# Patient Record
Sex: Male | Born: 1935 | Race: Black or African American | Hispanic: No | Marital: Married | Smoking: Current every day smoker
Health system: Southern US, Community
[De-identification: ages and names within clinical notes are randomized; demographics above are authoritative.]

## PROBLEM LIST (undated history)

## (undated) DIAGNOSIS — R590 Localized enlarged lymph nodes: Secondary | ICD-10-CM

## (undated) DIAGNOSIS — N189 Chronic kidney disease, unspecified: Secondary | ICD-10-CM

## (undated) DIAGNOSIS — N1330 Unspecified hydronephrosis: Principal | ICD-10-CM

## (undated) DIAGNOSIS — C61 Malignant neoplasm of prostate: Secondary | ICD-10-CM

## (undated) DIAGNOSIS — I422 Other hypertrophic cardiomyopathy: Secondary | ICD-10-CM

## (undated) DIAGNOSIS — E785 Hyperlipidemia, unspecified: Secondary | ICD-10-CM

## (undated) DIAGNOSIS — R911 Solitary pulmonary nodule: Secondary | ICD-10-CM

## (undated) DIAGNOSIS — Z7901 Long term (current) use of anticoagulants: Secondary | ICD-10-CM

## (undated) DIAGNOSIS — I10 Essential (primary) hypertension: Secondary | ICD-10-CM

## (undated) DIAGNOSIS — I872 Venous insufficiency (chronic) (peripheral): Secondary | ICD-10-CM

## (undated) HISTORY — DX: Hyperlipidemia, unspecified: E78.5

## (undated) HISTORY — DX: Malignant neoplasm of prostate: C61

## (undated) HISTORY — DX: Localized enlarged lymph nodes: R59.0

## (undated) HISTORY — DX: Unspecified hydronephrosis: N13.30

## (undated) HISTORY — DX: Solitary pulmonary nodule: R91.1

## (undated) HISTORY — DX: Long term (current) use of anticoagulants: Z79.01

## (undated) HISTORY — DX: Venous insufficiency (chronic) (peripheral): I87.2

## (undated) HISTORY — DX: Other hypertrophic cardiomyopathy: I42.2

## (undated) HISTORY — DX: Essential (primary) hypertension: I10

## (undated) HISTORY — DX: Chronic kidney disease, unspecified: N18.9

---

## 1998-01-22 ENCOUNTER — Encounter (HOSPITAL_COMMUNITY): Admission: RE | Admit: 1998-01-22 | Discharge: 1998-04-22 | Payer: Self-pay | Admitting: Cardiology

## 1998-04-14 ENCOUNTER — Emergency Department (HOSPITAL_COMMUNITY): Admission: EM | Admit: 1998-04-14 | Discharge: 1998-04-14 | Payer: Self-pay | Admitting: Emergency Medicine

## 1999-04-29 ENCOUNTER — Emergency Department (HOSPITAL_COMMUNITY): Admission: EM | Admit: 1999-04-29 | Discharge: 1999-04-29 | Payer: Self-pay | Admitting: Emergency Medicine

## 2000-02-27 ENCOUNTER — Encounter (INDEPENDENT_AMBULATORY_CARE_PROVIDER_SITE_OTHER): Payer: Self-pay

## 2000-02-27 ENCOUNTER — Other Ambulatory Visit: Admission: RE | Admit: 2000-02-27 | Discharge: 2000-02-27 | Payer: Self-pay | Admitting: Urology

## 2004-03-18 ENCOUNTER — Ambulatory Visit: Payer: Self-pay

## 2004-04-15 ENCOUNTER — Ambulatory Visit: Payer: Self-pay | Admitting: Cardiology

## 2004-05-13 ENCOUNTER — Ambulatory Visit: Payer: Self-pay | Admitting: Cardiovascular Disease

## 2004-06-10 ENCOUNTER — Ambulatory Visit: Payer: Self-pay | Admitting: Cardiology

## 2004-07-01 ENCOUNTER — Ambulatory Visit: Payer: Self-pay | Admitting: Cardiology

## 2004-07-29 ENCOUNTER — Ambulatory Visit: Payer: Self-pay | Admitting: Cardiology

## 2004-08-26 ENCOUNTER — Ambulatory Visit: Payer: Self-pay | Admitting: Internal Medicine

## 2004-09-23 ENCOUNTER — Ambulatory Visit: Payer: Self-pay | Admitting: Cardiology

## 2004-10-21 ENCOUNTER — Ambulatory Visit: Payer: Self-pay | Admitting: Cardiology

## 2004-10-28 ENCOUNTER — Ambulatory Visit: Payer: Self-pay | Admitting: Cardiology

## 2004-11-16 ENCOUNTER — Ambulatory Visit (HOSPITAL_COMMUNITY): Admission: RE | Admit: 2004-11-16 | Discharge: 2004-11-16 | Payer: Self-pay | Admitting: Urology

## 2004-11-18 ENCOUNTER — Ambulatory Visit: Payer: Self-pay | Admitting: Cardiovascular Disease

## 2004-12-16 ENCOUNTER — Ambulatory Visit: Payer: Self-pay | Admitting: Internal Medicine

## 2005-01-13 ENCOUNTER — Ambulatory Visit: Payer: Self-pay | Admitting: Internal Medicine

## 2005-02-10 ENCOUNTER — Ambulatory Visit: Payer: Self-pay | Admitting: Internal Medicine

## 2005-03-10 ENCOUNTER — Ambulatory Visit: Payer: Self-pay | Admitting: Cardiology

## 2005-04-07 ENCOUNTER — Ambulatory Visit: Payer: Self-pay | Admitting: Cardiology

## 2005-05-05 ENCOUNTER — Ambulatory Visit: Payer: Self-pay | Admitting: Cardiology

## 2005-06-02 ENCOUNTER — Ambulatory Visit: Payer: Self-pay | Admitting: Cardiology

## 2005-06-30 ENCOUNTER — Ambulatory Visit: Payer: Self-pay | Admitting: Cardiology

## 2005-07-28 ENCOUNTER — Ambulatory Visit: Payer: Self-pay | Admitting: Cardiology

## 2005-08-25 ENCOUNTER — Ambulatory Visit: Payer: Self-pay | Admitting: Cardiology

## 2005-09-22 ENCOUNTER — Ambulatory Visit: Payer: Self-pay | Admitting: Cardiology

## 2005-10-20 ENCOUNTER — Ambulatory Visit: Payer: Self-pay | Admitting: Cardiovascular Disease

## 2005-11-17 ENCOUNTER — Ambulatory Visit: Payer: Self-pay | Admitting: Cardiology

## 2005-12-15 ENCOUNTER — Ambulatory Visit: Payer: Self-pay | Admitting: Cardiology

## 2006-01-12 ENCOUNTER — Ambulatory Visit: Payer: Self-pay | Admitting: Internal Medicine

## 2006-02-16 ENCOUNTER — Ambulatory Visit: Payer: Self-pay | Admitting: Cardiology

## 2006-03-09 ENCOUNTER — Ambulatory Visit: Payer: Self-pay | Admitting: Cardiology

## 2006-04-06 ENCOUNTER — Ambulatory Visit: Payer: Self-pay | Admitting: Cardiology

## 2006-05-04 ENCOUNTER — Ambulatory Visit: Payer: Self-pay | Admitting: Cardiology

## 2006-06-01 ENCOUNTER — Ambulatory Visit: Payer: Self-pay | Admitting: Internal Medicine

## 2006-06-28 ENCOUNTER — Ambulatory Visit: Payer: Self-pay | Admitting: *Deleted

## 2006-07-26 ENCOUNTER — Ambulatory Visit: Payer: Self-pay | Admitting: Cardiology

## 2006-08-23 ENCOUNTER — Ambulatory Visit: Payer: Self-pay | Admitting: Cardiology

## 2006-09-16 ENCOUNTER — Ambulatory Visit: Payer: Self-pay | Admitting: Cardiology

## 2006-09-16 ENCOUNTER — Inpatient Hospital Stay (HOSPITAL_COMMUNITY): Admission: EM | Admit: 2006-09-16 | Discharge: 2006-09-18 | Payer: Self-pay | Admitting: Emergency Medicine

## 2006-09-17 ENCOUNTER — Encounter: Payer: Self-pay | Admitting: Cardiology

## 2006-09-20 ENCOUNTER — Ambulatory Visit: Payer: Self-pay | Admitting: Cardiology

## 2006-09-28 ENCOUNTER — Ambulatory Visit: Payer: Self-pay

## 2006-09-28 ENCOUNTER — Ambulatory Visit: Payer: Self-pay | Admitting: Cardiology

## 2006-10-04 ENCOUNTER — Ambulatory Visit: Payer: Self-pay | Admitting: Cardiovascular Disease

## 2006-10-17 ENCOUNTER — Ambulatory Visit: Payer: Self-pay | Admitting: Cardiology

## 2006-10-31 ENCOUNTER — Ambulatory Visit: Payer: Self-pay | Admitting: Cardiology

## 2006-11-21 ENCOUNTER — Ambulatory Visit: Payer: Self-pay | Admitting: Cardiology

## 2006-12-19 ENCOUNTER — Ambulatory Visit: Payer: Self-pay | Admitting: Cardiology

## 2007-01-16 ENCOUNTER — Ambulatory Visit: Payer: Self-pay | Admitting: Internal Medicine

## 2007-02-13 ENCOUNTER — Ambulatory Visit: Payer: Self-pay | Admitting: Cardiovascular Disease

## 2007-03-13 ENCOUNTER — Ambulatory Visit: Payer: Self-pay | Admitting: Cardiology

## 2007-04-10 ENCOUNTER — Ambulatory Visit: Payer: Self-pay | Admitting: Cardiology

## 2007-04-17 ENCOUNTER — Ambulatory Visit: Payer: Self-pay | Admitting: Cardiovascular Disease

## 2007-04-30 ENCOUNTER — Ambulatory Visit: Payer: Self-pay | Admitting: Internal Medicine

## 2007-05-14 ENCOUNTER — Ambulatory Visit: Payer: Self-pay | Admitting: Cardiovascular Disease

## 2007-06-04 ENCOUNTER — Ambulatory Visit: Payer: Self-pay | Admitting: Cardiovascular Disease

## 2007-06-25 ENCOUNTER — Ambulatory Visit: Payer: Self-pay | Admitting: Cardiology

## 2007-07-09 ENCOUNTER — Ambulatory Visit: Payer: Self-pay | Admitting: Cardiology

## 2007-07-12 ENCOUNTER — Ambulatory Visit: Payer: Self-pay | Admitting: Cardiology

## 2007-07-12 ENCOUNTER — Ambulatory Visit: Payer: Self-pay

## 2007-07-30 ENCOUNTER — Ambulatory Visit: Payer: Self-pay | Admitting: Internal Medicine

## 2007-08-08 ENCOUNTER — Ambulatory Visit: Payer: Self-pay | Admitting: Internal Medicine

## 2007-08-22 ENCOUNTER — Ambulatory Visit: Payer: Self-pay | Admitting: Internal Medicine

## 2007-09-09 ENCOUNTER — Ambulatory Visit: Payer: Self-pay | Admitting: Cardiology

## 2007-10-04 ENCOUNTER — Ambulatory Visit: Payer: Self-pay | Admitting: Cardiovascular Disease

## 2007-10-30 ENCOUNTER — Ambulatory Visit: Payer: Self-pay | Admitting: Internal Medicine

## 2007-11-06 ENCOUNTER — Ambulatory Visit: Payer: Self-pay | Admitting: Internal Medicine

## 2007-11-13 ENCOUNTER — Ambulatory Visit: Payer: Self-pay | Admitting: Cardiology

## 2007-11-22 ENCOUNTER — Ambulatory Visit: Payer: Self-pay | Admitting: Cardiology

## 2007-12-06 ENCOUNTER — Ambulatory Visit: Payer: Self-pay | Admitting: Cardiology

## 2007-12-06 LAB — CONVERTED CEMR LAB: INR: 5.5 (ref 0.8–1.0)

## 2007-12-11 ENCOUNTER — Ambulatory Visit: Payer: Self-pay | Admitting: Cardiology

## 2007-12-18 ENCOUNTER — Ambulatory Visit: Payer: Self-pay | Admitting: Cardiology

## 2007-12-30 ENCOUNTER — Ambulatory Visit: Payer: Self-pay | Admitting: Cardiology

## 2008-01-07 ENCOUNTER — Ambulatory Visit: Payer: Self-pay | Admitting: Cardiology

## 2008-01-07 ENCOUNTER — Encounter: Admission: RE | Admit: 2008-01-07 | Discharge: 2008-01-07 | Payer: Self-pay | Admitting: General Surgery

## 2008-01-14 ENCOUNTER — Ambulatory Visit: Payer: Self-pay | Admitting: Cardiology

## 2008-01-21 ENCOUNTER — Ambulatory Visit: Payer: Self-pay | Admitting: Cardiology

## 2008-01-28 ENCOUNTER — Ambulatory Visit: Payer: Self-pay | Admitting: Cardiology

## 2008-02-07 ENCOUNTER — Ambulatory Visit: Payer: Self-pay | Admitting: Cardiology

## 2008-02-14 ENCOUNTER — Ambulatory Visit: Payer: Self-pay | Admitting: Cardiology

## 2008-02-24 ENCOUNTER — Ambulatory Visit: Payer: Self-pay | Admitting: Cardiology

## 2008-03-02 ENCOUNTER — Ambulatory Visit: Payer: Self-pay | Admitting: Cardiovascular Disease

## 2008-03-09 ENCOUNTER — Ambulatory Visit: Payer: Self-pay | Admitting: Cardiology

## 2008-03-18 ENCOUNTER — Ambulatory Visit: Payer: Self-pay | Admitting: Cardiology

## 2008-03-27 ENCOUNTER — Ambulatory Visit: Payer: Self-pay | Admitting: Internal Medicine

## 2008-04-03 ENCOUNTER — Ambulatory Visit: Payer: Self-pay | Admitting: Internal Medicine

## 2008-04-14 ENCOUNTER — Ambulatory Visit: Payer: Self-pay | Admitting: Internal Medicine

## 2008-04-28 ENCOUNTER — Ambulatory Visit: Payer: Self-pay | Admitting: Cardiovascular Disease

## 2008-05-08 ENCOUNTER — Ambulatory Visit: Payer: Self-pay | Admitting: Cardiology

## 2008-05-22 ENCOUNTER — Ambulatory Visit: Payer: Self-pay | Admitting: Internal Medicine

## 2008-06-01 ENCOUNTER — Ambulatory Visit: Payer: Self-pay | Admitting: Cardiology

## 2008-06-15 ENCOUNTER — Ambulatory Visit: Payer: Self-pay | Admitting: Internal Medicine

## 2008-06-29 ENCOUNTER — Ambulatory Visit: Payer: Self-pay | Admitting: Cardiology

## 2008-07-07 DIAGNOSIS — I421 Obstructive hypertrophic cardiomyopathy: Secondary | ICD-10-CM | POA: Insufficient documentation

## 2008-07-08 ENCOUNTER — Encounter: Payer: Self-pay | Admitting: Cardiology

## 2008-07-08 ENCOUNTER — Ambulatory Visit: Payer: Self-pay | Admitting: Cardiology

## 2008-07-08 LAB — CONVERTED CEMR LAB
BUN: 17 mg/dL (ref 6–23)
Basophils Relative: 0.5 % (ref 0.0–3.0)
CO2: 29 meq/L (ref 19–32)
Calcium: 9.5 mg/dL (ref 8.4–10.5)
Chloride: 105 meq/L (ref 96–112)
Creatinine, Ser: 1.1 mg/dL (ref 0.4–1.5)
Eosinophils Absolute: 0.7 10*3/uL (ref 0.0–0.7)
GFR calc Af Amer: 83 mL/min
GFR calc non Af Amer: 69 mL/min
HCT: 35.2 % — ABNORMAL LOW (ref 39.0–52.0)
Hemoglobin: 11.8 g/dL — ABNORMAL LOW (ref 13.0–17.0)
Lymphocytes Relative: 19.7 % (ref 12.0–46.0)
MCHC: 33.4 g/dL (ref 30.0–36.0)
MCV: 98.6 fL (ref 78.0–100.0)
Sodium: 141 meq/L (ref 135–145)
TSH: 1.78 microintl units/mL (ref 0.35–5.50)
WBC: 10.3 10*3/uL (ref 4.5–10.5)

## 2008-07-13 ENCOUNTER — Ambulatory Visit: Payer: Self-pay | Admitting: Cardiology

## 2008-08-03 ENCOUNTER — Ambulatory Visit: Payer: Self-pay | Admitting: Cardiology

## 2008-08-12 ENCOUNTER — Ambulatory Visit: Payer: Self-pay | Admitting: Internal Medicine

## 2008-08-24 ENCOUNTER — Ambulatory Visit: Payer: Self-pay | Admitting: Cardiology

## 2008-09-07 ENCOUNTER — Ambulatory Visit: Payer: Self-pay | Admitting: Cardiovascular Disease

## 2008-09-21 ENCOUNTER — Ambulatory Visit: Payer: Self-pay | Admitting: Cardiovascular Disease

## 2008-09-29 ENCOUNTER — Encounter: Payer: Self-pay | Admitting: *Deleted

## 2008-09-30 ENCOUNTER — Ambulatory Visit: Payer: Self-pay | Admitting: Cardiology

## 2008-09-30 LAB — CONVERTED CEMR LAB: Protime: 17.6

## 2008-10-14 ENCOUNTER — Ambulatory Visit: Payer: Self-pay | Admitting: Cardiology

## 2008-10-26 ENCOUNTER — Ambulatory Visit: Payer: Self-pay | Admitting: Cardiology

## 2008-11-04 ENCOUNTER — Encounter: Payer: Self-pay | Admitting: *Deleted

## 2008-11-04 ENCOUNTER — Ambulatory Visit: Payer: Self-pay | Admitting: Internal Medicine

## 2008-11-04 LAB — CONVERTED CEMR LAB: Prothrombin Time: 16.5 s

## 2008-11-18 ENCOUNTER — Ambulatory Visit: Payer: Self-pay | Admitting: Cardiology

## 2008-11-18 LAB — CONVERTED CEMR LAB
POC INR: 2.1
Prothrombin Time: 17.8 s

## 2008-12-03 ENCOUNTER — Ambulatory Visit: Payer: Self-pay | Admitting: Cardiology

## 2008-12-03 LAB — CONVERTED CEMR LAB: POC INR: 2

## 2008-12-14 ENCOUNTER — Encounter: Payer: Self-pay | Admitting: Cardiovascular Disease

## 2008-12-18 ENCOUNTER — Ambulatory Visit: Payer: Self-pay | Admitting: Cardiology

## 2008-12-29 ENCOUNTER — Ambulatory Visit: Payer: Self-pay | Admitting: Cardiology

## 2008-12-29 LAB — CONVERTED CEMR LAB: POC INR: 2.3

## 2009-01-12 ENCOUNTER — Ambulatory Visit: Payer: Self-pay | Admitting: Cardiology

## 2009-02-02 ENCOUNTER — Ambulatory Visit: Payer: Self-pay | Admitting: Cardiology

## 2009-03-02 ENCOUNTER — Ambulatory Visit: Payer: Self-pay | Admitting: Internal Medicine

## 2009-03-02 LAB — CONVERTED CEMR LAB: POC INR: 1.5

## 2009-03-19 ENCOUNTER — Ambulatory Visit: Payer: Self-pay | Admitting: Cardiology

## 2009-03-19 LAB — CONVERTED CEMR LAB: POC INR: 2.1

## 2009-04-14 ENCOUNTER — Ambulatory Visit: Payer: Self-pay | Admitting: Cardiology

## 2009-05-05 ENCOUNTER — Encounter (INDEPENDENT_AMBULATORY_CARE_PROVIDER_SITE_OTHER): Payer: Self-pay | Admitting: Cardiology

## 2009-05-05 ENCOUNTER — Ambulatory Visit: Payer: Self-pay | Admitting: Cardiovascular Disease

## 2009-05-26 ENCOUNTER — Ambulatory Visit: Payer: Self-pay | Admitting: Cardiology

## 2009-06-09 ENCOUNTER — Ambulatory Visit: Payer: Self-pay | Admitting: Cardiology

## 2009-06-23 ENCOUNTER — Ambulatory Visit: Payer: Self-pay | Admitting: Internal Medicine

## 2009-06-23 LAB — CONVERTED CEMR LAB: POC INR: 2.8

## 2009-07-13 ENCOUNTER — Ambulatory Visit: Payer: Self-pay | Admitting: Cardiology

## 2009-07-13 DIAGNOSIS — L259 Unspecified contact dermatitis, unspecified cause: Secondary | ICD-10-CM

## 2009-07-13 LAB — CONVERTED CEMR LAB: POC INR: 3.1

## 2009-07-15 LAB — CONVERTED CEMR LAB
AST: 19 units/L (ref 0–37)
BUN: 15 mg/dL (ref 6–23)
Calcium: 9.4 mg/dL (ref 8.4–10.5)
Creatinine, Ser: 1.7 mg/dL — ABNORMAL HIGH (ref 0.4–1.5)
Eosinophils Relative: 4.5 % (ref 0.0–5.0)
HCT: 34.7 % — ABNORMAL LOW (ref 39.0–52.0)
Hemoglobin: 11.4 g/dL — ABNORMAL LOW (ref 13.0–17.0)
Lymphocytes Relative: 15.5 % (ref 12.0–46.0)
Lymphs Abs: 1.5 10*3/uL (ref 0.7–4.0)
MCHC: 32.9 g/dL (ref 30.0–36.0)
Neutro Abs: 6.4 10*3/uL (ref 1.4–7.7)
RBC: 3.48 M/uL — ABNORMAL LOW (ref 4.22–5.81)
RDW: 13 % (ref 11.5–14.6)
Sodium: 137 meq/L (ref 135–145)
Total Bilirubin: 0.4 mg/dL (ref 0.3–1.2)
WBC: 9.6 10*3/uL (ref 4.5–10.5)

## 2009-08-03 ENCOUNTER — Ambulatory Visit: Payer: Self-pay | Admitting: Internal Medicine

## 2009-08-03 LAB — CONVERTED CEMR LAB: POC INR: 1.7

## 2009-08-17 ENCOUNTER — Ambulatory Visit: Payer: Self-pay | Admitting: Cardiovascular Disease

## 2009-09-14 ENCOUNTER — Ambulatory Visit: Payer: Self-pay | Admitting: Internal Medicine

## 2009-09-14 LAB — CONVERTED CEMR LAB: POC INR: 2.9

## 2009-10-12 ENCOUNTER — Ambulatory Visit: Payer: Self-pay | Admitting: Cardiovascular Disease

## 2009-11-03 ENCOUNTER — Ambulatory Visit: Payer: Self-pay | Admitting: Internal Medicine

## 2009-11-03 LAB — CONVERTED CEMR LAB: POC INR: 3.7

## 2009-11-17 ENCOUNTER — Ambulatory Visit: Payer: Self-pay | Admitting: Cardiology

## 2009-12-08 ENCOUNTER — Ambulatory Visit: Payer: Self-pay | Admitting: Cardiology

## 2009-12-31 ENCOUNTER — Ambulatory Visit: Payer: Self-pay | Admitting: Cardiology

## 2010-01-28 ENCOUNTER — Ambulatory Visit: Payer: Self-pay | Admitting: Cardiology

## 2010-01-28 LAB — CONVERTED CEMR LAB: POC INR: 3.2

## 2010-02-16 ENCOUNTER — Ambulatory Visit: Payer: Self-pay | Admitting: Internal Medicine

## 2010-02-16 LAB — CONVERTED CEMR LAB
INR: 3.5
POC INR: 3.5

## 2010-03-09 ENCOUNTER — Ambulatory Visit: Payer: Self-pay | Admitting: Cardiovascular Disease

## 2010-03-30 ENCOUNTER — Ambulatory Visit: Payer: Self-pay | Admitting: Internal Medicine

## 2010-03-30 LAB — CONVERTED CEMR LAB: POC INR: 3.1

## 2010-04-27 ENCOUNTER — Ambulatory Visit: Payer: Self-pay | Admitting: Cardiology

## 2010-04-27 LAB — CONVERTED CEMR LAB: POC INR: 2.8

## 2010-05-25 ENCOUNTER — Ambulatory Visit: Admission: RE | Admit: 2010-05-25 | Discharge: 2010-05-25 | Payer: Self-pay | Source: Home / Self Care

## 2010-05-31 NOTE — Assessment & Plan Note (Signed)
Summary: 1 yr    Primary Provider:  Dr. Filbert Schilder  CC:  check up.  History of Present Illness: Roy Blackwell is 77 years old and return for management of hypertrophic cardiomyopathy and venous insufficiency. He has a mild hypertrophic heart myopathy by echocardiogram with an abnormal ECG. He has not had any symptoms related to this and specifically has had no chest pain shortness of breath or palpitations.  His major problem has been severe venous insufficiency of the lower extremities. This is managed by Dr. Zachery Dakins with diuretics and Ace wraps. He is doing reasonably well with this although he does have small ulcers.  His other major problem is a skin eruption. This has gotten much worse recently. I believe this has been diagnosed as a form of eczema. He seen Dr. Mayford Knife in the past for this.  Current Medications (verified): 1)  Coumadin 5 Mg Tabs (Warfarin Sodium) .... Take As Directed By Coumadin Clinic. 2)  Sulfamethoxazole-Tmp Ds 800-160 Mg Tabs (Sulfamethoxazole-Trimethoprim) .Marland Kitchen.. 1 By Mouth Bid 3)  Furosemide 20 Mg Tabs (Furosemide) .... Take 1 Tablet By Mouth Daily.  Allergies: No Known Drug Allergies  Past History:  Past Medical History: Reviewed history from 07/07/2008 and no changes required. Chronic venous insufficiency and stasis ulcers of both lower     extremities.  Mild hypertrophic cardiomyopathy.   Review of Systems       ROS is negative except as outlined in HPI.   Vital Signs:  Patient profile:   77 year old male Height:      68 inches Weight:      154.75 pounds BMI:     23.61 Pulse rate:   64 / minute Resp:     14 per minute BP sitting:   137 / 62  (left arm)  Vitals Entered By: Kem Parkinson (July 13, 2009 11:17 AM)  Physical Exam  Additional Exam:  Gen. Well-nourished, in no distress   Neck: No JVD, thyroid not enlarged, no carotid bruits Lungs: No tachypnea, clear without rales, rhonchi or wheezes Cardiovascular: Rhythm regular, PMI not  displaced,  heart sounds  normal, no murmurs or gallops, there are wraps on both lower extremities with some oozing through the wraps of ulcers but only trace edema, pulses normal in all 4 extremities. Abdomen: BS normal, abdomen soft and non-tender without masses or organomegaly, no hepatosplenomegaly. MS: No deformities, no cyanosis or clubbing   Neuro:  No focal sns   Skin:  generalized maculopapular rash over her extremities and trunk with  changes in pigmentation   Impression & Recommendations:  Problem # 1:  HOCM / IHSS (ICD-425.1) He has a mild hypertrophic cardiomyopathy. He has had no symptoms from this. He does have an abnormal ECG with anterolateral T wave changes but this hasn't changed. We will continue current management. His updated medication list for this problem includes:    Coumadin 5 Mg Tabs (Warfarin sodium) .Marland Kitchen... Take as directed by coumadin clinic.    Furosemide 20 Mg Tabs (Furosemide) .Marland Kitchen... Take 1 tablet by mouth daily.  Orders: EKG w/ Interpretation (93000) TLB-BMP (Basic Metabolic Panel-BMET) (80048-METABOL) TLB-CBC Platelet - w/Differential (85025-CBCD) TLB-Hepatic/Liver Function Pnl (80076-HEPATIC) TLB-TSH (Thyroid Stimulating Hormone) (84443-TSH)  Problem # 2:  VENOUS INSUFFICIENCY, LEGS (ICD-459.81) This is his major problem. Dr. Zachery Dakins is doing an excellent job of managing this. We did not make any changes. We will get laboratory work today since he is on a diuretic. Orders: TLB-BMP (Basic Metabolic Panel-BMET) (80048-METABOL) TLB-CBC Platelet - w/Differential (85025-CBCD) TLB-Hepatic/Liver Function  Pnl (80076-HEPATIC) TLB-TSH (Thyroid Stimulating Hormone) (84443-TSH)  Problem # 3:  CONTACT DERMATITIS&OTHER ECZEMA DUE UNSPEC CAUSE (ICD-692.9) His skin rash is much worse. We will arrange a follow up appointment with Dr. Mayford Knife. Orders: TLB-BMP (Basic Metabolic Panel-BMET) (80048-METABOL) TLB-CBC Platelet - w/Differential  (85025-CBCD) TLB-Hepatic/Liver Function Pnl (80076-HEPATIC) TLB-TSH (Thyroid Stimulating Hormone) (16109-UEA)  Patient Instructions: 1)  Your physician recommends that you have lab work today: cbc/bmet/lft's/tsh (425.1) 2)  Your physician wants you to follow-up in: 1 year with Dr. [name].  You will receive a reminder letter in the mail two months in advance. If you don't receive a letter, please call our office to schedule the follow-up appointment. 3)  You have an appointment scheduled for Tues. 07/27/09 at 12:20pm with Dr. Dorinda Hill. His office number is [phone].

## 2010-05-31 NOTE — Medication Information (Signed)
Summary: rov/sp   Anticoagulant Therapy  Managed by: Weston Brass, PharmD Referring MD: Charlies Constable MD PCP: Dr. Carola Frost MD: Graciela Husbands MD, Viviann Spare Indication 1: Deep Vein Thrombosis - Leg (ICD-451.1) Lab Used: [facility] Site: [facility] INR POC 3.5 INR RANGE 2 - 3  Dietary changes: no    Health status changes: no    Bleeding/hemorrhagic complications: no    Recent/future hospitalizations: no    Any changes in medication regimen? no    Recent/future dental: no  Any missed doses?: no       Is patient compliant with meds? yes       Allergies: No Known Drug Allergies  Anticoagulation Management History:      The patient is taking warfarin and comes in today for a routine follow up visit.  Positive risk factors for bleeding include an age of 75 years or older and presence of serious comorbidities.  The bleeding index is 'intermediate risk'.  Positive CHADS2 values include Age > 57 years old.  The start date was 08/31/1997.  His last INR was 5.5 RATIO and today's INR is 3.5.  Anticoagulation responsible provider: Graciela Husbands MD, Viviann Spare.  INR POC: 3.5.  Cuvette Lot#: [id number].  Exp: 03/2011.    Anticoagulation Management Assessment/Plan:      The patient's current anticoagulation dose is Coumadin 5 mg tabs: Take as directed by coumadin clinic..  The target INR is 2.0-3.0.  The next INR is due 03/09/2010.  Anticoagulation instructions were given to patient.  Results were reviewed/authorized by Weston Brass, PharmD.  He was notified by Haynes Hoehn, PharmD Candidate.         Prior Anticoagulation Instructions: INR 3.2  Skip today's dose of Coumadin then resume same dose of 1 tablet every day except 1 1/2 tablets on Tuesday and Saturday.  Recheck INR in 3 weeks.   Current Anticoagulation Instructions: INR 3.5  Skip today's dose. Then take Coumadin 1 tablet every day of the week, except 1 and 1/2 tablets on Saturday.  Return to clinic in 3 weeks.

## 2010-05-31 NOTE — Medication Information (Signed)
Summary: ROV/SP  Anticoagulant Therapy  Managed by: Weston Brass, PharmD Referring MD: Charlies Constable MD PCP: Dr. Carola Frost MD: Johney Frame MD, Fayrene Fearing Indication 1: Deep Vein Thrombosis - Leg (ICD-451.1) Lab Used: [facility] Site: [facility] INR POC 2.6 INR RANGE 2 - 3  Dietary changes: no    Health status changes: no    Bleeding/hemorrhagic complications: no    Recent/future hospitalizations: no    Any changes in medication regimen? no    Recent/future dental: no  Any missed doses?: no       Is patient compliant with meds? yes       Allergies: No Known Drug Allergies  Anticoagulation Management History:      Positive risk factors for bleeding include an age of 75 years or older and presence of serious comorbidities.  The bleeding index is 'intermediate risk'.  Positive CHADS2 values include Age > 37 years old.  The start date was 08/31/1997.  His last INR was 5.5 RATIO.  Anticoagulation responsible provider: Allred MD, Fayrene Fearing.  INR POC: 2.6.  Exp: 12/2010.    Anticoagulation Management Assessment/Plan:      The patient's current anticoagulation dose is Coumadin 5 mg tabs: Take as directed by coumadin clinic..  The target INR is 2.0-3.0.  The next INR is due 12/08/2009.  Anticoagulation instructions were given to patient.  Results were reviewed/authorized by Weston Brass, PharmD.  He was notified by Weston Brass PharmD.         Prior Anticoagulation Instructions: INR 3.7  Hold Coumadin dose today.  Change to 1 tab on Thursday.  On Tues and Sat take 1.5 tabs.  Take 1 tab all other days.      Current Anticoagulation Instructions: INR 2.6  Continue same dose of 1 tablet every day except 1 1/2 tablets on Tuesday and Saturday.

## 2010-05-31 NOTE — Medication Information (Signed)
Summary: Roy Blackwell  Anticoagulant Therapy  Managed by: Bethena Midget, RN, BSN Referring MD: Charlies Constable MD PCP: Dr. Carola Frost MD: Graciela Husbands MD, Viviann Spare Indication 1: Deep Vein Thrombosis - Leg (ICD-451.1) Lab Used: [facility]  Site: [facility] INR POC 1.7 INR RANGE 2 - 3  Dietary changes: no    Health status changes: no    Bleeding/hemorrhagic complications: no    Recent/future hospitalizations: no    Any changes in medication regimen? yes       Details: see med  list  Recent/future dental: no  Any missed doses?: no       Is patient compliant with meds? yes       Current Medications (verified): 1)  Coumadin 5 Mg Tabs (Warfarin Sodium) .... Take As Directed By Coumadin Clinic. 2)  Sulfamethoxazole-Tmp Ds 800-160 Mg Tabs (Sulfamethoxazole-Trimethoprim) .Marland Kitchen.. 1 By Mouth Bid 3)  Furosemide 20 Mg Tabs (Furosemide) .... Take 1 Tablet By Mouth Daily. 4)  Clobetasol Propionate 0.05 % Oint (Clobetasol Propionate) .... Apply Emollient Prn 5)  Triamcinolone Acetonide 0.1 % Crea (Triamcinolone Acetonide) .... Apply Daily On Chest & Back  Allergies (verified): No Known Drug Allergies  Anticoagulation Management History:      The patient is taking warfarin and comes in today for a routine follow up visit.  Positive risk factors for bleeding include an age of 30 years or older and presence of serious comorbidities.  The bleeding index is 'intermediate risk'.  Positive CHADS2 values include Age > 55 years old.  The start date was 08/31/1997.  His last INR was 5.5 RATIO.  Anticoagulation responsible provider: Graciela Husbands MD, Viviann Spare.  INR POC: 1.7.  Cuvette Lot#: [id number].  Exp: 08/2009.    Anticoagulation Management Assessment/Plan:      The patient's current anticoagulation dose is Coumadin 5 mg tabs: Take as directed by coumadin clinic..  The target INR is 2.0-3.0.  The next INR is due 08/17/2009.  Anticoagulation instructions were given to patient.  Results were reviewed/authorized by  Bethena Midget, RN, BSN.  He was notified by Bethena Midget, RN, BSN.         Prior Anticoagulation Instructions: INR 3.1  Start taking 1 tablet daily except 1.5 tablets on Mondays and Fridays.  Recheck in 3 weeks.    Current Anticoagulation Instructions: INR 1.7 Today take 7.5mg s then change dose to 5mg s daily except 7.5mg s on Tuesdays, Thursdays and Saturdays. Recheck in 2 weeks.

## 2010-05-31 NOTE — Medication Information (Signed)
Summary: rov/ez  Anticoagulant Therapy  Managed by: Eda Keys, PharmD Referring MD: Charlies Constable MD Supervising MD: Jens Som MD, Arlys [name] Indication 1: Deep Vein Thrombosis - Leg (ICD-451.1) Lab Used: [facility] Site: [facility] INR POC 1.8 INR RANGE 2 - 3  Dietary changes: yes       Details: back to normal amt of greens  Health status changes: no    Bleeding/hemorrhagic complications: no    Recent/future hospitalizations: no    Any changes in medication regimen? no    Recent/future dental: no  Any missed doses?: no       Is patient compliant with meds? yes       Allergies: No Known Drug Allergies  Anticoagulation Management History:      The patient is taking warfarin and comes in today for a routine follow up visit.  Positive risk factors for bleeding include an age of 6 years or older.  The bleeding index is 'intermediate risk'.  Positive CHADS2 values include Age > 31 years old.  The start date was 08/31/1997.  His last INR was 5.5 RATIO.  Anticoagulation responsible provider: Jens Som MD, Arlys [name].  INR POC: 1.8.  Cuvette Lot#: [id number].  Exp: 07/2010.    Anticoagulation Management Assessment/Plan:      The patient's current anticoagulation dose is Coumadin 5 mg tabs: Take as directed by coumadin clinic..  The target INR is 2.0-3.0.  The next INR is due 06/23/2009.  Anticoagulation instructions were given to patient.  Results were reviewed/authorized by Eda Keys, PharmD.  He was notified by Eda Keys.         Prior Anticoagulation Instructions: INR: 1.5 Take 1 and 1/2 tablets by mouth today and tomorrow then change dose to 1 tablet daily except 1 and 1/2 tablets on Wednesdays and Saturdays Recheck in 2 weeks  Current Anticoagulation Instructions: INR 1.8  Take 2 tablets today.  Then start NEW dosing schedule of 1.5 tablets on Monday, Wednesday, and Friday, and take 1 tablet all other days.  Return to clinic in 2 weeks.

## 2010-05-31 NOTE — Medication Information (Signed)
Summary: rov/jaj   Anticoagulant Therapy  Managed by: Weston Brass, PharmD Referring MD: Charlies Constable MD PCP: Dr. Carola Frost MD: Juanda Chance MD, Bruce Indication 1: Deep Vein Thrombosis - Leg (ICD-451.1) Lab Used: [facility] Site: [facility] INR POC 3.2 INR RANGE 2 - 3  Dietary changes: no    Health status changes: no    Bleeding/hemorrhagic complications: no    Recent/future hospitalizations: no    Any changes in medication regimen? no    Recent/future dental: no  Any missed doses?: no       Is patient compliant with meds? yes       Allergies: No Known Drug Allergies  Anticoagulation Management History:      The patient is taking warfarin and comes in today for a routine follow up visit.  Positive risk factors for bleeding include an age of 75 years or older and presence of serious comorbidities.  The bleeding index is 'intermediate risk'.  Positive CHADS2 values include Age > 75 years old.  The start date was 08/31/1997.  His last INR was 5.5 RATIO.  Anticoagulation responsible provider: Juanda Chance MD, Smitty Cords.  INR POC: 3.2.  Cuvette Lot#: [id number].  Exp: 03/2011.    Anticoagulation Management Assessment/Plan:      The patient's current anticoagulation dose is Coumadin 5 mg tabs: Take as directed by coumadin clinic..  The target INR is 2.0-3.0.  The next INR is due 02/16/2010.  Anticoagulation instructions were given to patient.  Results were reviewed/authorized by Weston Brass, PharmD.  He was notified by Weston Brass PharmD.         Prior Anticoagulation Instructions: INR 2.6  Continue taking regular schedule of 1 tablet every day except take 1 1/2 tablets on Tuesdays and Saturdays. Re-check INR in 4 weeks.   Current Anticoagulation Instructions: INR 3.2  Skip today's dose of Coumadin then resume same dose of 1 tablet every day except 1 1/2 tablets on Tuesday and Saturday.  Recheck INR in 3 weeks.

## 2010-05-31 NOTE — Medication Information (Signed)
Summary: rov/eac  Anticoagulant Therapy  Managed by: Cloyde Reams, RN, BSN Referring MD: Charlies Constable MD Supervising MD: Myrtis Ser MD, Tinnie Gens Indication 1: Deep Vein Thrombosis - Leg (ICD-451.1) Lab Used: [facility] Site: [facility] INR POC 3.1 INR RANGE 2 - 3  Dietary changes: no    Health status changes: no    Bleeding/hemorrhagic complications: no    Recent/future hospitalizations: no    Any changes in medication regimen? no    Recent/future dental: no  Any missed doses?: no       Is patient compliant with meds? yes       Allergies (verified): No Known Drug Allergies  Anticoagulation Management History:      The patient is taking warfarin and comes in today for a routine follow up visit.  Positive risk factors for bleeding include an age of 75 years or older.  The bleeding index is 'intermediate risk'.  Positive CHADS2 values include Age > 54 years old.  The start date was 08/31/1997.  His last INR was 5.5 RATIO.  Anticoagulation responsible provider: Myrtis Ser MD, Tinnie Gens.  INR POC: 3.1.  Cuvette Lot#: [id number].  Exp: 08/2009.    Anticoagulation Management Assessment/Plan:      The patient's current anticoagulation dose is Coumadin 5 mg tabs: Take as directed by coumadin clinic..  The target INR is 2.0-3.0.  The next INR is due 08/03/2009.  Anticoagulation instructions were given to patient.  Results were reviewed/authorized by Cloyde Reams, RN, BSN.  He was notified by Cloyde Reams RN.         Prior Anticoagulation Instructions: INR 2.8  Continue current dosing schedule.  Take 1.5 tablets on Monday, Wednesday, and Friday, and take 1 tablet all other days.  Return to clinic in 3 weeks.    Current Anticoagulation Instructions: INR 3.1  Start taking 1 tablet daily except 1.5 tablets on Mondays and Fridays.  Recheck in 3 weeks.

## 2010-05-31 NOTE — Medication Information (Signed)
Summary: rov/tm  Anticoagulant Therapy  Managed by: Weston Brass, PharmD Referring MD: Charlies Constable MD PCP: Dr. Carola Frost MD: Johney Frame MD, Fayrene Fearing Indication 1: Deep Vein Thrombosis - Leg (ICD-451.1) Lab Used: [facility] Site: [facility] INR POC 3.7 INR RANGE 2 - 3  Dietary changes: yes       Details: has ate more spinach in past few days  Health status changes: no    Bleeding/hemorrhagic complications: no    Recent/future hospitalizations: no    Any changes in medication regimen? no    Recent/future dental: no  Any missed doses?: no       Is patient compliant with meds? yes       Allergies: No Known Drug Allergies  Anticoagulation Management History:      Positive risk factors for bleeding include an age of 75 years or older and presence of serious comorbidities.  The bleeding index is 'intermediate risk'.  Positive CHADS2 values include Age > 75 years old.  The start date was 08/31/1997.  His last INR was 5.5 RATIO.  Anticoagulation responsible provider: Allred MD, Fayrene Fearing.  INR POC: 3.7.  Cuvette Lot#: [id number].  Exp: 12/2010.    Anticoagulation Management Assessment/Plan:      The patient's current anticoagulation dose is Coumadin 5 mg tabs: Take as directed by coumadin clinic..  The target INR is 2.0-3.0.  The next INR is due 11/17/2009.  Anticoagulation instructions were given to patient.  Results were reviewed/authorized by Weston Brass, PharmD.  He was notified by Dillard Cannon.         Prior Anticoagulation Instructions: INR 3.2 Skip today's dose then resume 5mg s everyday except 7.5mg s on Tuesdays, Thursdays and Saturdays. Recheck in 3 weeks.   Current Anticoagulation Instructions: INR 3.7  Hold Coumadin dose today.  Change to 1 tab on Thursday.  On Tues and Sat take 1.5 tabs.  Take 1 tab all other days.

## 2010-05-31 NOTE — Medication Information (Signed)
Summary: rov/eac  Anticoagulant Therapy  Managed by: Eda Keys, PharmD Referring MD: Charlies Constable MD Supervising MD: Ladona Ridgel MD, Sharlot Gowda Indication 1: Deep Vein Thrombosis - Leg (ICD-451.1) Lab Used: [facility] Site: [facility] INR POC 2.8 INR RANGE 2 - 3  Dietary changes: no    Health status changes: no    Bleeding/hemorrhagic complications: no    Recent/future hospitalizations: no    Any changes in medication regimen? no    Recent/future dental: no  Any missed doses?: no       Is patient compliant with meds? yes       Allergies: No Known Drug Allergies  Anticoagulation Management History:      The patient is taking warfarin and comes in today for a routine follow up visit.  Positive risk factors for bleeding include an age of 75 years or older.  The bleeding index is 'intermediate risk'.  Positive CHADS2 values include Age > 83 years old.  The start date was 08/31/1997.  His last INR was 5.5 RATIO.  Anticoagulation responsible provider: Ladona Ridgel MD, Sharlot Gowda.  INR POC: 2.8.  Cuvette Lot#: [id number].  Exp: 07/2010.    Anticoagulation Management Assessment/Plan:      The patient's current anticoagulation dose is Coumadin 5 mg tabs: Take as directed by coumadin clinic..  The target INR is 2.0-3.0.  The next INR is due 07/14/2009.  Anticoagulation instructions were given to patient.  Results were reviewed/authorized by Eda Keys, PharmD.  He was notified by Eda Keys.         Prior Anticoagulation Instructions: INR 1.8  Take 2 tablets today.  Then start NEW dosing schedule of 1.5 tablets on Monday, Wednesday, and Friday, and take 1 tablet all other days.  Return to clinic in 2 weeks.  Current Anticoagulation Instructions: INR 2.8  Continue current dosing schedule.  Take 1.5 tablets on Monday, Wednesday, and Friday, and take 1 tablet all other days.  Return to clinic in 3 weeks.

## 2010-05-31 NOTE — Medication Information (Signed)
Summary: rov.mp  Anticoagulant Therapy  Managed by: Shelby Dubin, PharmD, BCPS, CPP Referring MD: Charlies Constable MD Supervising MD: Juanda Chance MD, Graysen Depaula Indication 1: Deep Vein Thrombosis - Leg (ICD-451.1) Lab Used: [facility] Site: [facility] INR POC 1.5 INR RANGE 2 - 3  Dietary changes: yes       Details: more vitamin K intake  Health status changes: no    Bleeding/hemorrhagic complications: no    Recent/future hospitalizations: no    Any changes in medication regimen? no    Recent/future dental: no  Any missed doses?: no       Is patient compliant with meds? yes       Allergies (verified): No Known Drug Allergies  Anticoagulation Management History:      The patient is taking warfarin and comes in today for a routine follow up visit.  Positive risk factors for bleeding include an age of 75 years or older.  The bleeding index is 'intermediate risk'.  Positive CHADS2 values include Age > 41 years old.  The start date was 08/31/1997.  His last INR was 5.5 RATIO.  Anticoagulation responsible provider: Juanda Chance MD, Smitty Cords.  INR POC: 1.5.  Cuvette Lot#: [id number].  Exp: 07/2010.    Anticoagulation Management Assessment/Plan:      The patient's current anticoagulation dose is Coumadin 5 mg tabs: Take as directed by coumadin clinic..  The target INR is 2.0-3.0.  The next INR is due 06/09/2009.  Anticoagulation instructions were given to patient.  Results were reviewed/authorized by Shelby Dubin, PharmD, BCPS, CPP.  He was notified by Ysidro Evert, Pharm D Candidate.         Prior Anticoagulation Instructions: INR 1.8  Take 1.5 tabs each Wednesday and 1 tab on all other days.  Recheck in 2 - 3 weeks.    Current Anticoagulation Instructions: INR: 1.5 Take 1 and 1/2 tablets by mouth today and tomorrow then change dose to 1 tablet daily except 1 and 1/2 tablets on Wednesdays and Saturdays Recheck in 2 weeks

## 2010-05-31 NOTE — Medication Information (Signed)
Summary: rov/eac  Anticoagulant Therapy  Managed by: Bethena Midget, RN, BSN Referring MD: Charlies Constable MD PCP: Dr. Carola Frost MD: Excell Seltzer MD, Casimiro Needle Indication 1: Deep Vein Thrombosis - Leg (ICD-451.1) Lab Used: [facility]  Site: [facility] INR POC 3.2 INR RANGE 2 - 3  Dietary changes: no    Health status changes: no    Bleeding/hemorrhagic complications: no    Recent/future hospitalizations: no    Any changes in medication regimen? no    Recent/future dental: no  Any missed doses?: no       Is patient compliant with meds? yes       Allergies: No Known Drug Allergies  Anticoagulation Management History:      The patient is taking warfarin and comes in today for a routine follow up visit.  Positive risk factors for bleeding include an age of 75 years or older and presence of serious comorbidities.  The bleeding index is 'intermediate risk'.  Positive CHADS2 values include Age > 82 years old.  The start date was 08/31/1997.  His last INR was 5.5 RATIO.  Anticoagulation responsible provider: Excell Seltzer MD, Casimiro Needle.  INR POC: 3.2.  Cuvette Lot#: [id number].  Exp: 11/2009.    Anticoagulation Management Assessment/Plan:      The patient's current anticoagulation dose is Coumadin 5 mg tabs: Take as directed by coumadin clinic..  The target INR is 2.0-3.0.  The next INR is due 11/03/2009.  Anticoagulation instructions were given to patient.  Results were reviewed/authorized by Bethena Midget, RN, BSN.  He was notified by Bethena Midget, RN, BSN.         Prior Anticoagulation Instructions: INR 2.9  Continue taking 1.5 tablets on Tuesday, Thursday, and Saturday and 1 tablet all other days.  Return to clinic in 4 weeks.    Current Anticoagulation Instructions: INR 3.2 Skip today's dose then resume 5mg s everyday except 7.5mg s on Tuesdays, Thursdays and Saturdays. Recheck in 3 weeks.

## 2010-05-31 NOTE — Medication Information (Signed)
Summary: rov/cb  Anticoagulant Therapy  Managed by: Eda Keys, PharmD Referring MD: Charlies Constable MD PCP: Dr. Carola Frost MD: Graciela Husbands MD, Viviann Spare Indication 1: Deep Vein Thrombosis - Leg (ICD-451.1) Lab Used: [facility] Site: [facility] INR POC 2.9 INR RANGE 2 - 3  Dietary changes: no    Health status changes: no    Bleeding/hemorrhagic complications: no    Recent/future hospitalizations: no    Any changes in medication regimen? no    Recent/future dental: no  Any missed doses?: no       Is patient compliant with meds? yes       Allergies: No Known Drug Allergies  Anticoagulation Management History:      The patient is taking warfarin and comes in today for a routine follow up visit.  Positive risk factors for bleeding include an age of 75 years or older and presence of serious comorbidities.  The bleeding index is 'intermediate risk'.  Positive CHADS2 values include Age > 75 years old.  The start date was 08/31/1997.  His last INR was 5.5 RATIO.  Anticoagulation responsible provider: Graciela Husbands MD, Viviann Spare.  INR POC: 2.9.  Cuvette Lot#: [id number].  Exp: 11/2009.    Anticoagulation Management Assessment/Plan:      The patient's current anticoagulation dose is Coumadin 5 mg tabs: Take as directed by coumadin clinic..  The target INR is 2.0-3.0.  The next INR is due 10/12/2009.  Anticoagulation instructions were given to patient.  Results were reviewed/authorized by Eda Keys, PharmD.  He was notified by Eda Keys.         Prior Anticoagulation Instructions: INR 2.2. Take 1 tablet daily except 1.5 tablets on Tues, Thurs, Sat.  Current Anticoagulation Instructions: INR 2.9  Continue taking 1.5 tablets on Tuesday, Thursday, and Saturday and 1 tablet all other days.  Return to clinic in 4 weeks.

## 2010-05-31 NOTE — Medication Information (Signed)
Summary: rov/ewj  Anticoagulant Therapy  Managed by: Shelby Dubin, PharmD, BCPS, CPP Referring MD: Charlies Constable MD Supervising MD: Clifton James MD, Cristal Deer Indication 1: Deep Vein Thrombosis - Leg (ICD-451.1) Lab Used: [facility] Site: [facility] INR POC 1.8 INR RANGE 2 - 3  Dietary changes: no    Health status changes: no    Bleeding/hemorrhagic complications: no    Recent/future hospitalizations: no    Any changes in medication regimen? no    Recent/future dental: no  Any missed doses?: no       Is patient compliant with meds? yes       Current Medications (verified): 1)  Coumadin 5 Mg Tabs (Warfarin Sodium) .... Take As Directed By Coumadin Clinic. 2)  Sulfamethoxazole-Tmp Ds 800-160 Mg Tabs (Sulfamethoxazole-Trimethoprim) .Marland Kitchen.. 1 By Mouth Bid 3)  Furosemide 20 Mg Tabs (Furosemide) .... Take 1 Tablet By Mouth Daily.  Allergies (verified): No Known Drug Allergies  Anticoagulation Management History:      The patient is taking warfarin and comes in today for a routine follow up visit.  Positive risk factors for bleeding include an age of 75 years or older.  The bleeding index is 'intermediate risk'.  Positive CHADS2 values include Age > 71 years old.  The start date was 08/31/1997.  His last INR was 5.5 RATIO.  Anticoagulation responsible provider: Clifton James MD, Cristal Deer.  INR POC: 1.8.  Cuvette Lot#: [id number].  Exp: 06/2010.    Anticoagulation Management Assessment/Plan:      The patient's current anticoagulation dose is Coumadin 5 mg tabs: Take as directed by coumadin clinic..  The target INR is 2.0-3.0.  The next INR is due 05/26/2009.  Anticoagulation instructions were given to patient.  Results were reviewed/authorized by Shelby Dubin, PharmD, BCPS, CPP.  He was notified by Shelby Dubin PharmD, BCPS, CPP.         Prior Anticoagulation Instructions: INR 1.7  Start taking 1 tablet (5mg ) daily.  Recheck in 3 weeks.    Current Anticoagulation Instructions: INR  1.8  Take 1.5 tabs each Wednesday and 1 tab on all other days.  Recheck in 2 - 3 weeks.

## 2010-05-31 NOTE — Medication Information (Signed)
Summary: rov/sp      Allergies Added: NKDA Anticoagulant Therapy  Managed by: Reina Fuse, PharmD Referring MD: Charlies Constable MD PCP: Dr. Carola Frost MD: Jens Som MD, Arlys [name] Indication 1: Deep Vein Thrombosis - Leg (ICD-451.1) Lab Used: [facility] Site: [facility] INR POC 3.1 INR RANGE 2 - 3  Dietary changes: no    Health status changes: no    Bleeding/hemorrhagic complications: no    Recent/future hospitalizations: no    Any changes in medication regimen? no    Recent/future dental: no  Any missed doses?: no       Is patient compliant with meds? yes       Current Medications (verified): 1)  Coumadin 5 Mg Tabs (Warfarin Sodium) .... Take As Directed By Coumadin Clinic. 2)  Sulfamethoxazole-Tmp Ds 800-160 Mg Tabs (Sulfamethoxazole-Trimethoprim) .Marland Kitchen.. 1 By Mouth Bid 3)  Furosemide 20 Mg Tabs (Furosemide) .... Take 1 Tablet By Mouth Daily. 4)  Clobetasol Propionate 0.05 % Oint (Clobetasol Propionate) .... Apply Emollient Prn 5)  Triamcinolone Acetonide 0.1 % Crea (Triamcinolone Acetonide) .... Apply Daily On Chest & Back  Allergies (verified): No Known Drug Allergies  Anticoagulation Management History:      The patient is taking warfarin and comes in today for a routine follow up visit.  Positive risk factors for bleeding include an age of 2 years or older and presence of serious comorbidities.  The bleeding index is 'intermediate risk'.  Positive CHADS2 values include Age > 21 years old.  The start date was 08/31/1997.  His last INR was 5.5 RATIO.  Anticoagulation responsible provider: Jens Som MD, Arlys [name].  INR POC: 3.1.  Cuvette Lot#: [id number].  Exp: 12/2010.    Anticoagulation Management Assessment/Plan:      The patient's current anticoagulation dose is Coumadin 5 mg tabs: Take as directed by coumadin clinic..  The target INR is 2.0-3.0.  The next INR is due 12/31/2009.  Anticoagulation instructions were given to patient.  Results were reviewed/authorized by Reina Fuse, PharmD.  He was notified by Reina Fuse PharmD.         Prior Anticoagulation Instructions: INR 2.6  Continue same dose of 1 tablet every day except 1 1/2 tablets on Tuesday and Saturday.   Current Anticoagulation Instructions: INR 3.1  Take Coumadin 0.5 tab (2.5 mg) today, Wednesday, August 10th.  Then, continue taking Coumadin 1 tab (5 mg) on Sun, Mon, Wed, Thur, Fri and Coumadin 1.5 tabs (7.5 mg) On Tues and Sat.  Return to clinic in 3 weeks.

## 2010-05-31 NOTE — Medication Information (Signed)
Summary: rov/cs   Anticoagulant Therapy  Managed by: Reina Fuse, PharmD Referring MD: Charlies Constable MD PCP: Dr. Carola Frost MD: Eden Emms MD, Theron Arista Indication 1: Deep Vein Thrombosis - Leg (ICD-451.1) Lab Used: [facility] Site: [facility] INR POC 2.5 INR RANGE 2 - 3  Dietary changes: no    Health status changes: no    Bleeding/hemorrhagic complications: no    Recent/future hospitalizations: no    Any changes in medication regimen? yes       Details: Starting Cipro today x 14.   Recent/future dental: no  Any missed doses?: no       Is patient compliant with meds? yes       Allergies: No Known Drug Allergies  Anticoagulation Management History:      The patient is taking warfarin and comes in today for a routine follow up visit.  Positive risk factors for bleeding include an age of 75 years or older and presence of serious comorbidities.  The bleeding index is 'intermediate risk'.  Positive CHADS2 values include Age > 104 years old.  The start date was 08/31/1997.  His last INR was 3.5.  Anticoagulation responsible provider: Eden Emms MD, Theron Arista.  INR POC: 2.5.  Cuvette Lot#: [id number].  Exp: 03/2011.    Anticoagulation Management Assessment/Plan:      The patient's current anticoagulation dose is Coumadin 5 mg tabs: Take as directed by coumadin clinic..  The target INR is 2.0-3.0.  The next INR is due 03/30/2010.  Anticoagulation instructions were given to patient.  Results were reviewed/authorized by Reina Fuse, PharmD.  He was notified by Reina Fuse PharmD.         Prior Anticoagulation Instructions: INR 3.5  Skip today's dose. Then take Coumadin 1 tablet every day of the week, except 1 and 1/2 tablets on Saturday.  Return to clinic in 3 weeks.    Current Anticoagulation Instructions: INR 2.5  This week take Coumadin 1 tab (5 mg) every day due to starting Cipro. Then continue same schedule of Coumadin 1 tab (5 mg) every day except for Coumadin 1.5 tabs (7.5 mg) on  Saturdays. Return to clinic in 3 weeks.  Prescriptions: COUMADIN 5 MG TABS (WARFARIN SODIUM) Take as directed by coumadin clinic.  #40 x 3   Entered by:   Reina Fuse PharmD   Authorized by:   Colon Branch, MD, [facility]   Signed by:   Reina Fuse PharmD on 03/09/2010   Method used:   Faxed to ...       [facility] (retail)       [address] [place]       [address]       [place], [place]  [address]       Ph: [phone]       Fax: [phone]   RxID:   [id number]

## 2010-05-31 NOTE — Medication Information (Signed)
Summary: rov/sl   Anticoagulant Therapy  Managed by: Reina Fuse, PharmD Referring MD: Charlies Constable MD PCP: Dr. Carola Frost MD: Gala Romney MD, Reuel Boom Indication 1: Deep Vein Thrombosis - Leg (ICD-451.1) Lab Used: [facility] Site: [facility] INR POC 3.1 INR RANGE 2 - 3  Dietary changes: no    Health status changes: no    Bleeding/hemorrhagic complications: no    Recent/future hospitalizations: no    Any changes in medication regimen? no    Recent/future dental: no  Any missed doses?: no       Is patient compliant with meds? yes       Allergies: No Known Drug Allergies  Anticoagulation Management History:      The patient is taking warfarin and comes in today for a routine follow up visit.  Positive risk factors for bleeding include an age of 75 years or older and presence of serious comorbidities.  The bleeding index is 'intermediate risk'.  Positive CHADS2 values include Age > 2 years old.  The start date was 08/31/1997.  His last INR was 3.5.  Anticoagulation responsible provider: Bensimhon MD, Reuel Boom.  INR POC: 3.1.  Cuvette Lot#: [id number].  Exp: 03/2011.    Anticoagulation Management Assessment/Plan:      The patient's current anticoagulation dose is Coumadin 5 mg tabs: Take as directed by coumadin clinic..  The target INR is 2.0-3.0.  The next INR is due 04/27/2010.  Anticoagulation instructions were given to patient.  Results were reviewed/authorized by Reina Fuse, PharmD.  He was notified by Reina Fuse PharmD.         Prior Anticoagulation Instructions: INR 2.5  This week take Coumadin 1 tab (5 mg) every day due to starting Cipro. Then continue same schedule of Coumadin 1 tab (5 mg) every day except for Coumadin 1.5 tabs (7.5 mg) on Saturdays. Return to clinic in 3 weeks.   Current Anticoagulation Instructions: INR 3.1  Today, Wednesday, November 29th, take Coumadin 0.5 tab (2.5 mg) Then, resume taking Coumadin 1 tab (5 mg) on all days except for  Coumadin 1.5 tabs (7.5 mg) on Saturdays. Return to clinic in 4 weeks.

## 2010-05-31 NOTE — Letter (Signed)
Summary: Handout Printed  Printed Handout:  - Coumadin Instructions-w/out Meds 

## 2010-05-31 NOTE — Medication Information (Signed)
Summary: rov/sl  Anticoagulant Therapy  Managed by: Weston Brass, PharmD Referring MD: Charlies Constable MD PCP: Dr. Carola Frost MD: Tenny Craw MD, Gunnar Fusi Indication 1: Deep Vein Thrombosis - Leg (ICD-451.1) Lab Used: [facility] Site: [facility] INR POC 2.6 INR RANGE 2 - 3  Dietary changes: no    Health status changes: no    Bleeding/hemorrhagic complications: no    Recent/future hospitalizations: no    Any changes in medication regimen? no    Recent/future dental: no  Any missed doses?: no       Is patient compliant with meds? yes       Allergies: No Known Drug Allergies  Anticoagulation Management History:      The patient is taking warfarin and comes in today for a routine follow up visit.  Positive risk factors for bleeding include an age of 22 years or older and presence of serious comorbidities.  The bleeding index is 'intermediate risk'.  Positive CHADS2 values include Age > 19 years old.  The start date was 08/31/1997.  His last INR was 5.5 RATIO.  Anticoagulation responsible provider: Tenny Craw MD, Gunnar Fusi.  INR POC: 2.6.  Cuvette Lot#: [id number].  Exp: 01/2011.    Anticoagulation Management Assessment/Plan:      The patient's current anticoagulation dose is Coumadin 5 mg tabs: Take as directed by coumadin clinic..  The target INR is 2.0-3.0.  The next INR is due 01/28/2010.  Anticoagulation instructions were given to patient.  Results were reviewed/authorized by Weston Brass, PharmD.  He was notified by Harrel Carina, PharmD candidate.         Prior Anticoagulation Instructions: INR 3.1  Take Coumadin 0.5 tab (2.5 mg) today, Wednesday, August 10th.  Then, continue taking Coumadin 1 tab (5 mg) on Sun, Mon, Wed, Thur, Fri and Coumadin 1.5 tabs (7.5 mg) On Tues and Sat.  Return to clinic in 3 weeks.   Current Anticoagulation Instructions: INR 2.6  Continue taking regular schedule of 1 tablet every day except take 1 1/2 tablets on Tuesdays and Saturdays. Re-check INR in 4  weeks.

## 2010-05-31 NOTE — Medication Information (Signed)
Summary: rov/tm  Anticoagulant Therapy  Managed by: Elaina Pattee, PharmD Referring MD: Charlies Constable MD PCP: Dr. Carola Frost MD: Excell Seltzer MD, Casimiro Needle Indication 1: Deep Vein Thrombosis - Leg (ICD-451.1) Lab Used: [facility] Site: [facility] INR POC 2.2 INR RANGE 2 - 3  Dietary changes: no    Health status changes: no    Bleeding/hemorrhagic complications: no    Recent/future hospitalizations: no    Any changes in medication regimen? no    Recent/future dental: no  Any missed doses?: no       Is patient compliant with meds? yes       Allergies: No Known Drug Allergies  Anticoagulation Management History:      The patient is taking warfarin and comes in today for a routine follow up visit.  Positive risk factors for bleeding include an age of 75 years or older and presence of serious comorbidities.  The bleeding index is 'intermediate risk'.  Positive CHADS2 values include Age > 74 years old.  The start date was 08/31/1997.  His last INR was 5.5 RATIO.  Anticoagulation responsible provider: Excell Seltzer MD, Casimiro Needle.  INR POC: 2.2.  Cuvette Lot#: [id number].  Exp: 08/2009.    Anticoagulation Management Assessment/Plan:      The patient's current anticoagulation dose is Coumadin 5 mg tabs: Take as directed by coumadin clinic..  The target INR is 2.0-3.0.  The next INR is due 09/14/2009.  Anticoagulation instructions were given to patient.  Results were reviewed/authorized by Elaina Pattee, PharmD.  He was notified by Elaina Pattee, PharmD.         Prior Anticoagulation Instructions: INR 1.7 Today take 7.5mg s then change dose to 5mg s daily except 7.5mg s on Tuesdays, Thursdays and Saturdays. Recheck in 2 weeks.   Current Anticoagulation Instructions: INR 2.2. Take 1 tablet daily except 1.5 tablets on Tues, Thurs, Sat.

## 2010-06-02 NOTE — Medication Information (Signed)
Summary: rov/sl   Anticoagulant Therapy  Managed by: Weston Brass, PharmD Referring MD: Charlies Constable MD PCP: Dr. Carola Frost MD: [name] MD, Maisie Fus Indication 1: Deep Vein Thrombosis - Leg (ICD-451.1) Lab Used: [facility] Site: [facility] INR POC 2.8 INR RANGE 2 - 3  Dietary changes: no    Health status changes: no    Bleeding/hemorrhagic complications: no    Recent/future hospitalizations: no    Any changes in medication regimen? yes       Details: off Cipro  Recent/future dental: no  Any missed doses?: no       Is patient compliant with meds? yes       Allergies: No Known Drug Allergies  Anticoagulation Management History:      The patient is taking warfarin and comes in today for a routine follow up visit.  Positive risk factors for bleeding include an age of 75 years or older and presence of serious comorbidities.  The bleeding index is 'intermediate risk'.  Positive CHADS2 values include Age > 23 years old.  The start date was 08/31/1997.  His last INR was 3.5.  Anticoagulation responsible provider: [name] MD, Maisie Fus.  INR POC: 2.8.  Cuvette Lot#: [id number].  Exp: 05/2011.    Anticoagulation Management Assessment/Plan:      The patient's current anticoagulation dose is Coumadin 5 mg tabs: Take as directed by coumadin clinic..  The target INR is 2.0-3.0.  The next INR is due 05/25/2010.  Anticoagulation instructions were given to patient.  Results were reviewed/authorized by Weston Brass, PharmD.  He was notified by Weston Brass PharmD.         Prior Anticoagulation Instructions: INR 3.1  Today, Wednesday, November 29th, take Coumadin 0.5 tab (2.5 mg) Then, resume taking Coumadin 1 tab (5 mg) on all days except for Coumadin 1.5 tabs (7.5 mg) on Saturdays. Return to clinic in 4 weeks.   Current Anticoagulation Instructions: INR 2.8  Continue same dose of 1 tablet everyday except 1 1/2 tablets on Saturday.  Recheck INR in 4 weeks.

## 2010-06-02 NOTE — Medication Information (Signed)
Summary: rov/sp   Anticoagulant Therapy  Managed by: Louann Sjogren, PharmD Referring MD: Charlies Constable MD PCP: Dr. Carola Frost MD: Eden Emms MD,Thi Klich Indication 1: Deep Vein Thrombosis - Leg (ICD-451.1) Lab Used: [facility] Site: [facility] INR POC 2.6 INR RANGE 2 - 3  Dietary changes: no    Health status changes: no    Bleeding/hemorrhagic complications: no    Recent/future hospitalizations: no    Any changes in medication regimen? no    Recent/future dental: no  Any missed doses?: no       Is patient compliant with meds? yes       Allergies: No Known Drug Allergies  Anticoagulation Management History:      The patient is taking warfarin and comes in today for a routine follow up visit.  Positive risk factors for bleeding include an age of 75 years or older and presence of serious comorbidities.  The bleeding index is 'intermediate risk'.  Positive CHADS2 values include Age > 75 years old.  The start date was 08/31/1997.  His last INR was 3.5 and today's INR is 2.6.  Anticoagulation responsible provider: Eden Emms MD,Plummer Matich.  INR POC: 2.6.  Cuvette Lot#: [id number].  Exp: 05/2011.    Anticoagulation Management Assessment/Plan:      The patient's current anticoagulation dose is Coumadin 5 mg tabs: Take as directed by coumadin clinic..  The target INR is 2.0-3.0.  The next INR is due 06/22/2010.  Anticoagulation instructions were given to patient.  Results were reviewed/authorized by Louann Sjogren, PharmD.         Prior Anticoagulation Instructions: INR 2.8  Continue same dose of 1 tablet everyday except 1 1/2 tablets on Saturday.  Recheck INR in 4 weeks.   Current Anticoagulation Instructions: INR 2.6 (goal 2-3)  Continue taking 1 tablet everyday except take 1 1/2 tablets on Saturdays. Recheck INR in 4 weeks.

## 2010-06-14 DIAGNOSIS — I82409 Acute embolism and thrombosis of unspecified deep veins of unspecified lower extremity: Secondary | ICD-10-CM

## 2010-06-22 ENCOUNTER — Encounter: Payer: Self-pay | Admitting: Cardiology

## 2010-06-22 ENCOUNTER — Encounter (INDEPENDENT_AMBULATORY_CARE_PROVIDER_SITE_OTHER): Payer: Medicare Other

## 2010-06-22 DIAGNOSIS — Z7901 Long term (current) use of anticoagulants: Secondary | ICD-10-CM

## 2010-06-22 DIAGNOSIS — I80299 Phlebitis and thrombophlebitis of other deep vessels of unspecified lower extremity: Secondary | ICD-10-CM

## 2010-06-28 NOTE — Medication Information (Signed)
Summary: rov/sp   Anticoagulant Therapy  Managed by: Weston Brass, PharmD Referring MD: Charlies Constable MD PCP: Dr. Carola Frost MD: Shirlee Latch MD, Freida Busman Indication 1: Deep Vein Thrombosis - Leg (ICD-451.1) Lab Used: [facility] Site: [facility] INR POC 2.8 INR RANGE 2 - 3  Dietary changes: no    Health status changes: no    Bleeding/hemorrhagic complications: no    Recent/future hospitalizations: no    Any changes in medication regimen? no    Recent/future dental: no  Any missed doses?: no       Is patient compliant with meds? yes       Allergies: No Known Drug Allergies  Anticoagulation Management History:      The patient is taking warfarin and comes in today for a routine follow up visit.  Positive risk factors for bleeding include an age of 75 years or older and presence of serious comorbidities.  The bleeding index is 'intermediate risk'.  Positive CHADS2 values include Age > 34 years old.  The start date was 08/31/1997.  His last INR was 2.6.  Anticoagulation responsible provider: Shirlee Latch MD, Ozzie Knobel.  INR POC: 2.8.  Cuvette Lot#: [id number].  Exp: 05/2011.    Anticoagulation Management Assessment/Plan:      The patient's current anticoagulation dose is Coumadin 5 mg tabs: Take as directed by coumadin clinic..  The target INR is 2.0-3.0.  The next INR is due 07/20/2010.  Anticoagulation instructions were given to patient.  Results were reviewed/authorized by Weston Brass, PharmD.  He was notified by Weston Brass PharmD.         Prior Anticoagulation Instructions: INR 2.6 (goal 2-3)  Continue taking 1 tablet everyday except take 1 1/2 tablets on Saturdays. Recheck INR in 4 weeks.  Current Anticoagulation Instructions: INR 2.8  Continue same dose of 1 tablet every day except 1 1/2 tablets on Saturday.  Recheck INR in 4 weeks.

## 2010-07-20 ENCOUNTER — Ambulatory Visit (INDEPENDENT_AMBULATORY_CARE_PROVIDER_SITE_OTHER): Payer: Medicare Other | Admitting: *Deleted

## 2010-07-20 ENCOUNTER — Other Ambulatory Visit: Payer: Self-pay

## 2010-07-20 DIAGNOSIS — I82409 Acute embolism and thrombosis of unspecified deep veins of unspecified lower extremity: Secondary | ICD-10-CM

## 2010-07-20 MED ORDER — WARFARIN SODIUM 5 MG PO TABS: ORAL_TABLET | ORAL | Status: DC

## 2010-07-20 NOTE — Patient Instructions (Signed)
INR 2.3 Continue taking 1 tablet (5mg ) daily, except take 1 1/2 tablets (7.5mg ). Recheck in 4 weeks.

## 2010-07-20 NOTE — Telephone Encounter (Signed)
Called pharmacy ([facility]) and gave a verbal refill.

## 2010-07-26 ENCOUNTER — Encounter: Payer: Self-pay | Admitting: Cardiology

## 2010-07-28 ENCOUNTER — Ambulatory Visit: Payer: Self-pay | Admitting: Cardiology

## 2010-08-09 ENCOUNTER — Encounter: Payer: Self-pay | Admitting: Cardiology

## 2010-08-09 ENCOUNTER — Ambulatory Visit (INDEPENDENT_AMBULATORY_CARE_PROVIDER_SITE_OTHER): Payer: Medicare Other | Admitting: Cardiology

## 2010-08-09 ENCOUNTER — Other Ambulatory Visit: Payer: Self-pay | Admitting: *Deleted

## 2010-08-09 DIAGNOSIS — I82409 Acute embolism and thrombosis of unspecified deep veins of unspecified lower extremity: Secondary | ICD-10-CM

## 2010-08-09 DIAGNOSIS — I421 Obstructive hypertrophic cardiomyopathy: Secondary | ICD-10-CM

## 2010-08-09 DIAGNOSIS — I422 Other hypertrophic cardiomyopathy: Secondary | ICD-10-CM

## 2010-08-09 DIAGNOSIS — I872 Venous insufficiency (chronic) (peripheral): Secondary | ICD-10-CM

## 2010-08-09 DIAGNOSIS — I428 Other cardiomyopathies: Secondary | ICD-10-CM

## 2010-08-09 MED ORDER — WARFARIN SODIUM 5 MG PO TABS: ORAL_TABLET | ORAL | Status: DC

## 2010-08-09 NOTE — Patient Instructions (Addendum)
Your physician recommends that you continue on your current medications as directed. Please refer to the Current Medication list given to you today.  Your physician wants you to follow-up in: 1 YEAR.  You will receive a reminder letter in the mail two months in advance. If you don't receive a letter, please call our office to schedule the follow-up appointment.  Your physician recommends that you return for lab work at your next coumadin visit: CBC, BMP, LIVER, TSH (425.4)

## 2010-08-09 NOTE — Progress Notes (Signed)
HPI:  Roy Blackwell comes in for follow up.  He has been followed by [facility] for some time, mainly for warfarin anticoagulation for history of DVT.  He still smokes, and remains on long term warfarin.  He has no new symptoms.  He does not have a primary.  He sees [facility] about once per year.  Of note, he also has HOCM which has been followed with an abnormal ECG over many years with anterolateral T wave inversion.  Roy Blackwell will be 82 in the summer.  He denies most symptoms.  He still sees Dr. Zachery Dakins for his feet.    Current Outpatient Prescriptions  Medication Sig Dispense Refill  . furosemide (LASIX) 20 MG tablet Take 20 mg by mouth daily.        Marland Kitchen sulfamethoxazole-trimethoprim (BACTRIM DS,SEPTRA DS) 800-160 MG per tablet Take 1 tablet by mouth 2 (two) times daily.        Marland Kitchen DISCONTD: warfarin (COUMADIN) 5 MG tablet Take as directed by the anticoagulation clinic.  40 tablet  3  . clobetasol (TEMOVATE) 0.05 % cream as needed.        . triamcinolone (KENALOG) 0.1 % cream as directed.        . warfarin (COUMADIN) 5 MG tablet Take as directed by the anticoagulation clinic.  60 tablet  3    No Known Allergies  Past Medical History  Diagnosis Date  . Venous insufficiency   . Hypertrophic cardiomyopathy     No past surgical history on file.  No family history on file.  History   Social History  . Marital Status: Married    Spouse Name: N/A    Number of Children: N/A  . Years of Education: N/A   Occupational History  . retired from [employer]    Social History Main Topics  . Smoking status: Current Everyday Smoker  . Smokeless tobacco: Not on file  . Alcohol Use: No  . Drug Use: No  . Sexually Active: Not on file   Other Topics Concern  . Not on file   Social History Narrative  . No narrative on file    ROS: Please see the HPI.  All other systems reviewed and negative.  PHYSICAL EXAM:  BP 146/64  Pulse 56  Ht 5\' 8"  (1.727 m)  Wt 162 lb (73.483 kg)  BMI 24.63 kg/m2  General:  Well developed, well nourished, in no acute distress. Head:  Normocephalic and atraumatic. Neck: no JVD Lungs: Clear to auscultation and percussion. Heart: Normal S1 and S2.  Prominent S4 gallop. 1-2/6  SEM without diastolic murmur. Abdomen:  Normal bowel sounds; soft; non tender; no organomegaly Pulses: Pulses normal upper.  Not testee below. Extremities: Wrapped with evidence of venous insufficiency. Neurologic: Alert and oriented x 3.  EKG:  SB.  LVH with marked anterolateral T inversion.  Compared to prior ECG, no change.    ASSESSMENT AND PLAN:

## 2010-08-15 DIAGNOSIS — I422 Other hypertrophic cardiomyopathy: Secondary | ICD-10-CM | POA: Insufficient documentation

## 2010-08-15 NOTE — Assessment & Plan Note (Signed)
Remains on life long anticoagulation and followed in the coumadin clinic.  Continue as at present.

## 2010-08-15 NOTE — Assessment & Plan Note (Signed)
See note regarding HOCM.

## 2010-08-15 NOTE — Assessment & Plan Note (Signed)
Last echo 2008 shows LVH but no outflow obstruction.  ECG is unchanged.  He remains with symptoms.  Of note, BP is reasonably controlled.

## 2010-08-15 NOTE — Assessment & Plan Note (Signed)
Followed by Dr. Zachery Dakins and wrapped.   He will continue to follow.

## 2010-08-17 ENCOUNTER — Ambulatory Visit (INDEPENDENT_AMBULATORY_CARE_PROVIDER_SITE_OTHER): Payer: Medicare Other | Admitting: *Deleted

## 2010-08-17 ENCOUNTER — Other Ambulatory Visit (INDEPENDENT_AMBULATORY_CARE_PROVIDER_SITE_OTHER): Payer: Medicare Other | Admitting: *Deleted

## 2010-08-17 DIAGNOSIS — Z79899 Other long term (current) drug therapy: Secondary | ICD-10-CM

## 2010-08-17 DIAGNOSIS — I82409 Acute embolism and thrombosis of unspecified deep veins of unspecified lower extremity: Secondary | ICD-10-CM

## 2010-08-17 DIAGNOSIS — I428 Other cardiomyopathies: Secondary | ICD-10-CM

## 2010-08-17 LAB — CBC WITH DIFFERENTIAL/PLATELET
Basophils Relative: 0.4 % (ref 0.0–3.0)
Eosinophils Relative: 2.3 % (ref 0.0–5.0)
HCT: 33.7 % — ABNORMAL LOW (ref 39.0–52.0)
Lymphs Abs: 1.8 10*3/uL (ref 0.7–4.0)
MCV: 98.1 fl (ref 78.0–100.0)
Monocytes Absolute: 1.1 10*3/uL — ABNORMAL HIGH (ref 0.1–1.0)
Platelets: 428 10*3/uL — ABNORMAL HIGH (ref 150.0–400.0)
WBC: 8.6 10*3/uL (ref 4.5–10.5)

## 2010-08-17 LAB — BASIC METABOLIC PANEL
BUN: 17 mg/dL (ref 6–23)
Chloride: 105 mEq/L (ref 96–112)
Glucose, Bld: 80 mg/dL (ref 70–99)
Potassium: 4.8 mEq/L (ref 3.5–5.1)

## 2010-08-17 LAB — HEPATIC FUNCTION PANEL
Bilirubin, Direct: 0 mg/dL (ref 0.0–0.3)
Total Bilirubin: 0.2 mg/dL — ABNORMAL LOW (ref 0.3–1.2)
Total Protein: 7.3 g/dL (ref 6.0–8.3)

## 2010-08-17 LAB — POCT INR: INR: 3.9

## 2010-08-17 LAB — TSH: TSH: 2.8 u[IU]/mL (ref 0.35–5.50)

## 2010-08-30 ENCOUNTER — Ambulatory Visit (INDEPENDENT_AMBULATORY_CARE_PROVIDER_SITE_OTHER): Payer: Medicare Other | Admitting: *Deleted

## 2010-08-30 DIAGNOSIS — I82409 Acute embolism and thrombosis of unspecified deep veins of unspecified lower extremity: Secondary | ICD-10-CM

## 2010-09-13 NOTE — Assessment & Plan Note (Signed)
[facility]                            CARDIOLOGY OFFICE NOTE   NAME:Roy Blackwell, Roy Blackwell                       MRN:          [id number]  DATE:07/12/2007                            DOB:          [birth year]    PRIMARY CARE PHYSICIAN:  Dr. Illene Regulus   SURGEON:  Dr. Thornton Dales   DERMATOLOGIST:  Dr. Dorinda Hill   CLINICAL HISTORY:  Roy Blackwell came in for an add-on today because of  increased swelling in his right lower extremity.  He says this has  happened over the past 3 days.  He has chronic venous insufficiency in  the lower extremities which is being managed by Dr. Zachery Dakins with Ace  wraps every morning.  He has had previous ulcers which have healed  fairly well.  He says over the last few days when he gets up the morning  the right leg has had some edema and has not come down as well as the  left leg and has been more swollen than the left leg.  He has had no  warmth or erythema that he can detect and he has had no fever.  He has  also had no major pain in the leg.   His past medical history is significant for a mild hypertrophic  cardiomyopathy by 2-D echocardiogram.  He had a history of vasovagal  syncope.  He also has a previous history of prostate cancer.  He had a  pulmonary nodule by x-ray but his last CT in May 2008 did not show any  nodule.   His current medications include only Coumadin.   EXAMINATION TODAY:  The blood pressure was 124/86 and the pulse 77 and  regular.  There was no venous distension.  The carotid pulses were full  without bruits.  CHEST:  Clear.  CARDIAC:  Rhythm was regular.  Short systolic murmur left sternal edge.  ABDOMEN:  Soft with normal bowel sounds.  There is no  hepatosplenomegaly.  There was 2+ edema of the right lower extremity.  Left lower extremity  was wrapped but appeared to have minimal edema.  I had difficulty  feeling pulse in the lower extremity.  There were healed ulcerations in  lower extremities.  There were marked stasis changes but there not did  appear to be any erythema or warmth.   IMPRESSION:  1. Increasing edema in the right lower extremity related to venous      insufficiency, rule out deep-vein thrombophlebitis.  2. Chronic venous insufficiency and stasis ulcers of both lower      extremities.  3. Mild hypertrophic cardiomyopathy.   RECOMMENDATIONS:  I do not detect any evidence of cellulitis in the leg.  I am not certain why the edema has increased.  I will plan to get venous  Dopplers to rule out deep vein thrombophlebitis although I think this is  unlikely.  Will give him a short course of diuretics and use  hydrochlorothiazide.  This potentially could aggravate his chronic  eczema which he is being seen for by Dr. Mayford Knife, but hopefully this will  be  just a short-term treatment.  I told him to keep his leg up as much  as he can today and beginning tomorrow to continue his wraps and  dressings like he normally does.  I asked him to call and make an  appointment to see Dr. Zachery Dakins back next week.  He was previously  scheduled to see him near the end of the month.  I will see him back as  needed.     Bruce Elvera Lennox Juanda Chance, MD, [facility]  Electronically Signed    BRB/MedQ  DD: 07/12/2007  DT: 07/13/2007  Job #: [id number]

## 2010-09-13 NOTE — Discharge Summary (Signed)
NAME:  Roy Blackwell, Roy Blackwell                ACCOUNT NO.:  [id number]   MEDICAL RECORD NO.:  [id number]          PATIENT TYPE:  INP   LOCATION:  3743                         FACILITY:  [facility]   PHYSICIAN:  Bruce R. Juanda Chance, MD, FACCDATE OF BIRTH:  1928/05/10   DATE OF ADMISSION:  09/16/2006  DATE OF DISCHARGE:  09/18/2006                               DISCHARGE SUMMARY   PRIMARY CARDIOLOGIST:  Dr. Juanda Chance.   PRIMARY CARE PHYSICIAN:  Dr. Illene Regulus.   PRINCIPAL DIAGNOSIS:  Presyncope.   SECONDARY DIAGNOSIS:  1. Abnormal ECG.  2. History of thrombophlebitis and deep venous thrombosis on chronic      Coumadin therapy.  3. History of hypertension.  4. History of prostate cancer.  5. History of pulmonary nodule by chest x-ray.   ALLERGIES:  NO KNOWN DRUG ALLERGIES.   PROCEDURES:  1. A 2D echocardiogram.  2. CT of the chest.   HISTORY OF PRESENT ILLNESS:  [age]-year-old male with the above  problem list who was in his usual state of health until Sep 16, 2006  when while at church he was standing, reading a prayer, he had sudden  onset of lightheadedness, causing him to lean to the right without loss  of consciousness.  His wife help ease him into the pew and he was  subsequently placed into a wheelchair and brought to the [facility] ED.  In the ED he was in sinus rhythm with diffuse T wave inversions and deep  T wave inversion in the anterolateral leads.  Cardiac markers were  negative and his INR was subtherapeutic.  He was admitted for further  evaluation.   HOSPITAL COURSE:  A CT of the head was performed and this showed no  acute intracranial findings.  CT of the chest was also performed, which  showed no evidence of pulmonary embolism or other active disease.  A 2D  echocardiogram was performed on May 19 and this revealed normal LV  function with an EF of 55-60% without regional wall motion  abnormalities.  There was trivial aortic insufficiency.  As his cardia  markers  remained negative and his EF was normal, a decision was made to  discharge the patient today with followup outpatient Myoview on May 30  at 7:15 a.m.   DISCHARGE LABS:  Hemoglobin 12.8, hematocrit 37.4, WBC 9.6, platelets  335.  PT 17.3, INR 1.4.  Sodium 131, potassium 4.1, chloride 100, CO2  25, BUN 12, creatinine 1.07, glucose 95.  CK 76, MB 2.8, troponin-I  0.03.   DISPOSITION:  Patient is being discharged home today in good condition.   FOLLOWUP PLANS AND APPOINTMENTS:  He will have a followup outpatient  adenosine Myoview at [facility] on May 30 at 7:15 a.m.  He will  then follow up with Dr. Juanda Chance on June 18 at 12 p.m.  Prior to all of  this he has Coumadin Clinic followup on May 22 at 9:45 a.m.   DISCHARGE MEDICATIONS:  Coumadin as previously prescribed.   OUTSTANDING LAB STUDIES:  None.   DURATION OF DISCHARGE ENCOUNTER:  Forty minutes, including physician  time.      Nicolasa Ducking, ANP      Bruce R. Juanda Chance, MD, [facility]  Electronically Signed    CB/MEDQ  D:  09/18/2006  T:  09/18/2006  Job:  [id number]   cc:   Rosalyn Gess. Norins, MD

## 2010-09-13 NOTE — H&P (Signed)
NAME:  Roy Blackwell, Roy Blackwell                ACCOUNT NO.:  [id number]   MEDICAL RECORD NO.:  [id number]          PATIENT TYPE:  EMS   LOCATION:  MAJO                         FACILITY:  [facility]   PHYSICIAN:  Jonelle Sidle, MD DATE OF BIRTH:  1929/03/19   DATE OF ADMISSION:  09/16/2006  DATE OF DISCHARGE:                              HISTORY & PHYSICAL   PRIMARY CARDIOLOGIST:  Everardo Beals. Juanda Chance, M.D., [facility]   PRIMARY CARE PHYSICIAN:  Rosalyn Gess. Norins, M.D.   REASON FOR ADMISSION:  Syncope and abnormal electrocardiogram.   HISTORY OF PRESENT ILLNESS:  Roy Blackwell is a pleasant 77 year old male  with a history of longterm Coumadin therapy secondary to  thrombophlebitis and history of deep venous thrombosis, chronic stasis  ulcers involving the lower extremities, remote hypertension although not  managed chronically with medications, hyperlipidemia, and prostate  cancer status post previous radiation therapy.  I also see in Dr.  Regino Schultze note from June of 2006 that the patient had an abnormal  electrocardiogram at that time with description of T wave inversion in  the inferior and anterolateral leads, although I do not have the actual  tracing at hand.   [name] states that he has been in his usual state of health, taking  Coumadin regularly and without any significant problems with chest pain,  palpitations, or dizziness.  He was in church today and was standing  reading a prayer, and he states that he suddenly bent over to the right,  although he does not think that he lost consciousness.  He states that  his wife told him he felt like he was dead weight and they had to have  a friend help ease him down into the pew.  He was put in a wheelchair  and taken to the emergency department for further evaluation.  At this  point, he denies having any active symptoms.  His electrocardiogram  today is abnormal, showing sinus rhythm at 63 beats per minute with  increased voltage and fairly diffuse T  wave inversions, deep in the  anterolateral leads.  His initial troponin I level is less than 0.05.  His INR is subtherapeutic at 1.3.  He denies having any prior problems  with pulmonary embolus.  He also states that he has not been short of  breath.  He has had no cough and has had no hemoptysis.  His telemetry  in the emergency department has been fairly unremarkable.   ALLERGIES:  NO KNOWN DRUG ALLERGIES.   PRESENT MEDICATIONS:  Coumadin 5 mg p.o. daily, except for 2.5 mg p.o.  on Thursdays.   PAST MEDICAL HISTORY:  As outlined above.  He denies any known history  of cardiovascular disease or myocardial infarction.   SOCIAL HISTORY:  The patient is married.  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  No active  problems with tobacco or alcohol use.   REVIEW OF SYSTEMS:  As described in the history of present illness.  In  addition, he states that he has been somewhat more  fatigued in the last  three or four days.  He states that he perhaps has not been eating and  drinking quite as much.   FAMILY HISTORY:  Family history is reviewed and is noncontributory at  this point.   PHYSICAL EXAMINATION:  VITAL SIGNS:  Blood pressure today is 122/69,  heart rate is in the 70s in sinus rhythm.  He is not hypoxic.  Respirations are nonlabored at rest.  He is afebrile.  GENERAL:  He is a normally nourished-appearing male in no acute  distress.  HEENT:  Conjunctivae and lids normal.  Oropharynx is clear.  NECK:  Supple.  No elevated jugular venous distension.  No bruits.  No  thyromegaly was noted.  LUNGS:  Generally clear without labored breathing.  Somewhat rhonchorous  breath sounds towards the bases.  No wheezing noted.  CARDIAC:  Regular rate and rhythm.  No loud systolic murmur or  pericardial rub.  No S3 gallop is obvious.  ABDOMEN:  Soft, nontender.  No hepatomegaly, no loud bruits.  EXTREMITIES:  Trace edema.  He does have  compression stockings in place  bilaterally.  Distal pulses are diminished, 1+.  SKIN:  Warm and dry.  MUSCULOSKELETAL:  No kyphosis is noted.  NEURO/PSYCHIATRIC:  The patient alert and oriented x3.  Affect is  normal.   LABORATORY DATA:  WBCs 10.4, hemoglobin 14.5, hematocrit 42.3, platelets  358.  INR 1.3.  Sodium 134, potassium 4.1, chloride 104, glucose 106,  BUN 13, creatinine 1.3.  Myoglobin 126.  Troponin I less than 0.05, CK-  MB less than 1.0.   IMPRESSION AND RECOMMENDATIONS:  1. Episode of possible syncope or at least near-syncope based on      description.  Electrocardiogram is abnormal, as described above,      although these changes may be chronic based on an office note from      2006 (actual tracing not available as of yet).  There has been no      recent chest pain, palpitations, or breathlessness.  Initial      cardiac markers are reassuring.  Telemetry at this point has been      unremarkable.  I do note that the international normalized ratio is      subtherapeutic, and he does have a history of previous      thrombophlebitis and reportedly deep venous thrombosis.  The      possibility of a pulmonary embolus could also be considered,      although he is not hypoxic or tachycardic at this time.  2. No described history of cardiovascular disease or myocardial      infarction.  3. Reported history of hypertension in the past, although not managed      chronically with medications.  He is normotensive today.  4. Possible hyperlipidemia based on limited information.   PLAN:  I discussed the situation with the patient.  We will admit him  for further evaluation.  He has been scheduled by the emergency  department staff to undergo a head CT scan, and to this I will add a  contrasted CT scan of the chest to assess for a pulmonary embolus.  His  creatinine is 1.3, and we will plan to hydrate him following the procedure as well.  Cardiac markers will be cycled, and we  will plan  telemetry monitoring and an echocardiogram to assess cardiac structure  and function.  He will be placed on heparin for the time being, given  subtherapeutic  Coumadin.  Pending further testing, further plans to  follow.      Jonelle Sidle, MD  Electronically Signed     SGM/MEDQ  D:  09/16/2006  T:  09/16/2006  Job:  [id number]   cc:   Everardo Beals. Juanda Chance, MD, [facility]  Rosalyn Gess. Norins, MD

## 2010-09-13 NOTE — Assessment & Plan Note (Signed)
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  He presented to  the hospital with presyncope as became light headed while standing at  church and he had to lean to the right and eased himself down into the  pew but never lost consciousness.  CT of the head showed no intracranial  findings and CT of the chest showed no evidence of pulmonary embolus or  other active disease.  A 2D echo showed overall normal LV function,  ejection fraction 55-60%, no wall motion abnormalities, LV wall  thickness was mild to moderately increased.  Aortic valve thickness was  mildly increased, trivial AI.  Mild annular calcification. IV FED was 16  and LVP WED was 15.  He was scheduled for an outpatient adenosine  Myoview which showed normal contractility and thickening in all areas.  Overall LV function normal and negative for ischemia.   Since he has been home he denies any dizziness or presyncopal symptoms.  He is doing quite well.  He denies any chest pain, palpitations, or  shortness of breath.   CURRENT MEDICATIONS:  Coumadin as directed.   PHYSICAL EXAMINATION:  GENERAL APPEARANCE:  This is a pleasant, young-  looking 77 year old African-American male in no acute distress.  VITAL SIGNS:  Blood pressure 114/65, pulse 57, weight 155.  NECK:  Is without JVD, HR, bruit or thyroid enlargement.  LUNGS:  Clear anterior, posterior and lateral.  HEART:  Regular rate and rhythm at 57 beats per minute.  Normal S1 and  S2.  Positive S4.  No murmur, rub, bruit, thrill or heave noted.  ABDOMEN:  Soft without organomegaly, masses, lesions or abnormal  tenderness.  EXTREMITIES:  Without cyanosis,  clubbing or edema. Good distal pulses.   IMPRESSION:  1. Presyncope felt to be vasovagal.  2. Mild hypertropic cardiomyopathy based on 2D echo. Suspect this does      not have anything to do with his near syncope.  3. History of thrombophlebitis and deep venous thrombosis on chronic      Coumadin.  4. History of hypertension.  5. History of prostate cancer.  6. History of pulmonary nodule by chest x-ray.   PLAN AT THIS TIME:  The patient is doing quite well.  Dr. Juanda Chance feels  he may have a mild hypertropic cardiomyopathy based on his 2D echo but  does not feel like this has contributed to his presyncope.  He feels he  is stable and can see him back in 1 year, sooner as needed.      Jacolyn Reedy, PA-C  Electronically Signed      Everardo Beals. Juanda Chance, MD, [facility]  Electronically Signed   ML/MedQ  DD: 10/17/2006  DT: 10/17/2006  Job #: [id number]

## 2010-09-20 ENCOUNTER — Ambulatory Visit (INDEPENDENT_AMBULATORY_CARE_PROVIDER_SITE_OTHER): Payer: Medicare Other | Admitting: *Deleted

## 2010-09-20 DIAGNOSIS — I82409 Acute embolism and thrombosis of unspecified deep veins of unspecified lower extremity: Secondary | ICD-10-CM

## 2010-09-20 LAB — POCT INR: INR: 2.9

## 2010-10-07 ENCOUNTER — Ambulatory Visit (INDEPENDENT_AMBULATORY_CARE_PROVIDER_SITE_OTHER): Payer: Medicare Other | Admitting: *Deleted

## 2010-10-07 DIAGNOSIS — I82409 Acute embolism and thrombosis of unspecified deep veins of unspecified lower extremity: Secondary | ICD-10-CM

## 2010-10-14 ENCOUNTER — Telehealth: Payer: Self-pay

## 2010-10-14 NOTE — Telephone Encounter (Signed)
Roy Blackwell called the coumadin clinic to notify us that his last dose of Cipro was yesterday.  He was instructed to continue current Coumadin dosing.

## 2010-10-18 ENCOUNTER — Encounter: Payer: Medicare Other | Admitting: *Deleted

## 2010-11-04 ENCOUNTER — Ambulatory Visit (INDEPENDENT_AMBULATORY_CARE_PROVIDER_SITE_OTHER): Payer: Medicare Other | Admitting: *Deleted

## 2010-11-04 DIAGNOSIS — I82409 Acute embolism and thrombosis of unspecified deep veins of unspecified lower extremity: Secondary | ICD-10-CM

## 2010-11-11 ENCOUNTER — Ambulatory Visit (INDEPENDENT_AMBULATORY_CARE_PROVIDER_SITE_OTHER): Payer: Medicare Other | Admitting: General Surgery

## 2010-11-11 ENCOUNTER — Encounter (INDEPENDENT_AMBULATORY_CARE_PROVIDER_SITE_OTHER): Payer: Self-pay | Admitting: General Surgery

## 2010-11-11 VITALS — BP 138/84 | HR 64 | Temp 96.8°F | Ht 68.0 in | Wt 157.0 lb

## 2010-11-11 DIAGNOSIS — I872 Venous insufficiency (chronic) (peripheral): Secondary | ICD-10-CM

## 2010-11-11 DIAGNOSIS — L97919 Non-pressure chronic ulcer of unspecified part of right lower leg with unspecified severity: Secondary | ICD-10-CM

## 2010-11-11 DIAGNOSIS — L97909 Non-pressure chronic ulcer of unspecified part of unspecified lower leg with unspecified severity: Secondary | ICD-10-CM

## 2010-11-11 NOTE — Progress Notes (Signed)
Subjective:     Patient ID: Roy Blackwell, male   DOB: February 20, 1929, 77 y.o.   MRN: [id number]    BP 138/84  Pulse 64  Temp(Src) 96.8 F (36 C) (Temporal)  Ht 5\' 8"  (1.727 m)  Wt 157 lb (71.215 kg)  BMI 23.87 kg/m2    HPIeven though Roy Blackwell manage his Coumadin has no history of coronary artery disease atrial fibrillation problems. I placed him on Lasix 20 mg a day because of his edema that was occurring and this is a manage edema nicely Previously he's been on boots but since I have seen him his disease is such that never thought that it would be safe to put a boot on them for 3-4 weeks and thus we've done daily wraps and he 6 and the toe about every 2 days keep ithe wounds cleanhe hascultured Roy Blackwell on numerous occasions and is also cultured Pseudomonas but that's been intermittently and he is presently on Septra b.i.d. For his venous ulcers but is not presently on Cipro   Review of Systems     Objective:   Physical Exam On physical exam his lower extremities is no pitting edema he does have cultures pictures were taken on the right leg medial and lateral left leg predominantly medial and the ulcers have gradually increased in size of the last several years but nothing very dramatic I've got permission to shave the pictures to Dr. Arlina Robes and I'm wondered if the wound clinic possibly would have soft and all further we've not been able to offer since I can't see any improvement in the venous ulcers of the last year.     Assessment:       Plan:   Continue with the wound care as the patient is doing and I did reduce the Septra he has a prescription for 60 tablets with 2 refills I will review the pictures with Dr. Juliet Rude and may schedule an appointment for [name] with Dr. Juliet Rude Dr. Juliet Rude thanks he has softened all for that would be beneficial I will see [name] in one month.

## 2010-12-02 ENCOUNTER — Ambulatory Visit (INDEPENDENT_AMBULATORY_CARE_PROVIDER_SITE_OTHER): Payer: Medicare Other | Admitting: *Deleted

## 2010-12-02 DIAGNOSIS — I82409 Acute embolism and thrombosis of unspecified deep veins of unspecified lower extremity: Secondary | ICD-10-CM

## 2010-12-08 ENCOUNTER — Encounter (INDEPENDENT_AMBULATORY_CARE_PROVIDER_SITE_OTHER): Payer: Self-pay | Admitting: General Surgery

## 2010-12-09 ENCOUNTER — Encounter (INDEPENDENT_AMBULATORY_CARE_PROVIDER_SITE_OTHER): Payer: Self-pay | Admitting: General Surgery

## 2010-12-09 ENCOUNTER — Ambulatory Visit (INDEPENDENT_AMBULATORY_CARE_PROVIDER_SITE_OTHER): Payer: Medicare Other | Admitting: General Surgery

## 2010-12-09 VITALS — BP 120/62 | HR 68 | Temp 96.4°F | Wt 154.2 lb

## 2010-12-09 DIAGNOSIS — I82403 Acute embolism and thrombosis of unspecified deep veins of lower extremity, bilateral: Secondary | ICD-10-CM

## 2010-12-09 DIAGNOSIS — I82409 Acute embolism and thrombosis of unspecified deep veins of unspecified lower extremity: Secondary | ICD-10-CM

## 2010-12-09 DIAGNOSIS — I83009 Varicose veins of unspecified lower extremity with ulcer of unspecified site: Secondary | ICD-10-CM

## 2010-12-09 NOTE — Progress Notes (Signed)
Subjective:     Patient ID: Roy Blackwell, male   DOB: 1928/07/24, 77 y.o.   MRN: [id number]  HPITravis returns today now approximately 2 weeks from his last visit and I left a pictures of his ulcers at a Dr. Curlene Labrum office and talk with Dr. Nadine Counts severe and he said that the unna boots are still being used and with the history of MRSA maybe reapplied and the boots although more frequent initially would agree with that. The patient is also in agreement slowly after doing very little superficial debridement taking pictures to compare went ahead and applied lids on both lower extremities with a little bit of Betadine ointment and the open venous ulcers  Patient said that last week or earlier this week he noted sure it was quite working hard and had a lot of cramping in his legs at night I don't think this is an arterial insufficiency was not claudication but he's not had any strenuous plans plan for the weekend he of course knows how to remove any bits and if he's having more pain can removed and the boots and resumed the compressive wraps etc. and all seen in one week and hopefully if things are better we will be outlined and ablated to a more frequent basis and also carefully measuring whether the venous ulcers are decreasing in size   Review of Systems     Objective:   Physical ExamSee above no, the moisture and edema of the surrounding skin is better today and we will compare it next week with the pictures    Assessment:    Switch R. mode of management of this bilateral venous ulcers trying to resume unna boots patient presently on Septra and Coumadin clinically the scan does not look like the skin and he's grown the pseudomonas.     Plan:    see me in one week

## 2010-12-09 NOTE — Patient Instructions (Signed)
Limit her activity over the next few days while tenting keeping her legs elevated as much as possible. If you're having bad localized pain likely unna boot to tight you can remove the abated and resumed a ace wrap We will plan on placing a visits for 2 weeks next Friday

## 2010-12-16 ENCOUNTER — Encounter (INDEPENDENT_AMBULATORY_CARE_PROVIDER_SITE_OTHER): Payer: Self-pay | Admitting: General Surgery

## 2010-12-16 ENCOUNTER — Ambulatory Visit (INDEPENDENT_AMBULATORY_CARE_PROVIDER_SITE_OTHER): Payer: Medicare Other | Admitting: General Surgery

## 2010-12-16 VITALS — BP 118/60 | Temp 97.4°F | Wt 153.0 lb

## 2010-12-16 DIAGNOSIS — I87039 Postthrombotic syndrome with ulcer and inflammation of unspecified lower extremity: Secondary | ICD-10-CM

## 2010-12-16 NOTE — Patient Instructions (Signed)
Keep her legs elevated as much as possible and if you're having increasing pain you can remove the otherwise see me in one week

## 2010-12-16 NOTE — Progress Notes (Signed)
Subjective:     Patient ID: Roy Blackwell, male   DOB: [birth year], 77 y.o.   MRN: [id number]  HPIThe patient returns one week after I placed him and bilateral unna boots for chronic venous stasis also secondary to deep vein thrombophlebitis there is a little from the lead leg and he reports that he is not had the bad cramping pain like he had approximately 2 weeks earlier after doing some yardwork I removed the boots and there was a little area on the anterior above the ankle that he said he scratched the area because it was a itching. The other areas of the skin looked good and the venous ulcer is definitely smaller and just the one week boot. The right leg also looks better and he's not complain of pain over the lateral ulcer below the lateral ankle. I took photographs of the ulcers washes V. with warm water and soap then applied, and then Betadine ointment with a folded 4 x 4 over the ulceration and then applied the boot plus Ace bandage. I would like to see him in one week and hopefully week ago approximate 2 week intervals afterwards if things are continuing to improve. He continues on the Septra and Coumadin.   Review of Systems     Objective:   Physical Examsee above note     Assessment:     Improvement and venous stasis ulcer with switching from daily wraps to the boots    Plan:     I will see patient in one week inspect his wounds and reapplied the boots and hopefully we'll be able to go to 2 week intervals afterwards. No culture sent today patient continues on Septra plus his Coumadin

## 2010-12-23 ENCOUNTER — Encounter (INDEPENDENT_AMBULATORY_CARE_PROVIDER_SITE_OTHER): Payer: Self-pay | Admitting: General Surgery

## 2010-12-23 ENCOUNTER — Ambulatory Visit (INDEPENDENT_AMBULATORY_CARE_PROVIDER_SITE_OTHER): Payer: Medicare Other | Admitting: General Surgery

## 2010-12-23 VITALS — BP 120/68 | HR 75 | Temp 97.0°F | Ht 68.0 in | Wt 154.0 lb

## 2010-12-23 DIAGNOSIS — I87039 Postthrombotic syndrome with ulcer and inflammation of unspecified lower extremity: Secondary | ICD-10-CM | POA: Insufficient documentation

## 2010-12-23 NOTE — Progress Notes (Signed)
Subjective:     Patient ID: Roy Blackwell, male   DOB: 09-11-1928, 77 y.o.   MRN: [id number]  HPIThe patient returned to his boots were removed the ulcerations are definitely decreasing in size and no active infection after minimal debridement I reapplied the boots with Betadine ointment and a folded gauze over the ulcers.   Review of SystemsPatient says he is intermittently felt cool but no fever and wonders about his thyroid. I reviewed his records that Dr. Tedra Senegal had done in April and he had checked the TSH CBC liver tests and electrolytes and all were normal except for slightly low hematocrit of 33.     Objective:   Physical Exam After inspection of the venous ulcers I reapplied the boots on both lower extremities.      Assessment:    The patient has an appointment for next followup in 2 weeks and he will remove the birds at home and incised in the tub immediately before coming to the office in 3 week and start getting 2-3 weeks between the boot applications. I do think that in the boots were given him better compression and when he was applied and the Ace bandages on a daily basis. He continues on the Septra but there is no obvious active infection and not recultured the incisions in the past 2 months.     Plan:     See note above

## 2010-12-23 NOTE — Patient Instructions (Signed)
Continue your feet elevated when possible and plan on CMV in approximately 2 weeks. Removed the boots and soak in the tub immediately before you come to our office for new Unna boots. I hope to get a to a three-week schedule as things are improving with the boots compared with the usual wrap.

## 2010-12-28 ENCOUNTER — Ambulatory Visit (INDEPENDENT_AMBULATORY_CARE_PROVIDER_SITE_OTHER): Payer: Medicare Other | Admitting: *Deleted

## 2010-12-28 DIAGNOSIS — I82409 Acute embolism and thrombosis of unspecified deep veins of unspecified lower extremity: Secondary | ICD-10-CM

## 2011-01-06 ENCOUNTER — Ambulatory Visit (INDEPENDENT_AMBULATORY_CARE_PROVIDER_SITE_OTHER): Payer: Medicare Other | Admitting: General Surgery

## 2011-01-06 ENCOUNTER — Encounter (INDEPENDENT_AMBULATORY_CARE_PROVIDER_SITE_OTHER): Payer: Self-pay | Admitting: General Surgery

## 2011-01-06 VITALS — BP 110/60 | HR 80 | Temp 97.0°F | Wt 153.2 lb

## 2011-01-06 DIAGNOSIS — I87039 Postthrombotic syndrome with ulcer and inflammation of unspecified lower extremity: Secondary | ICD-10-CM

## 2011-01-06 NOTE — Progress Notes (Signed)
Subjective:     Patient ID: Roy Blackwell, male   DOB: Jul 02, 1928, 77 y.o.   MRN: [id number]  HPIMr. [place] returns and says he had to remove the until Monday 4 days ago because of drainage but that the ulcer is definitely smaller in all 3 areas and he's had no fever. He has clean waves at this time having been in the tub this morning are are to come into the office   Review of SystemsNo change on Septra and Lasix     Objective:   Physical ExamWounds are clean no odor no drainage and I don't see anything to culture we were not able to get 2 weeks with an Unna boot change in bowel panel his next appointment in approximately 10 days     Assessment:    Marked improvement since changing from the patient wrapped in twice a day to the under boot application approximately one month ago     Plan:          Patient returns studies and even not able to keep a little somewhat total of 10 days he started having some drainage from the right leg along the knee to the [place] and he continues on day Lasix and Septra. The wounds look very good today we had very little in the (the edges and no odor

## 2011-01-06 NOTE — Patient Instructions (Signed)
See me in approximately 10 days doesn't want to reculture to one shortly after taken off of the  unna boot

## 2011-01-18 ENCOUNTER — Ambulatory Visit (INDEPENDENT_AMBULATORY_CARE_PROVIDER_SITE_OTHER): Payer: Medicare Other | Admitting: General Surgery

## 2011-01-18 ENCOUNTER — Encounter (INDEPENDENT_AMBULATORY_CARE_PROVIDER_SITE_OTHER): Payer: Self-pay | Admitting: General Surgery

## 2011-01-18 VITALS — BP 140/72 | HR 78 | Resp 18 | Ht 68.0 in | Wt 156.6 lb

## 2011-01-18 DIAGNOSIS — I87039 Postthrombotic syndrome with ulcer and inflammation of unspecified lower extremity: Secondary | ICD-10-CM

## 2011-01-18 NOTE — Patient Instructions (Signed)
Try to lose weight lipids on 2 weeks of possible and continue all of her usual medication

## 2011-01-18 NOTE — Progress Notes (Signed)
Subjective:     Patient ID: SOCORRO EBRON, male   DOB: October 12, 1928, 77 y.o.   MRN: [id number]  HPIMr. Allene Dillon returns he is now approximately 2 weeks since last saw him but he was only able to go about 8 days before he said itching etc. as such the went ahead and removed the lids and has been doing very wraps the time for the last 3 or 4 days both legs look better he is given a hypertrophied skin off the cultures are clean there is no evidence of any we could like to see with the Pseudomonas and he's had previously and he continues on his Septra. I debrided very superficial running in the medial maleous it was both legs and and then applied the bed appointment pain in the legs or Betadine solution and applied to the buttocks bilaterally. Hopefully we can continue to try to get 2 weeks between boot change but if he needs to remove bits he will and continue with the [place] happens  Was no change in his medications and does not need any prescriptions   Review of Systems     Objective:   Physical Exam     Assessment:     Venous also is a history of chronic thrombophlebitis  varicose veins    Plan:    Trying to achieve good and good wrap and 4 two-week interval no change in his Septra he clinically improved and

## 2011-01-25 ENCOUNTER — Ambulatory Visit (INDEPENDENT_AMBULATORY_CARE_PROVIDER_SITE_OTHER): Payer: Medicare Other | Admitting: *Deleted

## 2011-01-25 DIAGNOSIS — I82409 Acute embolism and thrombosis of unspecified deep veins of unspecified lower extremity: Secondary | ICD-10-CM

## 2011-02-01 ENCOUNTER — Encounter (INDEPENDENT_AMBULATORY_CARE_PROVIDER_SITE_OTHER): Payer: Self-pay | Admitting: General Surgery

## 2011-02-01 ENCOUNTER — Ambulatory Visit (INDEPENDENT_AMBULATORY_CARE_PROVIDER_SITE_OTHER): Payer: Medicare Other | Admitting: General Surgery

## 2011-02-01 VITALS — BP 114/72 | HR 64 | Temp 96.4°F | Resp 16 | Ht 68.0 in | Wt 155.1 lb

## 2011-02-01 DIAGNOSIS — I87039 Postthrombotic syndrome with ulcer and inflammation of unspecified lower extremity: Secondary | ICD-10-CM

## 2011-02-01 NOTE — Progress Notes (Signed)
Subjective:     Patient ID: Roy Blackwell, male   DOB: 1928/10/26, 77 y.o.   MRN: [id number]  HPIPatient returns is now approximately 10 days after his last visit he said he was able to keep the implants on Thomas M.D. at that time it was not complain of discomfort it was a little bit better and he removed the boots and has been using wraps for last days his cultures are definitely improving that definitely decreasing in size and I think since we've switched to the boots and is to continue perhaps he is greatly improved. I took photographs of both areas to put in the chart just document improvement he continues on his Septra Lasix and says he's got off and off for about a month.   Review of Systems Current Outpatient Prescriptions  Medication Sig Dispense Refill  . furosemide (LASIX) 20 MG tablet Take 20 mg by mouth daily.        Marland Kitchen sulfamethoxazole-trimethoprim (BACTRIM DS,SEPTRA DS) 800-160 MG per tablet Take 1 tablet by mouth 2 (two) times daily.        Marland Kitchen warfarin (COUMADIN) 5 MG tablet Take as directed by the anticoagulation clinic.  60 tablet  3   History reviewed. No pertinent past surgical history.      Objective:   Physical ExamBP 114/72  Pulse 64  Temp 96.4 F (35.8 C)  Resp 16  Ht 5\' 8"  (1.727 m)  Wt 155 lb 2 oz (70.364 kg)  BMI 23.59 kg/m2 The patient's cultures are debrided a little bit definitely noted in that the granulation area is decreasing in size and little area on the right lateral below the ankle is nearly there's no marked we pinned O. evidence of a significant infection and hopefully we can possibly get to go to 2 week durationof applications.    Assessment:     See above    Plan:     See above

## 2011-02-01 NOTE — Patient Instructions (Signed)
Rather complaints as long as possible and let me see you for need but it's removed the boots and soak in tub to prior to coming to our office

## 2011-02-03 ENCOUNTER — Ambulatory Visit (INDEPENDENT_AMBULATORY_CARE_PROVIDER_SITE_OTHER): Payer: Medicare Other | Admitting: General Surgery

## 2011-02-14 ENCOUNTER — Ambulatory Visit (INDEPENDENT_AMBULATORY_CARE_PROVIDER_SITE_OTHER): Payer: Medicare Other | Admitting: General Surgery

## 2011-02-14 ENCOUNTER — Encounter (INDEPENDENT_AMBULATORY_CARE_PROVIDER_SITE_OTHER): Payer: Self-pay | Admitting: General Surgery

## 2011-02-14 VITALS — BP 138/60 | HR 100 | Temp 97.0°F | Resp 18 | Wt 158.0 lb

## 2011-02-14 DIAGNOSIS — I87039 Postthrombotic syndrome with ulcer and inflammation of unspecified lower extremity: Secondary | ICD-10-CM

## 2011-02-14 NOTE — Patient Instructions (Signed)
Removed and a boot at home prior to next visit but try to keep the boots: As long as possible

## 2011-02-14 NOTE — Progress Notes (Signed)
Subjective:     Patient ID: Roy Blackwell, male   DOB: [birth year], 77 y.o.   MRN: [id number]  HPI Mr. Tsuda returns and he was able to keep that it was total days which ieithere 8 or 9 days he says last a itching of the legs look we will irritation of the top of the left leg where he probably scratched himself.  But the ulcers are definitely clean there nice granulation there is non-hypertrophied skin and they are smalerl that I can remember.  He still on the Septra and Lasix and will try to go to about a 2 week interval on theuna boot. And I think the removal of theboot at home  before seeing the me is the best  a pattern for him to come in and he your as we are not able to scrub the legs before upon the boot are reapplied. I am trying to get a schedule of change in the boot on a 2 week time but he says that itching prevents him going that length of time. I'll plan on trying to see him in 2 weeks but I can see him a few days earlier.  Review of Systems  Current Outpatient Prescriptions  Medication Sig Dispense Refill  . furosemide (LASIX) 20 MG tablet Take 20 mg by mouth daily.        Marland Kitchen sulfamethoxazole-trimethoprim (BACTRIM DS,SEPTRA DS) 800-160 MG per tablet Take 1 tablet by mouth 2 (two) times daily.        Marland Kitchen warfarin (COUMADIN) 5 MG tablet Take as directed by the anticoagulation clinic.  60 tablet  3        Objective:   Physical ExamTemp 97 F (36.1 C)  Wt 158 lb (71.668 kg) The legs looked the best I can remember name Mott and hopefully the little ulcer on the right lateral meatal S. will be healed next and maybe the ulcers will be nearly closed in approximately one month. It is not any swelling or edema of the ankles and made no change on his antibiotics not cultured him recently     Assessment:    The patient is improving since we changed to boots instead of using Ace bandages and him changing the dressing daily at high     Plan:     See me two weeks

## 2011-02-22 ENCOUNTER — Ambulatory Visit (INDEPENDENT_AMBULATORY_CARE_PROVIDER_SITE_OTHER): Payer: Medicare Other | Admitting: *Deleted

## 2011-02-22 DIAGNOSIS — I82409 Acute embolism and thrombosis of unspecified deep veins of unspecified lower extremity: Secondary | ICD-10-CM

## 2011-02-22 MED ORDER — WARFARIN SODIUM 5 MG PO TABS: ORAL_TABLET | ORAL | Status: DC

## 2011-03-03 ENCOUNTER — Ambulatory Visit (INDEPENDENT_AMBULATORY_CARE_PROVIDER_SITE_OTHER): Payer: Medicare Other | Admitting: General Surgery

## 2011-03-03 ENCOUNTER — Encounter (INDEPENDENT_AMBULATORY_CARE_PROVIDER_SITE_OTHER): Payer: Self-pay | Admitting: General Surgery

## 2011-03-03 VITALS — BP 128/86 | HR 60 | Temp 97.3°F | Resp 20 | Ht 68.0 in | Wt 158.4 lb

## 2011-03-03 DIAGNOSIS — I87039 Postthrombotic syndrome with ulcer and inflammation of unspecified lower extremity: Secondary | ICD-10-CM

## 2011-03-03 NOTE — Progress Notes (Signed)
Subjective:     Patient ID: Roy Blackwell, male   DOB: 1928-07-15, 77 y.o.   MRN: [id number]  HPIA scratch returns and is doing very well the ultrasound the smallest that I can remember since first started seeing him little area on the right lateral below the ankle is nearly healed and the others measure approximately 2 cm at their widest width and are very clean he continues on day Septra DS b.i.d. no evidence of any active infection and he's taking Lasix 20 mg each day he had removed bits last night subtenon the toe and I reapplied boots on both legs at this time. We refilled the Septra DS 60 tablets one b.i.d. he's had 27th of the Lasix tablets and I'll see him in 2 weeks   Review of Systems     Objective:   Physical ExamBP 128/86  Pulse 60  Temp(Src) 97.3 F (36.3 C) (Temporal)  Resp 20  Ht 5\' 8"  (1.727 m)  Wt 158 lb 6 oz (71.838 kg)  BMI 24.08 kg/m2 See note above     Assessment:         Plan:     2 weeks

## 2011-03-20 ENCOUNTER — Encounter (INDEPENDENT_AMBULATORY_CARE_PROVIDER_SITE_OTHER): Payer: Self-pay | Admitting: General Surgery

## 2011-03-20 ENCOUNTER — Ambulatory Visit (INDEPENDENT_AMBULATORY_CARE_PROVIDER_SITE_OTHER): Payer: Medicare Other | Admitting: General Surgery

## 2011-03-20 VITALS — BP 110/60 | HR 64 | Temp 97.8°F | Resp 16 | Ht 68.0 in | Wt 159.8 lb

## 2011-03-20 DIAGNOSIS — I87039 Postthrombotic syndrome with ulcer and inflammation of unspecified lower extremity: Secondary | ICD-10-CM

## 2011-03-20 NOTE — Progress Notes (Signed)
Subjective:     Patient ID: Roy Blackwell, male   DOB: November 02, 1928, 77 y.o.   MRN: [id number]  HPIGrace returns he is now proximate to have  2.5 weeks following his last visit he said he was able to keep the una boot on until Saturday he was having little moisture along the ulcers. He has used Ace bandage and sites in the tub on a daily basis for the last 2 days and his legs look the best that I can remember from the time I first met him in September of 7. I tried lead and the patient wrapped with Ace bandages soaking in the center at home twice a day in the ulcers got larger he has had previous problems with both MRSA and also set of presently is still on Septra DS but the lens itself had no edema and I didn't recultured area day but I expect that the cultures were probably would not showed MRSA at this time he still on the Coumadin 5 mg a day and Lasix 20 mg a day. I think that the other boots are certainly awake surprised her thrill that the widest area with his old left leg nail 2 cm and the right leg is 1.5 cm and this is somewhat smaller than when we were not use boots. Hopefully we can continue to try to get him on a rock divided every 3 week basis on the boots and I think with him taken the wrap and off at least the night prior to his visit with me has certainly been beneficial and he's lost his legs. Early in the tub.   Review of Systems Current Outpatient Prescriptions  Medication Sig Dispense Refill  . furosemide (LASIX) 20 MG tablet Take 20 mg by mouth daily.        Marland Kitchen sulfamethoxazole-trimethoprim (BACTRIM DS,SEPTRA DS) 800-160 MG per tablet Take 1 tablet by mouth 2 (two) times daily.        Marland Kitchen warfarin (COUMADIN) 5 MG tablet Take As Directed per Coumadin Clinic   60 tablet  3        Objective:   Physical ExamBP 110/60  Pulse 64  Temp(Src) 97.8 F (36.6 C) (Temporal)  Resp 16  Ht 5\' 8"  (1.727 m)  Wt 159 lb 12.8 oz (72.485 kg)  BMI 24.30 kg/m2 The legs I got a new photographs of  both the left and right with a ruler and we applied and the boots with Betadine and a rolled up or folded up 4 x 4 over the actual ulcers and a little ulcer on the lateral below the right tibialis is no longer painful and they really not a significant ulcer on exam at that area today he doesn't need any refills of his Septra or the Lasix and will plan on seeing on December 7 for the next visit if we could keep the boots onuntil the night before visit I would prefer. If he is having drainage or swelling or any questions he can remove the boots and resume the ace wrapes.     Assessment:    bilateral venous ulcers improving Plan:     Next visit 12/7

## 2011-03-20 NOTE — Patient Instructions (Signed)
Keep the una boots on until the night before next visit if possible

## 2011-03-22 ENCOUNTER — Ambulatory Visit (INDEPENDENT_AMBULATORY_CARE_PROVIDER_SITE_OTHER): Payer: Medicare Other | Admitting: *Deleted

## 2011-03-22 DIAGNOSIS — I82409 Acute embolism and thrombosis of unspecified deep veins of unspecified lower extremity: Secondary | ICD-10-CM

## 2011-03-22 LAB — POCT INR: INR: 2.6

## 2011-04-07 ENCOUNTER — Ambulatory Visit (INDEPENDENT_AMBULATORY_CARE_PROVIDER_SITE_OTHER): Payer: Medicare Other | Admitting: General Surgery

## 2011-04-07 ENCOUNTER — Encounter (INDEPENDENT_AMBULATORY_CARE_PROVIDER_SITE_OTHER): Payer: Self-pay | Admitting: General Surgery

## 2011-04-07 VITALS — BP 116/82 | HR 66 | Temp 96.8°F | Resp 16 | Ht 68.0 in | Wt 160.0 lb

## 2011-04-07 DIAGNOSIS — I87039 Postthrombotic syndrome with ulcer and inflammation of unspecified lower extremity: Secondary | ICD-10-CM

## 2011-04-07 NOTE — Patient Instructions (Signed)
See me in two weeks. continue Lasix  and septra

## 2011-04-07 NOTE — Progress Notes (Signed)
Subjective:     Patient ID: KHAIR CHASTEEN, male   DOB: [birth year], 77 y.o.   MRN: [id number]  HPIMr. [place] Creek returns his ulcers are continuing to contract there is very little drainage and the surrounding skin is not at the edema etc. as he's had numerous previous visits he still on the Septra DS and not recultured the area but he still on Lasix 20 mg a day also he removed the of the chest today is sent twice in the and everything looks good and I re\re photographed the areas today leave her original size although most maximum was probably larger than he is male that I think that he'll continue to need a chronic boots applied and whether he needs the Septra not I'm not sure if he stopped it he is always come back very quickly with martial or on the cultures and for that reason 5 continue the Septra and he is tolerating it very well he is on chronic Coumadin and is followed in the [facility] but he doesn't actually have a heart issue.   Review of Systems Current Outpatient Prescriptions  Medication Sig Dispense Refill  . furosemide (LASIX) 20 MG tablet Take 20 mg by mouth daily.        Marland Kitchen sulfamethoxazole-trimethoprim (BACTRIM DS,SEPTRA DS) 800-160 MG per tablet Take 1 tablet by mouth 2 (two) times daily.        Marland Kitchen warfarin (COUMADIN) 5 MG tablet Take As Directed per Coumadin Clinic   60 tablet  3  No Known Allergies       Objective:   Physical Exam BP 116/82  Pulse 66  Temp(Src) 96.8 F (36 C) (Temporal)  Resp 16  Ht 5\' 8"  (1.727 m)  Wt 160 lb (72.576 kg)  BMI 24.33 kg/m2 Mr. Rosalyn Gess the legs the pictures a photograph we boots were applied he did have a very hypertrophic left great toenail trimmed and did not she get into the really matrix area. He'll see me in 3 weeks for repeat boot application and I gave him a refill so to SeptraDS bid #60 and the Lasix 20 mgdaily #90      Assessment:    Chronic bilateral venous ulcers improved still upon the boots on q. 2-3 Week Pl.       Plan:     2 weeks

## 2011-04-21 ENCOUNTER — Encounter (INDEPENDENT_AMBULATORY_CARE_PROVIDER_SITE_OTHER): Payer: Self-pay | Admitting: General Surgery

## 2011-04-21 ENCOUNTER — Ambulatory Visit (INDEPENDENT_AMBULATORY_CARE_PROVIDER_SITE_OTHER): Payer: Medicare Other | Admitting: General Surgery

## 2011-04-21 VITALS — BP 128/70 | HR 68 | Temp 97.9°F | Resp 16 | Ht 68.0 in | Wt 158.2 lb

## 2011-04-21 DIAGNOSIS — I87039 Postthrombotic syndrome with ulcer and inflammation of unspecified lower extremity: Secondary | ICD-10-CM

## 2011-04-21 NOTE — Patient Instructions (Signed)
Try to get 2 weeks or longer between the application and Zella Ball will coordinate your boot changes with  [employer] or [facility]  Continue Septra DS Bid and Lasix 20 mg daily

## 2011-04-21 NOTE — Progress Notes (Signed)
Patient ID: IZAIYAH KLEINMAN, male   DOB: Sep 02, 1928, 77 y.o.   MRN: [id number] BP 128/70  Pulse 68  Temp(Src) 97.9 F (36.6 C) (Temporal)  Resp 16  Ht 5\' 8"  (1.727 m)  Wt 158 lb 3.2 oz (71.759 kg)  BMI 24.05 kg/m2 Mr. Syble Creek return and he removed and the blue just today the feet ankles and ulcers looked very good and there continued to contract nicely he's only Septra DS b.i.d. Lasix 20 mg a day and also Coumadin and I reapplied the boots to both lower extremities but no actual debridement was made. The little ulcer below the right lateral ankle is nearly healed and he will be seen in 2 weeks by Eunice Blase and Robin under Dr. Allene Pyo following and hopefully we will be managed predominantly by our nurse. With Dr. Lurene Shadow no longer working in the wound clinic I think Mr. Sy[place] Creek will be better managed by [place]. His problem are chronic but hopefully if we can get the ulcers to completely heal he could be managed by compression stockings I have not cultured him over the last several months but every time that I had to stop this after in the his ulcers and inflammation just become floridly worse and MRSA or Pseudomonas is cultured.

## 2011-05-03 ENCOUNTER — Ambulatory Visit (INDEPENDENT_AMBULATORY_CARE_PROVIDER_SITE_OTHER): Payer: Medicare Other | Admitting: *Deleted

## 2011-05-03 DIAGNOSIS — I82409 Acute embolism and thrombosis of unspecified deep veins of unspecified lower extremity: Secondary | ICD-10-CM

## 2011-05-03 LAB — POCT INR: INR: 2

## 2011-05-04 ENCOUNTER — Encounter (INDEPENDENT_AMBULATORY_CARE_PROVIDER_SITE_OTHER): Payer: Self-pay

## 2011-05-04 ENCOUNTER — Ambulatory Visit (INDEPENDENT_AMBULATORY_CARE_PROVIDER_SITE_OTHER): Payer: Medicare Other | Admitting: Surgery

## 2011-05-04 VITALS — BP 118/48 | HR 100 | Temp 97.0°F | Resp 16 | Ht 68.0 in | Wt 156.4 lb

## 2011-05-04 DIAGNOSIS — I87039 Postthrombotic syndrome with ulcer and inflammation of unspecified lower extremity: Secondary | ICD-10-CM

## 2011-05-04 NOTE — Progress Notes (Signed)
[facility]  Roy Kin, MD,  FACS [address] [place]  [address] [place], [place]    [address] Phone:  [phone] FAX:  [phone]   Re:   Roy Blackwell DOB:   03/02/1929 MRN:   [id number]  ASSESSMENT AND PLAN: 1.  Bilateral chronic venous stasis ulcers, medially above his ankles.  Changing leg Unna boot and dressings every 2 to 3 weeks.  2.  On coumadin.  Followed through the [facility] coumadin clinic.  Las INR 2.6.  3.  Hypertrophic cardiomyopathy.  Last seen by Roy Blackwell. Roy Blackwell 08/15/2010.  HISTORY OF PRESENT ILLNESS: Chief Complaint  Patient presents with  . Wound Check    left and right lower legs    Roy Blackwell is a 78 y.o. (DOB: 11-21-1928)  AA male who is a patient of Roy Regulus, MD, MD and comes to me today for bilateral chronic venous stasis ulcers.  Though it list Dr. Debby Blackwell as his PCP, I cannot find any notes by Dr. Debby Blackwell in the chart.  Roy Blackwell was managed by the wound care center, then by Roy Blackwell.  With Roy Blackwell retirement, he asked if I would look after this patient.  He has had success with Una boots and compression to improve these ulcer.  This is a long term process, but certainly compared to photos from Aug 2012, there has been improvement in ulcers on both lower extremities.  He was last seen by Roy Blackwell 04/21/2011.  The patient has not new problem or concern.  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  PHYSICAL EXAM: BP 118/48  Pulse 100  Temp(Src) 97 F (36.1 C) (Temporal)  Resp 16  Ht 5\' 8"  (1.727 m)  Wt 156 lb 6.4 oz (70.943 kg)  BMI 23.78 kg/m2  Lower extremities:  2 x 6 cm ulcer medial left leg and 2 x 4 cm ulcer medial right leg.  We will plan to photograph these every 1 to 2 months.  DATA REVIEWED: Old photos and the chart.  Roy Kin, MD, FACS Office:  [phone]

## 2011-05-18 ENCOUNTER — Encounter (INDEPENDENT_AMBULATORY_CARE_PROVIDER_SITE_OTHER): Payer: Self-pay | Admitting: Surgery

## 2011-05-18 ENCOUNTER — Ambulatory Visit (INDEPENDENT_AMBULATORY_CARE_PROVIDER_SITE_OTHER): Payer: Medicare Other | Admitting: Surgery

## 2011-05-18 VITALS — BP 122/58 | HR 80 | Temp 97.0°F | Resp 16 | Ht 68.0 in | Wt 157.2 lb

## 2011-05-18 DIAGNOSIS — I87039 Postthrombotic syndrome with ulcer and inflammation of unspecified lower extremity: Secondary | ICD-10-CM

## 2011-05-18 NOTE — Progress Notes (Signed)
[facility]  Roy Kin, MD,  FACS [address] [place]  [address] [place], [place]    [address] Phone:  [phone] FAX:  [phone]   Re:   Roy Blackwell DOB:   07/23/1928 MRN:   [id number]  ASSESSMENT AND PLAN: 1.  Bilateral chronic venous stasis ulcers, medially above his ankles.  Changing leg Unna boot and dressings every 2 to 3 weeks.  The wounds are continuing to improve.  We will photo every other time.  2.  On coumadin.  Followed through the [facility] coumadin clinic.  Last INR 2.6.  3.  Hypertrophic cardiomyopathy.  Last seen by Roy Blackwell. Karie Blackwell. Roy Blackwell 08/15/2010.  HISTORY OF PRESENT ILLNESS: Chief Complaint  Patient presents with  . Wound Check    left and right lower leg wounds    Roy Blackwell is a 78 y.o. (DOB: 1928-06-21)  AA male who is a patient of Roy Meres, MD, MD and comes to me today for bilateral chronic venous stasis ulcers.  Though it list Roy Blackwell. Debby Blackwell as his PCP, I cannot find any notes by Roy Blackwell. Debby Blackwell in the chart.  [name] was managed by the wound care center, then by Roy Blackwell. Zachery Blackwell.  With Roy Blackwell. Annette Blackwell retirement, he asked if I would look after this patient.  He has had success with Una boots and compression to improve these ulcer.  This is a long term process, but certainly compared to photos from Aug 2012, there has been improvement in ulcers on both lower extremities.  The patient has not new problem or concern.  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  PHYSICAL EXAM: BP 122/58  Pulse 80  Temp(Src) 97 F (36.1 C) (Temporal)  Resp 16  Ht 5\' 8"  (1.727 m)  Wt 157 lb 3.2 oz (71.305 kg)  BMI 23.90 kg/m2  Lower extremities:  2 x 6 cm ulcer medial left leg and 1.5 x 3 cm ulcer medial right leg.  We will plan to photograph these every 1 to 2 months.  DATA REVIEWED: Old photos and the chart.  Roy Kin, MD, FACS Office:  [phone]

## 2011-06-07 ENCOUNTER — Ambulatory Visit (INDEPENDENT_AMBULATORY_CARE_PROVIDER_SITE_OTHER): Payer: Medicare Other | Admitting: Surgery

## 2011-06-07 ENCOUNTER — Encounter (INDEPENDENT_AMBULATORY_CARE_PROVIDER_SITE_OTHER): Payer: Self-pay | Admitting: Surgery

## 2011-06-07 VITALS — BP 132/74 | HR 84 | Temp 98.0°F | Resp 16 | Ht 68.0 in | Wt 157.6 lb

## 2011-06-07 DIAGNOSIS — I87039 Postthrombotic syndrome with ulcer and inflammation of unspecified lower extremity: Secondary | ICD-10-CM

## 2011-06-07 NOTE — Progress Notes (Signed)
[facility]  Roy Kin, MD,  FACS [address] [place]  [address] [place], [place]    [address] Phone:  [phone] FAX:  [phone]   Re:   Roy Blackwell DOB:   August 15, 1928 MRN:   [id number]  ASSESSMENT AND PLAN: 1.  Bilateral chronic venous stasis ulcers, medially above his ankles.  Changing leg Unna boot and dressings every 2 to 3 weeks.  He grew out Pseudomonas on cultures in May 2012 and has been on Septra since then.  According to Roy Blackwell, every time he stopped the antibiotic, the wounds got worse.  So I will continue the Septra for now.  Note, it appears that Roy Blackwell was also giving Roy Blackwell Lasix.  The wounds are Blackwell, not really changed much since I have seen him.  We will photo every other time.  2.  On coumadin.  Followed through the [facility] coumadin clinic.  Last INR 2.0.  (05/03/2011)  3.  Hypertrophic cardiomyopathy.  Last seen by Dr. Karie Blackwell. Roy Blackwell 08/15/2010.  HISTORY OF PRESENT ILLNESS: Chief Complaint  Patient presents with  . Follow-up    reck leg wounds    Roy Blackwell is a 78 y.o. (DOB: October 09, 1928)  AA male who is a patient of Roy Meres, MD, MD and comes to me today for bilateral chronic venous stasis ulcers.  Though it list Dr. Debby Blackwell as his PCP, I cannot find any notes by Dr. Debby Blackwell in the chart.  Because Roy Blackwell sees several doctors, he does not see Dr. Debby Blackwell (except when a problem arises).  Roy Blackwell was managed by the wound care center, then by Roy Blackwell.  With Dr. Annette Blackwell retirement, he asked if I would look after this patient.  He has had success with Una boots and compression to improve these ulcer.  This is a long term process, but certainly compared to photos from Aug 2012, there has been improvement in ulcers on both lower extremities.  The patient has not new problem or concern.  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  PHYSICAL EXAM: BP 132/74  Pulse 84  Temp(Src) 98 F (36.7 C)  (Temporal)  Resp 16  Ht 5\' 8"  (1.727 m)  Wt 157 lb 9.6 oz (71.487 kg)  BMI 23.96 kg/m2  Lower extremities:  2 x 6 cm ulcer medial left leg and 2 x 3 cm ulcer medial right leg.  Both wounds are clean.  We will plan to photograph these every 1 to 2 months.  DATA REVIEWED: Old photos and the chart.  Roy Kin, MD, FACS Office:  [phone]

## 2011-06-14 ENCOUNTER — Ambulatory Visit (INDEPENDENT_AMBULATORY_CARE_PROVIDER_SITE_OTHER): Payer: Medicare Other | Admitting: *Deleted

## 2011-06-14 DIAGNOSIS — I82409 Acute embolism and thrombosis of unspecified deep veins of unspecified lower extremity: Secondary | ICD-10-CM

## 2011-06-22 ENCOUNTER — Encounter (INDEPENDENT_AMBULATORY_CARE_PROVIDER_SITE_OTHER): Payer: Self-pay | Admitting: Surgery

## 2011-06-22 ENCOUNTER — Ambulatory Visit (INDEPENDENT_AMBULATORY_CARE_PROVIDER_SITE_OTHER): Payer: Medicare Other | Admitting: Surgery

## 2011-06-22 VITALS — BP 128/58 | HR 88 | Temp 97.8°F | Resp 14 | Ht 68.0 in | Wt 154.0 lb

## 2011-06-22 DIAGNOSIS — I87039 Postthrombotic syndrome with ulcer and inflammation of unspecified lower extremity: Secondary | ICD-10-CM

## 2011-06-22 NOTE — Progress Notes (Signed)
[facility]  Roy Kin, MD,  FACS [address] [place]  [address] [place], [place]    [address] Phone:  [phone] FAX:  [phone]   Re:   Roy Blackwell DOB:   [birth year] MRN:   [id number]  ASSESSMENT AND PLAN: 1.  Bilateral chronic venous stasis ulcers, medially above his ankles.  Changing leg Unna boot and dressings every 2 weeks.  He grew out Pseudomonas on cultures in May 2012 and has been on Septra since then.  According to Dr. Zachery Blackwell, every time he stopped the antibiotic, the wounds got worse.  So I will continue the Septra for now.  Note, it appears that Roy Blackwell was also giving Roy Blackwell.  I will stop that today and we'll see how he does.  The wounds are stable, not really changed much since I have seen him.  We will photo every other time. (once every month or two)  2.  On coumadin.  Followed through the [facility] coumadin clinic.  Last INR 2.0.  (06/14/2011)  3.  Hypertrophic cardiomyopathy.  Last seen by Dr. Karie Schwalbe. [name] 08/15/2010.  To see him again in April 2013.  HISTORY OF PRESENT ILLNESS: Chief Complaint  Patient presents with  . Wound Check    Right and Left lower leg wounds    Roy Blackwell is a 78 y.o. (DOB: June 26, 1928)  AA male who is a patient of Arvilla Meres, MD, MD and comes to me today for bilateral chronic venous stasis ulcers.  Though it list Dr. Debby Bud as his PCP, I cannot find any notes by Dr. Debby Bud in the chart.  Because Roy Blackwell sees several doctors, he does not see Dr. Debby Bud (except when a problem arises).  Roy Blackwell was managed by the wound care center, then by Dr. Zachery Blackwell.  With Dr. Annette Stable retirement, he asked if I would look after this patient.  He has had success with Una boots and compression to improve these ulcer.  This is a long term process, but certainly compared to photos from Aug 2012, there has been improvement in ulcers on both lower extremities.  The patient has not new problem or concern.  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  PHYSICAL EXAM: BP 128/58  Pulse 88  Temp(Src) 97.8 F (36.6 C) (Temporal)  Resp 14  Ht 5\' 8"  (1.727 m)  Wt 154 lb (69.854 kg)  BMI 23.42 kg/m2  Lower extremities:  2 x  5.2 cm ulcer medial left leg and  1.6 x 4 cm ulcer medial right leg.    Both wounds are clean.  We will plan to photograph these every 1 to 2 months.  DATA REVIEWED: Old photos and the chart.  Roy Kin, MD, FACS Office:  [phone]

## 2011-07-07 ENCOUNTER — Encounter (INDEPENDENT_AMBULATORY_CARE_PROVIDER_SITE_OTHER): Payer: Self-pay | Admitting: Surgery

## 2011-07-07 ENCOUNTER — Ambulatory Visit (INDEPENDENT_AMBULATORY_CARE_PROVIDER_SITE_OTHER): Payer: Medicare Other | Admitting: Surgery

## 2011-07-07 VITALS — BP 126/72 | HR 68 | Temp 97.3°F | Resp 16 | Ht 68.0 in | Wt 151.6 lb

## 2011-07-07 DIAGNOSIS — I87039 Postthrombotic syndrome with ulcer and inflammation of unspecified lower extremity: Secondary | ICD-10-CM

## 2011-07-07 NOTE — Progress Notes (Signed)
[facility]  [name], MD,  FACS [address] [place]  [address] [place], [place]    [address] Phone:  [phone] FAX:  [phone]   Re:   Roy Blackwell DOB:   05-21-1928 MRN:   [id number]  ASSESSMENT AND PLAN: 1.  Bilateral chronic venous stasis ulcers, medially above his ankles.  Changing leg Unna boot and dressings every 2 weeks.  He grew out Pseudomonas on cultures in May 2012 and has been on Septra since then.  According to Dr. Zachery Dakins, every time he stopped the antibiotic, the wounds got worse.  So I will continue the Septra for now.  Note, it appears that Zachery Dakins was also giving Roy Blackwell.  I will stop that today and we'll see how he does.  The wounds are stable, and have not really changed much since I have seen him.  We will photo about every 2 to 3 months as long as they are stable.  2.  On coumadin.  Followed through the [place] coumadin clinic.  Last INR 2.0.  (06/14/2011)  3.  Hypertrophic cardiomyopathy.  Last seen by Dr. Karie Schwalbe. [name] 08/15/2010.  To see him again in April 2013.  HISTORY OF PRESENT ILLNESS: Chief Complaint  Patient presents with  . Wound Check    Roy Blackwell is a 78 y.o. (DOB: May 09, 1928)  AA male who is a patient of Arvilla Meres, MD, MD and comes to me today for bilateral chronic venous stasis ulcers.  Though it list Dr. Debby Bud as his PCP, I cannot find any notes by Dr. Debby Bud in the chart.  Because Roy Blackwell sees several doctors, he does not see Dr. Debby Bud (except when a problem arises).  Roy Blackwell was managed by the wound care center, then by Dr. Zachery Dakins.  With Dr. Annette Stable retirement, he asked if I would look after this patient.  He has had success with Una boots and compression to improve these ulcer.  This is a long term process, but certainly compared to photos from Aug 2012, there has been improvement in ulcers on both lower extremities.  The patient has not new problem or concern.  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  PHYSICAL EXAM: BP 126/72  Pulse 68  Temp(Src) 97.3 F (36.3 C) (Temporal)  Resp 16  Ht 5\' 8"  (1.727 m)  Wt 151 lb 9.6 oz (68.765 kg)  BMI 23.05 kg/m2  Lower extremities:  2 x  5.2 cm ulcer medial left leg (unchanged) and  1.4 x 3.1 cm ulcer medial right leg.    Both wounds are clean.  We will plan to photograph these every 2 months.  DATA REVIEWED: Old photos and the chart.  [name], MD, FACS Office:  [phone]

## 2011-07-21 ENCOUNTER — Encounter (INDEPENDENT_AMBULATORY_CARE_PROVIDER_SITE_OTHER): Payer: Self-pay | Admitting: Surgery

## 2011-07-21 ENCOUNTER — Ambulatory Visit (INDEPENDENT_AMBULATORY_CARE_PROVIDER_SITE_OTHER): Payer: Medicare Other | Admitting: Surgery

## 2011-07-21 VITALS — BP 132/68 | HR 68 | Temp 97.4°F | Resp 16 | Ht 68.0 in | Wt 149.2 lb

## 2011-07-21 DIAGNOSIS — I87039 Postthrombotic syndrome with ulcer and inflammation of unspecified lower extremity: Secondary | ICD-10-CM

## 2011-07-22 NOTE — Progress Notes (Signed)
Nurse visit - Seen by Eunice Blase.

## 2011-07-26 ENCOUNTER — Ambulatory Visit (INDEPENDENT_AMBULATORY_CARE_PROVIDER_SITE_OTHER): Payer: Medicare Other | Admitting: *Deleted

## 2011-07-26 DIAGNOSIS — I82409 Acute embolism and thrombosis of unspecified deep veins of unspecified lower extremity: Secondary | ICD-10-CM

## 2011-08-08 ENCOUNTER — Encounter (INDEPENDENT_AMBULATORY_CARE_PROVIDER_SITE_OTHER): Payer: Self-pay | Admitting: Surgery

## 2011-08-08 ENCOUNTER — Ambulatory Visit (INDEPENDENT_AMBULATORY_CARE_PROVIDER_SITE_OTHER): Payer: Medicare Other | Admitting: Surgery

## 2011-08-08 VITALS — BP 124/70 | HR 72 | Temp 98.4°F | Resp 18 | Ht 68.0 in | Wt 146.6 lb

## 2011-08-08 DIAGNOSIS — I87039 Postthrombotic syndrome with ulcer and inflammation of unspecified lower extremity: Secondary | ICD-10-CM

## 2011-08-08 NOTE — Progress Notes (Signed)
[facility]  [name], MD,  FACS [address] [place]  [address] [place], [place]    [address] Phone:  [phone] FAX:  [phone]   Re:   Roy Blackwell DOB:   1929-03-12 MRN:   [id number]  ASSESSMENT AND PLAN: 1.  Bilateral chronic venous stasis ulcers, medially above his ankles.  We are changing leg Unna boot and dressings every 2 - 3 weeks.  He grew out Pseudomonas on cultures in May 2012 and has been on Septra since then.  According to Dr. Zachery Dakins, every time he stopped the antibiotic, the wounds got worse.  So I will continue the Septra for now.  Note, it appears that Zachery Dakins was also giving Roy Blackwell.  I will stop that today and we'll see how he does.  The wounds are stable, and have not really changed much since I have seen him.  We will photo about every 2 to 3 months as long as they are stable.  2.  On coumadin.  Followed through the [place] coumadin clinic.  Last INR 2.0.  (06/14/2011)  3.  Hypertrophic cardiomyopathy.  Last seen by Dr. Karie Schwalbe. [name] 08/15/2010.  To see him again in April 2013.  HISTORY OF PRESENT ILLNESS: Chief Complaint  Patient presents with  . Wound Check    reck Bil leg wound    Roy Blackwell is a 78 y.o. (DOB: 1928-12-17)  AA male who is a patient of Arvilla Meres, MD, MD and comes to me today for bilateral chronic venous stasis ulcers.  Though it list Dr. Debby Bud as his PCP, I cannot find any notes by Dr. Debby Bud in the chart.  Because Roy Blackwell sees several doctors, he does not see Dr. Debby Bud (except when a problem arises).  Roy Blackwell was managed by the wound care center, then by Dr. Zachery Dakins.  With Dr. Annette Stable retirement, he asked if I would look after this patient.  He has had success with Una boots and compression to improve these ulcer.  This is a long term process, but certainly compared to photos from Aug 2012, there has been improvement in ulcers on both lower extremities.  The patient has not new problem  or concern.  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  PHYSICAL EXAM: BP 124/70  Pulse 72  Temp(Src) 98.4 F (36.9 C) (Temporal)  Resp 18  Ht 5\' 8"  (1.727 m)  Wt 146 lb 9.6 oz (66.497 kg)  BMI 22.29 kg/m2  Lower extremities:  1.8 x  6.5 cm ulcer medial left leg (unchanged) and  1.8 x 3.0 cm ulcer medial right leg.    Both wounds are clean.  We will plan to photograph these every 2 months.  DATA REVIEWED: Old photos and the chart.  [name], MD, FACS Office:  [phone]

## 2011-08-18 ENCOUNTER — Encounter: Payer: Self-pay | Admitting: Cardiology

## 2011-08-18 ENCOUNTER — Ambulatory Visit (INDEPENDENT_AMBULATORY_CARE_PROVIDER_SITE_OTHER): Payer: Medicare Other | Admitting: Cardiology

## 2011-08-18 VITALS — BP 110/64 | HR 68 | Ht 68.0 in | Wt 143.8 lb

## 2011-08-18 DIAGNOSIS — I872 Venous insufficiency (chronic) (peripheral): Secondary | ICD-10-CM

## 2011-08-18 DIAGNOSIS — I87039 Postthrombotic syndrome with ulcer and inflammation of unspecified lower extremity: Secondary | ICD-10-CM

## 2011-08-18 DIAGNOSIS — I421 Obstructive hypertrophic cardiomyopathy: Secondary | ICD-10-CM

## 2011-08-18 DIAGNOSIS — I82409 Acute embolism and thrombosis of unspecified deep veins of unspecified lower extremity: Secondary | ICD-10-CM

## 2011-08-18 DIAGNOSIS — R634 Abnormal weight loss: Secondary | ICD-10-CM

## 2011-08-18 NOTE — Progress Notes (Signed)
   HPI:  Patient is doing ok.  He now has a unaboot for problems with his leg ulcers.  Seeing Dr. Ezzard Standing.  He tells me he has been losing weight.  His TSH was checked one year ago and was normal.  He still smokes, now since the age of 76.  He does not see primary care.  No cardiac symptoms.  He did have left lower quadrant pain, then to the right, then up to r arm, then to left arm.  Went away and no recurrence.  Saw Dr. Odelia Gage issue with leg on chronic antibiotic treatment.    Current Outpatient Prescriptions  Medication Sig Dispense Refill  . acetaminophen (TYLENOL) 500 MG tablet Take 500 mg by mouth as needed.      . sulfamethoxazole-trimethoprim (BACTRIM DS,SEPTRA DS) 800-160 MG per tablet Take 1 tablet by mouth 2 (two) times daily.        Marland Kitchen warfarin (COUMADIN) 5 MG tablet Take As Directed per [facility]   60 tablet  3  . DISCONTD: furosemide (LASIX) 20 MG tablet Take 20 mg by mouth daily.          No Known Allergies  Past Medical History  Diagnosis Date  . Venous insufficiency     His diagnosis this is a note on Roy Blackwell he was diagnosed as having chronic thrombophlebitis when he was just in back in the 50s and has seen numerous surgeons Dr. Nedra Hai Dr. 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 Dr. Genelle Bal he continued his Coumadin management but he has no history of atri  . Hypertrophic cardiomyopathy     No past surgical history on file.  No family history on file.  History   Social History  . Marital Status: Married    Spouse Name: N/A    Number of Children: N/A  . Years of Education: N/A   Occupational History  . retired from [employer]    Social History Main Topics  . Smoking status: Current Everyday Smoker -- 0.5 packs/day  . Smokeless tobacco: Never Used  . Alcohol Use: No  . Drug Use: No  . Sexually Active: Not on file   Other Topics Concern   . Not on file   Social History Narrative  . No narrative on file    ROS: Please see the HPI.  All other systems reviewed and negative.  PHYSICAL EXAM:  BP 110/64  Pulse 68  Ht 5\' 8"  (1.727 m)  Wt 143 lb 12.8 oz (65.227 kg)  BMI 21.86 kg/m2  General: Well developed, well nourished, in no acute distress. Head:  Normocephalic and atraumatic. Neck: no JVD Lungs: Clear to auscultation and percussion. Heart: Normal S1 and S2.  No sig SEM.  No DM.  No rub.   Abdomen:  Normal bowel sounds; soft; non tender; no organomegaly Pulses: Pulses normal in all 4 extremities. Extremities: No clubbing or cyanosis. No edema. Neurologic: Alert and oriented x 3.  EKG:   NSR.  LVH with repole changes.   No change from prior tracing.   ASSESSMENT AND PLAN:

## 2011-08-18 NOTE — Patient Instructions (Signed)
Your physician wants you to follow-up in: 9 MONTHS with Dr [name].  You will receive a reminder letter in the mail two months in advance. If you don't receive a letter, please call our office to schedule the follow-up appointment.  A chest x-ray takes a picture of the organs and structures inside the chest, including the heart, lungs, and blood vessels. This test can show several things, including, whether the heart is enlarges; whether fluid is building up in the lungs; and whether pacemaker / defibrillator leads are still in place. [facility])  Your physician recommends that you have lab work today: LIVER, BMP, CBC, TSH, Free T4  You have been referred to Primary Care at [facility].

## 2011-08-18 NOTE — Assessment & Plan Note (Signed)
Has been on chronic warfarin treatment.

## 2011-08-18 NOTE — Assessment & Plan Note (Signed)
See  Roy Blackwell Standing note.

## 2011-08-18 NOTE — Assessment & Plan Note (Signed)
Stable  No change

## 2011-08-18 NOTE — Assessment & Plan Note (Signed)
Says his appetite is good.  However down overall about 16 pounds.  Will check basic labs.  He does not want a GI eval.  Get CXR since smoker.  Encouraged him to get primary MD.

## 2011-08-24 ENCOUNTER — Ambulatory Visit (INDEPENDENT_AMBULATORY_CARE_PROVIDER_SITE_OTHER): Payer: Medicare Other | Admitting: Surgery

## 2011-08-24 VITALS — BP 120/70 | HR 78 | Temp 96.9°F | Resp 24 | Ht 68.0 in | Wt 143.0 lb

## 2011-08-24 DIAGNOSIS — I87039 Postthrombotic syndrome with ulcer and inflammation of unspecified lower extremity: Secondary | ICD-10-CM

## 2011-08-24 NOTE — Progress Notes (Addendum)
[facility]  Roy Kin, MD,  FACS [address] [place]  [address] [place], [place]    [address] Phone:  [phone] FAX:  [phone]   Re:   Roy Blackwell DOB:   12-Jan-1929 MRN:   [id number]  ASSESSMENT AND PLAN: 1.  Bilateral chronic venous stasis ulcers, medially above his ankles.  We are changing leg Unna boot and dressings every 2 - 3 weeks.  He grew out Pseudomonas on cultures in May 2012 and has been on Septra since then.  According to Dr. Zachery Dakins, every time he stopped the antibiotic, the wounds got worse.  So I will continue the Septra for now. He was on chronic Lasix which I stopped a month ago.  He is doing okay with that.  The wounds are stable, but have not really changed much since I have seen him.  We will photo about every 2 to 3 months as long as they are stable.  I'll see him back in 2 weeks for unna boot change.  2.  On coumadin.  Followed through the [facility] coumadin clinic.  Last INR 2.0.  (06/14/2011)  3.  Hypertrophic cardiomyopathy.  Last seen by Dr. Karie Blackwell. Roy Blackwell 08/15/2010.  He recently saw Dr. Riley Blackwell.  There were apparently no cardiac issues.  He did order a CXR that will be done in about 10 days.  4.  Complains today of vague abdominal pain and weight loss of about 10 pounds over the last year.  I can find nothing on PE and encouraged him to see his PCP (Dr. Debby Bud?). [Note:  He has an appointment with Dr. Excell Seltzer on 08/31/2011.  He was unaware of this.  I stressed that he keep that appointment.]  HISTORY OF PRESENT ILLNESS: Chief Complaint  Patient presents with  . Wound Check    Roy Blackwell is a 78 y.o. (DOB: Jul 30, 1928)  AA male who is a patient of Arvilla Meres, MD, MD and comes to me today for follow up of bilateral chronic venous stasis ulcers.  Though it list Dr. Debby Bud as his PCP, I cannot find any notes by Dr. Debby Bud in the chart.  [Note:  He has an appointment with Dr. Excell Seltzer on 08/31/2011.]   [name] was managed by the  wound care center, then by Dr. Zachery Dakins. With Dr. Annette Stable retirement, he asked if I would look after this patient.  He has had success with Una boots and compression to improve these ulcer.  This is a long term process, but certainly compared to photos from Aug 2012, there has been improvement in ulcers on both lower extremities.   His current complaint is of some vague abdominal pain and weight loss of about 10 pounds over the last year.  He weighed 154 on 12/09/2011.  He has no nausea, no vomiting, no change in bowel habits, no fever, and no other specific complaint.  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  PHYSICAL EXAM: BP 120/70  Pulse 78  Temp(Src) 96.9 F (36.1 C) (Temporal)  Resp 24  Ht 5\' 8"  (1.727 m)  Wt 143 lb (64.864 kg)  BMI 21.74 kg/m2  Lungs:  Clear Heart:  RRR, no murmur. Abdomen:  No mass.  No localized tenderness. No hernia.  No scars. Lower extremities:  2.0 x  6.8 cm ulcer medial left leg (unchanged) and  1.6 x 3.8 cm ulcer medial right leg.    Both wounds are clean.  We will plan to  photograph these every 2 months.  DATA REVIEWED: Old photos and the chart.  Roy Kin, MD, FACS Office:  [phone]

## 2011-08-27 ENCOUNTER — Encounter: Payer: Self-pay | Admitting: Internal Medicine

## 2011-08-27 DIAGNOSIS — C61 Malignant neoplasm of prostate: Secondary | ICD-10-CM

## 2011-08-27 DIAGNOSIS — R911 Solitary pulmonary nodule: Secondary | ICD-10-CM

## 2011-08-27 DIAGNOSIS — I10 Essential (primary) hypertension: Secondary | ICD-10-CM

## 2011-08-27 DIAGNOSIS — Z0000 Encounter for general adult medical examination without abnormal findings: Secondary | ICD-10-CM | POA: Insufficient documentation

## 2011-08-27 DIAGNOSIS — E785 Hyperlipidemia, unspecified: Secondary | ICD-10-CM

## 2011-08-27 HISTORY — DX: Solitary pulmonary nodule: R91.1

## 2011-08-27 HISTORY — DX: Hyperlipidemia, unspecified: E78.5

## 2011-08-27 HISTORY — DX: Essential (primary) hypertension: I10

## 2011-08-27 HISTORY — DX: Malignant neoplasm of prostate: C61

## 2011-08-31 ENCOUNTER — Encounter: Payer: Self-pay | Admitting: Internal Medicine

## 2011-08-31 ENCOUNTER — Other Ambulatory Visit (INDEPENDENT_AMBULATORY_CARE_PROVIDER_SITE_OTHER): Payer: Medicare Other

## 2011-08-31 ENCOUNTER — Ambulatory Visit (INDEPENDENT_AMBULATORY_CARE_PROVIDER_SITE_OTHER): Payer: Medicare Other | Admitting: Internal Medicine

## 2011-08-31 VITALS — BP 120/62 | HR 77 | Temp 97.9°F | Ht 68.0 in | Wt 141.0 lb

## 2011-08-31 DIAGNOSIS — R590 Localized enlarged lymph nodes: Secondary | ICD-10-CM

## 2011-08-31 DIAGNOSIS — Z0000 Encounter for general adult medical examination without abnormal findings: Secondary | ICD-10-CM

## 2011-08-31 DIAGNOSIS — C61 Malignant neoplasm of prostate: Secondary | ICD-10-CM

## 2011-08-31 DIAGNOSIS — R1084 Generalized abdominal pain: Secondary | ICD-10-CM

## 2011-08-31 DIAGNOSIS — I10 Essential (primary) hypertension: Secondary | ICD-10-CM

## 2011-08-31 DIAGNOSIS — Z7901 Long term (current) use of anticoagulants: Secondary | ICD-10-CM

## 2011-08-31 HISTORY — DX: Localized enlarged lymph nodes: R59.0

## 2011-08-31 HISTORY — DX: Long term (current) use of anticoagulants: Z79.01

## 2011-08-31 LAB — CBC WITH DIFFERENTIAL/PLATELET
Eosinophils Relative: 1.7 % (ref 0.0–5.0)
MCV: 94.7 fl (ref 78.0–100.0)
Monocytes Absolute: 1 10*3/uL (ref 0.1–1.0)
Neutrophils Relative %: 73.9 % (ref 43.0–77.0)
Platelets: 572 10*3/uL — ABNORMAL HIGH (ref 150.0–400.0)
WBC: 10 10*3/uL (ref 4.5–10.5)

## 2011-08-31 LAB — URINALYSIS, ROUTINE W REFLEX MICROSCOPIC
Ketones, ur: NEGATIVE
Leukocytes, UA: NEGATIVE
Specific Gravity, Urine: 1.025 (ref 1.000–1.030)
Urine Glucose: NEGATIVE
pH: 6 (ref 5.0–8.0)

## 2011-08-31 LAB — HEPATIC FUNCTION PANEL
ALT: 9 U/L (ref 0–53)
Bilirubin, Direct: 0.1 mg/dL (ref 0.0–0.3)
Total Bilirubin: 0.5 mg/dL (ref 0.3–1.2)

## 2011-08-31 LAB — BASIC METABOLIC PANEL
BUN: 26 mg/dL — ABNORMAL HIGH (ref 6–23)
CO2: 23 mEq/L (ref 19–32)
Chloride: 102 mEq/L (ref 96–112)
Glucose, Bld: 85 mg/dL (ref 70–99)
Potassium: 5.1 mEq/L (ref 3.5–5.1)

## 2011-08-31 MED ORDER — PNEUMOCOCCAL VAC POLYVALENT 25 MCG/0.5ML IJ INJ: 0.5000 mL | INJECTION | Freq: Once | INTRAMUSCULAR | Status: AC

## 2011-08-31 NOTE — Assessment & Plan Note (Signed)
Details unclear, has seen [facility] remotely it seems, will ask for information, and check PSA, ua

## 2011-08-31 NOTE — Patient Instructions (Signed)
You had the pneumonia shot today Continue all other medications as before Please have the pharmacy call with any refills you may need. Please keep your appointments with your specialists as you have planned - Dr [name] and your regular Coumadin checks We will call for information about the prostate cancer as well as Dr Allene Pyo information about you Please go to XRAY in the Basement for the x-ray test Please go to LAB in the Basement for the blood and/or urine tests to be done today You will be contacted regarding the referral for: CT scan for the abdomen/pelvis  - Please see PCC's before leaving today to have scheduled

## 2011-08-31 NOTE — Assessment & Plan Note (Signed)
For routine imaging today since some pain seems to go up to the chest, but I wonder with recent wt loss, worsening symtpoms as per HPI and hx of inguinal LA if he has a malignancy;  For bun/cr and CT abd/pelvis; will ask for information from Dr Ezzard Standing as well

## 2011-08-31 NOTE — Progress Notes (Signed)
Subjective:    Patient ID: Roy Blackwell, male    DOB: 1928-08-06, 78 y.o.   MRN: [id number]  HPI  Here to establish as new pt;  Here for wellness;  Overall doing ok;  Pt denies CP, worsening SOB, DOE, wheezing, orthopnea, PND, worsening LE edema, palpitations, dizziness or syncope.  Pt denies neurological change such as new Headache, facial or extremity weakness.  Pt denies polydipsia, polyuria, or low sugar symptoms. Pt states overall good compliance with treatment and medications, good tolerability, and trying to follow lower cholesterol diet.  Pt denies worsening depressive symptoms, suicidal ideation or panic. No fever,  night sweats, loss of appetite, or other constitutional symptoms, excpt has los wt from 157 to 141 over he estimates 6 mo.  Pt states good ability with ADL's, low fall risk, home safety reviewed and adequate, no significant changes in hearing or vision, and occasionally active with exercise. Due for pneumovax;  C/o pain to the abd x 2 mo, mild, started at the left abd, seemed to now involve the right abd with some discomfort radiating to the anterior chest as well.  Pain makes it more difficult to stand up from sitting, tends to rock back and forth while seated a few time before pushing off with arms to stand.  Legs seem weaker than before and makes a few short steps before longer usual steps.  No n/v, radiation to the back, or blood, though does have occas constipation.  No prior hx of this,  No prior egd or colonoscopy, and does not recall any CT scans or similar.  Nothing else seems to make the pain better or worse, including eating, but sleeping on left side seems to make the abd pain worse.   Retired 2004, prior worked at [employer].  Has hx of bulky inguinal bilat LA on CT 2009, has also seen Dr Ezzard Standing gen surgury in the past, details unclear Past Medical History  Diagnosis Date  . Venous insufficiency     His diagnosis this is a note on [name] he was diagnosed as  having chronic thrombophlebitis when he was just in back in the 50s and has seen numerous surgeons Dr. Nedra Hai Dr. 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 Dr. Genelle Bal he continued his Coumadin management but he has no history of atri  . Hypertrophic cardiomyopathy   . Prostate cancer 08/27/2011  . Hypertension 08/27/2011  . Pulmonary nodule 08/27/2011    2008  . Hyperlipidemia 08/27/2011   History reviewed. No pertinent past surgical history.  reports that he has been smoking.  He has never used smokeless tobacco. He reports that he does not drink alcohol or use illicit drugs. family history is not on file. - pt denies any knowledge of FH  No Known Allergies Current Outpatient Prescriptions on File Prior to Visit  Medication Sig Dispense Refill  . acetaminophen (TYLENOL) 500 MG tablet Take 500 mg by mouth as needed.      . sulfamethoxazole-trimethoprim (BACTRIM DS,SEPTRA DS) 800-160 MG per tablet Take 1 tablet by mouth 2 (two) times daily.        Marland Kitchen warfarin (COUMADIN) 5 MG tablet Take As Directed per Coumadin Clinic   60 tablet  3  . DISCONTD: furosemide (LASIX) 20 MG tablet Take 20 mg by mouth daily.         Review of Systems Review of Systems  Constitutional: Negative for diaphoresis, activity change, appetite change and unexpected weight change.  HENT: Negative for hearing loss, ear pain, facial swelling, mouth sores and neck stiffness.   Eyes: Negative for pain, redness and visual disturbance.  Respiratory: Negative for shortness of breath and wheezing.   Cardiovascular: Negative for chest pain and palpitations.  Gastrointestinal: Negative for diarrhea, blood in stool, abdominal distention and rectal pain.  Genitourinary: Negative for hematuria, flank pain and decreased urine volume.  Musculoskeletal: Negative for myalgias and joint swelling.  Skin: Negative for color change and wound.    Neurological: Negative for syncope and numbness.  Hematological: Negative for adenopathy.  Psychiatric/Behavioral: Negative for hallucinations, self-injury, decreased concentration and agitation.      Objective:   Physical Exam BP 120/62  Pulse 77  Temp(Src) 97.9 F (36.6 C) (Oral)  Ht 5\' 8"  (1.727 m)  Wt 141 lb (63.957 kg)  BMI 21.44 kg/m2  SpO2 94% Physical Exam  VS noted Constitutional: Pt is oriented to person, place, and time. Appears well-developed and well-nourished.  HENT:  Head: Normocephalic and atraumatic.  Right Ear: External ear normal.  Left Ear: External ear normal.  Nose: Nose normal.  Mouth/Throat: Oropharynx is clear and moist.  Eyes: Conjunctivae and EOM are normal. Pupils are equal, round, and reactive to light.  Neck: Normal range of motion. Neck supple. No JVD present. No tracheal deviation present.  Cardiovascular: Normal rate, regular rhythm, normal heart sounds and intact distal pulses.   Pulmonary/Chest: Effort normal and breath sounds normal.  Abdominal: Soft. Bowel sounds are normal. There is no tenderness.  Musculoskeletal: Normal range of motion. Exhibits no edema.  Lymphadenopathy:  Has no cervical adenopathy.  Neurological: Pt is alert and oriented to person, place, and time. Pt has normal reflexes. No cranial nerve deficit.  Skin: Skin is warm and dry. No rash noted.  Psychiatric:  Has  normal mood and affect. Behavior is normal.     Assessment & Plan:

## 2011-08-31 NOTE — Assessment & Plan Note (Signed)
Overall doing well, age appropriate education and counseling updated, referrals for preventative services and immunizations addressed, dietary and smoking counseling addressed, most recent labs and ECG reviewed.  I have personally reviewed and have noted: 1) the patient's medical and social history 2) The pt's use of alcohol, tobacco, and illicit drugs 3) The patient's current medications and supplements 4) Functional ability including ADL's, fall risk, home safety risk, hearing and visual impairment 5) Diet and physical activities 6) Evidence for depression or mood disorder 7) The patient's height, weight, and BMI have been recorded in the chart I have made referrals, and provided counseling and education based on review of the above For pneumovax today as he is due

## 2011-09-01 ENCOUNTER — Encounter: Payer: Self-pay | Admitting: Internal Medicine

## 2011-09-01 ENCOUNTER — Other Ambulatory Visit: Payer: Self-pay | Admitting: Internal Medicine

## 2011-09-01 ENCOUNTER — Ambulatory Visit (INDEPENDENT_AMBULATORY_CARE_PROVIDER_SITE_OTHER)
Admission: RE | Admit: 2011-09-01 | Discharge: 2011-09-01 | Disposition: A | Discharge status: Home / Self Care | Payer: Medicare Other | Source: Ambulatory Visit | Attending: Internal Medicine | Admitting: Internal Medicine

## 2011-09-01 DIAGNOSIS — N189 Chronic kidney disease, unspecified: Secondary | ICD-10-CM | POA: Insufficient documentation

## 2011-09-01 DIAGNOSIS — N1330 Unspecified hydronephrosis: Secondary | ICD-10-CM

## 2011-09-01 DIAGNOSIS — C61 Malignant neoplasm of prostate: Secondary | ICD-10-CM

## 2011-09-01 DIAGNOSIS — R590 Localized enlarged lymph nodes: Secondary | ICD-10-CM

## 2011-09-01 DIAGNOSIS — R1084 Generalized abdominal pain: Secondary | ICD-10-CM

## 2011-09-01 HISTORY — DX: Chronic kidney disease, unspecified: N18.9

## 2011-09-01 HISTORY — DX: Unspecified hydronephrosis: N13.30

## 2011-09-01 HISTORY — DX: Localized enlarged lymph nodes: R59.0

## 2011-09-01 MED ORDER — IOHEXOL 300 MG/ML  SOLN
80.0000 mL | Freq: Once | INTRAMUSCULAR | Status: AC | PRN
  Administered 2011-09-01: 80 mL via INTRAVENOUS

## 2011-09-03 ENCOUNTER — Encounter: Payer: Self-pay | Admitting: Internal Medicine

## 2011-09-03 NOTE — Assessment & Plan Note (Signed)
stable overall by hx and exam, most recent data reviewed with pt, and pt to continue medical treatment as before BP Readings from Last 3 Encounters:  08/31/11 120/62  08/24/11 120/70  08/18/11 110/64

## 2011-09-04 ENCOUNTER — Telehealth: Payer: Self-pay | Admitting: Oncology

## 2011-09-04 NOTE — Telephone Encounter (Signed)
called pts home to scheduled appt fort this week and pts asked that we schedule for the week of 05/13.  scheduled appt for 05/14.  will fax over a lettter to Dr. [name] office with appt d/t

## 2011-09-05 ENCOUNTER — Telehealth: Payer: Self-pay | Admitting: Oncology

## 2011-09-05 NOTE — Telephone Encounter (Signed)
Referred by Dr. [name] Dx- Prostate Ca

## 2011-09-06 ENCOUNTER — Ambulatory Visit (INDEPENDENT_AMBULATORY_CARE_PROVIDER_SITE_OTHER): Payer: Medicare Other | Admitting: *Deleted

## 2011-09-06 ENCOUNTER — Ambulatory Visit (INDEPENDENT_AMBULATORY_CARE_PROVIDER_SITE_OTHER): Payer: Medicare Other | Admitting: Surgery

## 2011-09-06 ENCOUNTER — Encounter (INDEPENDENT_AMBULATORY_CARE_PROVIDER_SITE_OTHER): Payer: Self-pay | Admitting: Surgery

## 2011-09-06 VITALS — BP 128/70 | HR 76 | Temp 99.3°F | Resp 16 | Ht 68.0 in | Wt 134.5 lb

## 2011-09-06 DIAGNOSIS — I82409 Acute embolism and thrombosis of unspecified deep veins of unspecified lower extremity: Secondary | ICD-10-CM

## 2011-09-06 DIAGNOSIS — I87039 Postthrombotic syndrome with ulcer and inflammation of unspecified lower extremity: Secondary | ICD-10-CM

## 2011-09-06 LAB — PROTIME-INR: INR: 14.1 ratio (ref 0.8–1.0)

## 2011-09-06 MED ORDER — PHYTONADIONE 5 MG PO TABS: ORAL_TABLET | ORAL | Status: DC

## 2011-09-06 NOTE — Progress Notes (Signed)
[facility]  Roy Kin, MD,  FACS [address] [place]  [address] [place], [place]    [address] Phone:  [phone] FAX:  [phone]   Re:   Roy Blackwell DOB:   March 12, 1929 MRN:   [id number]  ASSESSMENT AND PLAN: 1.  Bilateral chronic venous stasis ulcers, medially above his ankles.  We are changing leg Unna boot and dressings every 2 - 3 weeks.  He grew out Pseudomonas on cultures in May 2012 and has been on Septra since then.  According to Dr. Zachery Blackwell, every time he stopped the antibiotic, the wounds got worse.  So I will continue the Septra for now. He was on chronic Lasix which I stopped a month ago.  He is doing okay with that.  The wounds are Blackwell, but have not really changed much since I have seen him.  We will photo about every 2 to 3 months as long as they are Blackwell.  I'll see him back in 2 weeks for unna boot change.  His legs are Blackwell since I first saw him in January.  2.  On coumadin.  Followed through the [facility] [facility] coumadin clinic.  Last INR 8.0.  (09/06/2011).  He is aware of this and is holding his coumadin.  3.  Hypertrophic cardiomyopathy.  Last seen by Dr. Karie Blackwell. Roy Blackwell 08/15/2010.  He recently saw Dr. Riley Blackwell.  There were apparently no cardiac issues.  He did order a CXR that will be done in about 10 days.  4.  Weight loss (15 to 20 pounds in 9 months) and vague abdominal pain.  CT scan 08/31/2011 shows bulky intrathoracic and intraabdominal adenopathy.  He also has a pathologic fx of L4.  To see Dr. Clelia Blackwell 09/12/2011.  5.  He says he has a history of prostate cancer.  PSA 1460.  HISTORY OF PRESENT ILLNESS: Chief Complaint  Patient presents with  . Routine Post Op    f/u unna boots on Bil leg wounds    Roy Blackwell is a 78 y.o. (DOB: 09/23/1928)  AA male who is a patient of Roy Barre, MD, MD and comes to me today for follow up of bilateral chronic venous stasis ulcers.  He saw Dr. Jonny Blackwell who got a CT scan that shows  bulky intrathoracic and  intraabdominal adenopathy.  He also has a pathologic fx of L4.   Roy Blackwell was managed by the wound care center, then by Dr. Zachery Blackwell. With Dr. Annette Blackwell retirement, he asked if I would look after this patient.  He has had success with Una boots and compression to improve these ulcer.  This is a long term process, but certainly compared to photos from Aug 2012, there has been some improvement in ulcers on both lower extremities.  PHYSICAL EXAM: BP 128/70  Pulse 76  Temp(Src) 99.3 F (37.4 C) (Temporal)  Resp 16  Ht 5\' 8"  (1.727 m)  Wt 134 lb 8 oz (61.009 kg)  BMI 20.45 kg/m2  Lungs:  Clear Heart:  RRR, no murmur. Abdomen:  No mass.  No localized tenderness. No hernia.  No scars. He has some shotty inguinal nodes, R>L. Lower extremities:  2.0 x  6.0 cm ulcer medial left leg (unchanged) and  1.4 x 3.2 cm ulcer medial right leg.    Both wounds are clean.  We will plan to photograph these every 2 months.  DATA REVIEWED: Recent CT scan.  Roy Kin, MD, FACS Office:  [phone]

## 2011-09-06 NOTE — Patient Instructions (Addendum)
A full discussion of the nature of anticoagulants has been carried out.  The need for frequent and regular monitoring, precise dosage adjustment and compliance is stressed.  Side effects of potential bleeding are discussed.   Avoid alcohol consumption.  Call if any signs of abnormal    Instructed wife after receiving the INR over 14 of mephyton tablet instructions, take 1/2 of tablet, hold coumadin, keep other 1/2 of tablet in case you need in future.

## 2011-09-07 ENCOUNTER — Ambulatory Visit (INDEPENDENT_AMBULATORY_CARE_PROVIDER_SITE_OTHER): Payer: Medicare Other | Admitting: Internal Medicine

## 2011-09-07 ENCOUNTER — Encounter: Payer: Self-pay | Admitting: Internal Medicine

## 2011-09-07 VITALS — BP 116/60 | HR 94 | Temp 98.6°F | Wt 138.0 lb

## 2011-09-07 DIAGNOSIS — C7951 Secondary malignant neoplasm of bone: Secondary | ICD-10-CM | POA: Insufficient documentation

## 2011-09-07 DIAGNOSIS — C801 Malignant (primary) neoplasm, unspecified: Secondary | ICD-10-CM

## 2011-09-07 DIAGNOSIS — R590 Localized enlarged lymph nodes: Secondary | ICD-10-CM

## 2011-09-07 DIAGNOSIS — R21 Rash and other nonspecific skin eruption: Secondary | ICD-10-CM

## 2011-09-07 DIAGNOSIS — C7952 Secondary malignant neoplasm of bone marrow: Secondary | ICD-10-CM

## 2011-09-07 DIAGNOSIS — C61 Malignant neoplasm of prostate: Secondary | ICD-10-CM

## 2011-09-07 DIAGNOSIS — R599 Enlarged lymph nodes, unspecified: Secondary | ICD-10-CM

## 2011-09-07 MED ORDER — CLOTRIMAZOLE-BETAMETHASONE 1-0.05 % EX CREA: TOPICAL_CREAM | CUTANEOUS | Status: DC

## 2011-09-07 MED ORDER — TRAMADOL HCL 50 MG PO TABS: 50.0000 mg | ORAL_TABLET | Freq: Four times a day (QID) | ORAL | Status: AC | PRN

## 2011-09-07 NOTE — Patient Instructions (Signed)
Take all new medications as prescribed - the pain medication only as needed, and the cream Please call if the pain medication causes dizziness, as it may have to be changed You may want to take colace 100 mg twice per day as needed for constipation as well Continue all other medications as before Please have the pharmacy call with any refills you may need. Please keep your appointments with your specialists as you have planned  - oncology may 14 Please see the [facility] to see if the Urology appt has been made (now) You are given the handicap form today

## 2011-09-08 ENCOUNTER — Ambulatory Visit (INDEPENDENT_AMBULATORY_CARE_PROVIDER_SITE_OTHER): Payer: Medicare Other | Admitting: *Deleted

## 2011-09-08 DIAGNOSIS — I82409 Acute embolism and thrombosis of unspecified deep veins of unspecified lower extremity: Secondary | ICD-10-CM

## 2011-09-08 LAB — POCT INR: INR: 2

## 2011-09-10 ENCOUNTER — Encounter: Payer: Self-pay | Admitting: Internal Medicine

## 2011-09-10 NOTE — Assessment & Plan Note (Signed)
?   Prostate ca related, ? Amenable to XRT?, I expect will be addressed per oncology

## 2011-09-10 NOTE — Assessment & Plan Note (Signed)
With very PSA, much more advanced than when last seen per urology, for urology and oncology f/u

## 2011-09-10 NOTE — Assessment & Plan Note (Signed)
?   Prostate ca related vs other secondary process;  Likely to account for abd discomfort but does not require narcotic at this time,  to f/u any worsening symptoms or concerns

## 2011-09-10 NOTE — Progress Notes (Signed)
Subjective:    Patient ID: Roy Blackwell, male    DOB: January 29, 1929, 78 y.o.   MRN: [id number]  HPI  Here to f/u with mult family - wife, and 2 children;  Overall doing ok, does have mild itchy new rash for several days in the past wk to medial legs, no pain, swelling, red streaks.  No change in abd discomfort, swelling, and denies vomiting, flank pain, and  Denies urinary symptoms such as dysuria, frequency, urgency,or hematuria.   Pt denies fever, wt loss, night sweats, loss of appetite, or other constitutional symptoms  Denies worsening depressive symptoms, suicidal ideation, or panic.  Pt denies chest pain, increased sob or doe, wheezing, orthopnea, PND, increased LE swelling, palpitations, dizziness or syncope.  Pt denies new neurological symptoms such as new headache, or facial or extremity weakness or numbness   Pt denies polydipsia, polyuria.  No overt bleeding or bruising.    Past Medical History  Diagnosis Date  . Venous insufficiency     His diagnosis this is a note on Roy Blackwell he was diagnosed as having chronic thrombophlebitis when he was just in back in the 50s and has seen numerous surgeons Dr. Nedra Hai Dr. 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 Dr. Genelle Bal he continued his Coumadin management but he has no history of atri  . Hypertrophic cardiomyopathy   . Prostate cancer 08/27/2011  . Hypertension 08/27/2011  . Pulmonary nodule 08/27/2011    2008  . Hyperlipidemia 08/27/2011  . Inguinal lymphadenopathy 08/31/2011    Bulky bilat 2009 CT  . Warfarin anticoagulation 08/31/2011  . Hydronephrosis 09/01/2011  . CKD (chronic kidney disease) 09/01/2011  . Abdominal lymphadenopathy 09/01/2011   No past surgical history on file.  reports that he has been smoking.  He has never used smokeless tobacco. He reports that he does not drink alcohol or use illicit drugs. family history is not on file. -  neg per pt - unable to give specifics No Known Allergies Current Outpatient Prescriptions on File Prior to Visit  Medication Sig Dispense Refill  . acetaminophen (TYLENOL) 500 MG tablet Take 500 mg by mouth as needed.      . phytonadione (MEPHYTON) 5 MG tablet Take 1/2 tablet or 2.5 mg tablet, keep other half of tablet in case you need in future  1 tablet  1  . sulfamethoxazole-trimethoprim (BACTRIM DS,SEPTRA DS) 800-160 MG per tablet Take 1 tablet by mouth 2 (two) times daily.       Marland Kitchen warfarin (COUMADIN) 5 MG tablet Take As Directed per Coumadin Clinic   60 tablet  3  . DISCONTD: furosemide (LASIX) 20 MG tablet Take 20 mg by mouth daily.         Current Facility-Administered Medications on File Prior to Visit  Medication Dose Route Frequency Provider Last Rate Last Dose  . pneumococcal 23 valent vaccine (PNU-IMMUNE) injection 0.5 mL  0.5 mL Intramuscular Once Corwin Levins, MD       Review of Systems Constitutional: Negative for diaphoresis and unexpected weight change.  Eyes: Negative for photophobia and visual disturbance.  Respiratory: Negative for choking and stridor.   Gastrointestinal: Negative for vomiting and blood in stool.  Genitourinary: Negative for hematuria and decreased urine volume.  Musculoskeletal: Negative for gait problem.  Neurological: Negative for tremors and numbness.  Psychiatric/Behavioral: Negative for decreased concentration. The patient is not hyperactive.  Objective:   Physical Exam BP 116/60  Pulse 94  Temp(Src) 98.6 F (37 C) (Oral)  Wt 138 lb (62.596 kg)  SpO2 92% Physical Exam  VS noted, non toxic Constitutional: Pt appears well-developed and well-nourished.  HENT: Head: Normocephalic.  Right Ear: External ear normal.  Left Ear: External ear normal.  Eyes: Conjunctivae and EOM are normal. Pupils are equal, round, and reactive to light.  Neck: Normal range of motion. Neck supple.  Cardiovascular: Normal rate and regular rhythm.     Pulmonary/Chest: Effort normal and breath sounds normal.  Abd:  Soft , + BS, nondistended Neurological: Pt is alert. Not confused  Skin: Skin is warm. No erythema. Minor 2 cm erythema to bilat medial thigh, nontender without swelling or red streaks Psychiatric: Pt behavior is normal. Thought content normal.     Assessment & Plan:

## 2011-09-10 NOTE — Assessment & Plan Note (Signed)
Minor irritative, ? Contact related - for lotrisone cr prn

## 2011-09-11 ENCOUNTER — Other Ambulatory Visit: Payer: Self-pay | Admitting: Oncology

## 2011-09-11 DIAGNOSIS — C61 Malignant neoplasm of prostate: Secondary | ICD-10-CM

## 2011-09-12 ENCOUNTER — Telehealth: Payer: Self-pay | Admitting: Oncology

## 2011-09-12 ENCOUNTER — Ambulatory Visit: Payer: Medicare Other

## 2011-09-12 ENCOUNTER — Other Ambulatory Visit (HOSPITAL_BASED_OUTPATIENT_CLINIC_OR_DEPARTMENT_OTHER): Payer: Medicare Other | Admitting: Lab

## 2011-09-12 ENCOUNTER — Encounter: Payer: Self-pay | Admitting: Oncology

## 2011-09-12 ENCOUNTER — Ambulatory Visit (HOSPITAL_BASED_OUTPATIENT_CLINIC_OR_DEPARTMENT_OTHER): Payer: Medicare Other | Admitting: Oncology

## 2011-09-12 VITALS — BP 114/62 | HR 88 | Temp 97.9°F | Ht 68.0 in | Wt 136.3 lb

## 2011-09-12 DIAGNOSIS — C61 Malignant neoplasm of prostate: Secondary | ICD-10-CM

## 2011-09-12 DIAGNOSIS — C786 Secondary malignant neoplasm of retroperitoneum and peritoneum: Secondary | ICD-10-CM

## 2011-09-12 DIAGNOSIS — D649 Anemia, unspecified: Secondary | ICD-10-CM

## 2011-09-12 DIAGNOSIS — M949 Disorder of cartilage, unspecified: Secondary | ICD-10-CM

## 2011-09-12 DIAGNOSIS — C801 Malignant (primary) neoplasm, unspecified: Secondary | ICD-10-CM

## 2011-09-12 LAB — COMPREHENSIVE METABOLIC PANEL
AST: 15 U/L (ref 0–37)
Albumin: 3.5 g/dL (ref 3.5–5.2)
Alkaline Phosphatase: 170 U/L — ABNORMAL HIGH (ref 39–117)
Glucose, Bld: 97 mg/dL (ref 70–99)
Potassium: 5 mEq/L (ref 3.5–5.3)
Sodium: 137 mEq/L (ref 135–145)
Total Protein: 6.6 g/dL (ref 6.0–8.3)

## 2011-09-12 LAB — CBC WITH DIFFERENTIAL/PLATELET
BASO%: 0.3 % (ref 0.0–2.0)
Basophils Absolute: 0 10*3/uL (ref 0.0–0.1)
EOS%: 1.2 % (ref 0.0–7.0)
HGB: 10.1 g/dL — ABNORMAL LOW (ref 13.0–17.1)
MCH: 31.3 pg (ref 27.2–33.4)
MONO#: 1.2 10*3/uL — ABNORMAL HIGH (ref 0.1–0.9)
RDW: 14.1 % (ref 11.0–14.6)
WBC: 10.6 10*3/uL — ABNORMAL HIGH (ref 4.0–10.3)
lymph#: 1.2 10*3/uL (ref 0.9–3.3)

## 2011-09-12 NOTE — Progress Notes (Signed)
Patient came in today as a new patient with his wife and daughter,he has one insurance.I did explain to the patient and his wife that we offer co-pay assistance for medication and for chemo drugs.I gave her [name] card because the patient wife said she would call us if they need any assistance.

## 2011-09-12 NOTE — Progress Notes (Signed)
Note dictated

## 2011-09-12 NOTE — Progress Notes (Signed)
CC:   Roy Levins, MD  REASON FOR CONSULTATION:  Advanced cancer, most likely, prostate in origin.  HISTORY OF PRESENT ILLNESS:  Roy Blackwell is a pleasant 78 year old gentleman, native of [place].  He has been living in the [place] area since the 1950s.  He retired from working at [employer].  He has had multiple occupations in the past.  This is a gentleman with comorbid conditions which include cardiomyopathy, also history of hypertension and venous insufficiency.  He also has had an unclear history of prostate cancer.  I do not have any records to that effect. A lot of the history is obtained by the patient and his family and he is a very poor historian.  Apparently around the late 1980s and 1990s, he was diagnosed with prostate cancer and he had an elevated PSA of 8.7 and was treated with radiation therapy around 1992.  The patient was followed at one point by Dr. Larey Dresser.  I saw a repeat biopsy back in 2001 that was really unrevealing.  The patient presented to Dr. Jonny Ruiz on May 5th to establish care and had complained of abdominal pain for about 2 months.  This is mild, left-sided, not radiating.  It is not associated with any neurological symptoms.  Not associated with any loss of bowel or bladder.  Did not really have any change in his urinary symptoms.  He did have weight loss and overall deterioration in his health.  Based on that, he had a CT scan done on Sep 01, 2011.  CT scan of the abdomen and pelvis showed a bulky retroperitoneal lymph node in infra-aortic area measuring 5.4 x 9.3 cm.  Adenopathy extends along to the iliac chain bilaterally measuring up to 2 cm, the left inguinal adenopathy measures up to 2 cm.  Lytic lesions seen in the visualized osseous structures.  Also there is an L4 vertebral body at risk of a pathological fracture.  Based on these findings, the patient was referred to me for evaluation for a presumed prostate cancer.  Of note, he  did have a PSA that, according to that particular lab, was more than 1460.  He had an elevated alkaline phosphatase of 165.  Upon interviewing Roy Blackwell today, he is minimally symptomatic.  He does report some abdominal discomfort, but not reporting any back pain or shoulder pain.  He appears to have some memory issues.  He does have some mobility issues he does use a walker to ambulate, but he is having more and more difficulty associated with that.  He had not had any falls.  He had not had any other neurological symptoms.  He had not had any back pain.  He had not had any shoulder pain.  He had not had any hip pain.  Had not had any pathological fractures to speak of.  He had not had any changes in his urination.  He has not had any hematuria or dysuria.  REVIEW OF SYSTEMS:  He does not report any headaches, blurry vision, double vision.  He does not report any motor or sensory neuropathy.  He does not report any alteration of mental status.  He does not report any psychiatric issues, depression.  He does not report any fever, chills, sweats.  He does not report any cough, hemoptysis, hematemesis.  No nausea, vomiting.  No abdominal pain, hematochezia, melena, genitourinary complaints.  Rest of the review of systems is unremarkable.  PAST MEDICAL HISTORY:  Significant for cardiomyopathy, also history  of venous insufficiency and history of phlebitis.  He is chronically anticoagulated on Coumadin.  History of a pulmonary nodule and hyperlipidemia.  MEDICATIONS:  He is on Tylenol, Bactrim, Coumadin and Lasix.  SOCIAL HISTORY:  He is married.  He has 1 daughter and 1 son.  Smokes about 1/2 pack cigarettes a day.  Quit drinking a while ago.  FAMILY HISTORY:  It is really unknown if he had any malignancies, unsure whether he has had any prostate cancer in the men in his family.  PHYSICAL EXAMINATION:  General:  Alert, awake gentleman, appeared in no active distress, rather frail.   Vital signs:  Blood pressure is 114/62, pulse 88, respiration 20, weighs 136 pounds.  HEENT:  Head is normocephalic, atraumatic.  Pupils equal, round, reactive to light. Oral mucosa moist and pink.  Neck:  Supple without adenopathy.  Heart: Regular rate and rhythm.  S1, S2.  Lungs:  Clear to auscultation.  No rhonchi, wheeze, dullness to percussion.  Abdomen:  Soft, nontender.  No hepatosplenomegaly.  Extremities:  No clubbing, cyanosis.  He has a slight 1+ edema.  LABORATORY DATA:  Showed a hemoglobin of 10.1, white cell count of 10.6, platelet count 725.  Differential showed 76% neutrophils, 11% lymphocytes.  His MCV is 94.4.  ASSESSMENT AND PLAN:  A pleasant 78 year old gentleman the following issues: 1. Advanced malignancy, had presented itself with bulky     retroperitoneal lymph nodes as well as a lytic bony lesion at L4.     He does have history of prostate cancer dating back to the early     1990s to late 1980s treated with radiation therapy, and an elevated     PSA of 1460.  The likelihood this is advanced prostate cancer is     very high at this time given his CT scan findings as well as the     elevated PSA and his prostate cancer history.  However,     pathological confirmation is critical at this point.  There are     multiple ways to do this.  I think re-biopsying his prostate may be     a low yield at this time.  It has been biopsied in 2001, although     it is unclear to me what prompted that particular biopsy.  One can     biopsy lymph nodes.  Could also biopsy the L4 lytic bony lesions.     I feel that, given the critical lesion in L4 and the fact that he     had a risk of vertebral collapse, I think would the ideal scenario     is to biopsy L4 in a process of a vertebroplasty not orally to     preserve that vertebra but also prevent further damage and decrease     the pain.  I will make a referral to intervention radiology to     accomplish that.  Hopefully we can  get the biopsy and perform     vertebroplasty all in 1 setting.  Once the biopsy confirms the     presence of prostate cancer, I am assuming this is hormone     sensitive and checking the testosterone level, then we can talk     about the treatments.  I have briefly alluded to the fact that     possibly a hormonal deprivation would be the way to go which would     be obviously merely palliative at this point.  I explained  to him     in details today that really this cancer, regardless of its kind,     is incurable and likely we are talking about palliation of symptoms     at this point.  I think he would be a reasonable candidate for     androgen deprivation if that is indeed what we are dealing with. 2. Bony disease.  Again once the confirmation of the cancer is     concluded, we can talk about bone-directed therapy such as Xgeva     and Zometa. 3. Abdominal discomfort.  Again this could be referred pain from his     L4 lesion.  He could have it also due to his abdominal adenopathy.     Once the cancer is treated, I think these symptoms will certainly     improve. 4. Possible hydronephrosis.  He does have a urology referral for that     evaluation and I think based on that evaluation, further     intervention might be necessary such as stenting or percutaneous     biopsy.  I am obtaining a creatinine at this time. 5. History of venous insufficiency or phlebitis.  I am not quite sure     why he is on Coumadin regarding that.  This could be related to his     cardiomyopathy but, nonetheless, I think Coumadin can be stopped     temporarily for his biopsy. 6. Mild anemia, thrombocytosis.  I think this is reactive due to his     malignancy. Overall, Roy Blackwell is a rather frail man with rather advanced malignancy and unfortunately his prognosis is rather poor.    ______________________________ Benjiman Core, M.D. FNS/MEDQ  D:  09/12/2011  T:  09/12/2011  Job:  [id number]

## 2011-09-12 NOTE — Telephone Encounter (Signed)
Gave calendar for June 2013 ML only

## 2011-09-14 ENCOUNTER — Other Ambulatory Visit: Payer: Self-pay | Admitting: Oncology

## 2011-09-14 ENCOUNTER — Telehealth: Payer: Self-pay | Admitting: *Deleted

## 2011-09-14 ENCOUNTER — Telehealth: Payer: Self-pay | Admitting: Oncology

## 2011-09-14 ENCOUNTER — Telehealth (HOSPITAL_COMMUNITY): Payer: Self-pay

## 2011-09-14 DIAGNOSIS — C61 Malignant neoplasm of prostate: Secondary | ICD-10-CM

## 2011-09-14 NOTE — Telephone Encounter (Signed)
I left a message for Dr. Alver Fisher nurse to call me at [phone]//Dr. Corliss Skains needs an MRI ordered T-9 through S-1 by Dr. Clelia Croft before we can proceed.

## 2011-09-14 NOTE — Telephone Encounter (Signed)
Patient was contacted today regarding MRI that needs to be obtained before the CT-guided biopsy.  Patient's wife was told that they would be contacted regarding the scheduling of this appointment, and to call our office if not contacted by the end of this week.

## 2011-09-14 NOTE — Telephone Encounter (Signed)
S/w wife re mri appt for 5/22. Wife also aware she will be contacted re bx appt. Cheryl in central aware of order - central will contact pt. Pt/wife already has June schedule.

## 2011-09-14 NOTE — Telephone Encounter (Signed)
Maness, Roy Blackwell 09/14/2011 8:53 AM Signed  I left a message for Dr. Alver Fisher nurse to call me at [phone]//Dr. Corliss Skains needs an MRI ordered T-9 through S-1 by Dr. Clelia Croft before we can proceed.

## 2011-09-14 NOTE — Telephone Encounter (Signed)
Spoke w/ [name].  Dr. Clelia Croft has entered in the MRI order.  The central schedulers will get that scheduled as I understand.  As soon as Dr. Carloyn Manner aware of the MRI being done, I can schedule a consult appt

## 2011-09-15 ENCOUNTER — Ambulatory Visit (INDEPENDENT_AMBULATORY_CARE_PROVIDER_SITE_OTHER): Payer: Medicare Other | Admitting: Pharmacist

## 2011-09-15 DIAGNOSIS — I82409 Acute embolism and thrombosis of unspecified deep veins of unspecified lower extremity: Secondary | ICD-10-CM

## 2011-09-20 ENCOUNTER — Other Ambulatory Visit: Payer: Self-pay | Admitting: Oncology

## 2011-09-20 ENCOUNTER — Ambulatory Visit (INDEPENDENT_AMBULATORY_CARE_PROVIDER_SITE_OTHER): Payer: Medicare Other | Admitting: Surgery

## 2011-09-20 ENCOUNTER — Ambulatory Visit (HOSPITAL_COMMUNITY)
Admission: RE | Admit: 2011-09-20 | Discharge: 2011-09-20 | Disposition: A | Discharge status: Home / Self Care | Payer: Medicare Other | Source: Ambulatory Visit | Attending: Oncology | Admitting: Oncology

## 2011-09-20 VITALS — BP 120/70 | HR 90 | Temp 98.4°F | Resp 18 | Wt 132.0 lb

## 2011-09-20 DIAGNOSIS — C7951 Secondary malignant neoplasm of bone: Secondary | ICD-10-CM | POA: Insufficient documentation

## 2011-09-20 DIAGNOSIS — C61 Malignant neoplasm of prostate: Secondary | ICD-10-CM

## 2011-09-20 DIAGNOSIS — R599 Enlarged lymph nodes, unspecified: Secondary | ICD-10-CM | POA: Insufficient documentation

## 2011-09-20 DIAGNOSIS — M51379 Other intervertebral disc degeneration, lumbosacral region without mention of lumbar back pain or lower extremity pain: Secondary | ICD-10-CM | POA: Insufficient documentation

## 2011-09-20 DIAGNOSIS — N323 Diverticulum of bladder: Secondary | ICD-10-CM | POA: Insufficient documentation

## 2011-09-20 DIAGNOSIS — M5126 Other intervertebral disc displacement, lumbar region: Secondary | ICD-10-CM | POA: Insufficient documentation

## 2011-09-20 DIAGNOSIS — I87039 Postthrombotic syndrome with ulcer and inflammation of unspecified lower extremity: Secondary | ICD-10-CM

## 2011-09-20 DIAGNOSIS — N3289 Other specified disorders of bladder: Secondary | ICD-10-CM | POA: Insufficient documentation

## 2011-09-20 DIAGNOSIS — M5137 Other intervertebral disc degeneration, lumbosacral region: Secondary | ICD-10-CM | POA: Insufficient documentation

## 2011-09-20 DIAGNOSIS — M47817 Spondylosis without myelopathy or radiculopathy, lumbosacral region: Secondary | ICD-10-CM | POA: Insufficient documentation

## 2011-09-20 NOTE — Progress Notes (Signed)
[facility]  Roy Kin, MD,  FACS [address] [place]  [address] [place], [place]    [address] Phone:  [phone] FAX:  [phone]   Re:   Roy Blackwell DOB:   October 28, 1928 MRN:   [id number]  ASSESSMENT AND PLAN: 1.  Bilateral chronic venous stasis ulcers, medially above his ankles.  We are changing leg Unna boot and dressings every 2 - 3 weeks.  He grew out Pseudomonas on cultures in May 2012 and has been on Septra since then.  According to Dr. Zachery Blackwell, every time he stopped the antibiotic, the wounds got worse.  So I will continue the Septra for now. He was on chronic Lasix which I stopped a month ago.  He is doing okay with that.  The wounds are stable, but have not really changed much since I have seen him.  We will photo about every 2 to 3 months as long as they are stable.  I'll see him back in 2 weeks for unna boot change.  His legs are stable since I first saw him in January.  His right leg looks better today.  2.  On coumadin.  Followed through the Roy Blackwell coumadin clinic.  Last INR 5.1.  (09/15/2011). This is better than before, but it sounds like he is being monitored weekly. 3.  Hypertrophic cardiomyopathy.  Last seen by Dr. Karie Blackwell. Roy Blackwell 08/15/2010.   4.  Weight loss (15 to 20 pounds in 9 months) and vague abdominal pain.  CT scan 08/31/2011 shows bulky intrathoracic and intraabdominal adenopathy.  He also has a pathologic fx of L4.  Saw Dr. Clelia Blackwell 09/12/2011 - probable metastatic prostate ca.  Patient for MRI today.  There is a plan to biopsy the bone or some location, but I am not sure when.  5.  He says he has a history of prostate cancer.  PSA >1460 (08/31/2011). 6.  He is more debilitated and comes in via wheelchair.  HISTORY OF PRESENT ILLNESS: Chief Complaint  Patient presents with  . Wound Check    Roy Blackwell is a 78 y.o. (DOB: 1928-05-31)  AA male who is a patient of Roy Barre, MD, MD and comes to me today for follow up of bilateral chronic  venous stasis ulcers.  He saw Dr. Jonny Blackwell who got a CT scan that shows  bulky intrathoracic and intraabdominal adenopathy.  He also has a pathologic fx of L4.   Roy Blackwell was managed by the wound care center, then by Dr. Zachery Blackwell. With Dr. Annette Stable retirement, he asked if I would look after this patient.  He has had success with Una boots and compression to improve these ulcer.  This is a long term process, but certainly compared to photos from Aug 2012, there has been some improvement in ulcers on both lower extremities.   His major problem is now metastatic cancer, probably prostate.  It is in the middle of being worked up.  PHYSICAL EXAM: BP 120/70  Pulse 90  Temp(Src) 98.4 F (36.9 C) (Temporal)  Resp 18  Wt 132 lb (59.875 kg)  Lower extremities:  1.8x  6.2 cm ulcer medial left leg (unchanged) and  1.0 x 2.0 cm ulcer medial right leg.    Both wounds are clean.  We will plan to photograph these every 2 months.  DATA REVIEWED: Recent CT scan.  Dr. Alver Fisher note.  Roy Kin, MD, FACS Office:  [phone]

## 2011-09-21 ENCOUNTER — Telehealth (HOSPITAL_COMMUNITY): Payer: Self-pay

## 2011-09-21 NOTE — Telephone Encounter (Signed)
I have printed out the MRI and gave it to Dr. Corliss Skains.  I will schedule pt as soon as he advises me

## 2011-09-22 ENCOUNTER — Ambulatory Visit (INDEPENDENT_AMBULATORY_CARE_PROVIDER_SITE_OTHER): Payer: Medicare Other | Admitting: *Deleted

## 2011-09-22 ENCOUNTER — Telehealth (HOSPITAL_COMMUNITY): Payer: Self-pay

## 2011-09-22 DIAGNOSIS — I82409 Acute embolism and thrombosis of unspecified deep veins of unspecified lower extremity: Secondary | ICD-10-CM

## 2011-09-22 NOTE — Telephone Encounter (Signed)
I called Mr. Dubu[place] 09-22-11 and spkoe with his spouse Verdell.  We scheduled his consult appt with Dr. Corliss Skains to discuss the BX and ablation

## 2011-09-27 ENCOUNTER — Other Ambulatory Visit: Payer: Self-pay | Admitting: Physician Assistant

## 2011-09-28 ENCOUNTER — Ambulatory Visit (HOSPITAL_COMMUNITY)
Admission: RE | Admit: 2011-09-28 | Discharge: 2011-09-28 | Disposition: A | Discharge status: Home / Self Care | Payer: Medicare Other | Source: Ambulatory Visit | Attending: Oncology | Admitting: Oncology

## 2011-09-28 DIAGNOSIS — C61 Malignant neoplasm of prostate: Secondary | ICD-10-CM

## 2011-09-29 ENCOUNTER — Ambulatory Visit (INDEPENDENT_AMBULATORY_CARE_PROVIDER_SITE_OTHER): Payer: Medicare Other | Admitting: *Deleted

## 2011-09-29 DIAGNOSIS — I82409 Acute embolism and thrombosis of unspecified deep veins of unspecified lower extremity: Secondary | ICD-10-CM

## 2011-09-29 LAB — POCT INR: INR: 3.8

## 2011-10-02 ENCOUNTER — Telehealth (HOSPITAL_COMMUNITY): Payer: Self-pay

## 2011-10-02 NOTE — Telephone Encounter (Signed)
I spoke with Mrs. Roy Blackwell in regards to [name] coumadin level.  I explained that it was still high at 3.8.  His next appt is Friday in hopes that the level is within limits

## 2011-10-04 ENCOUNTER — Ambulatory Visit (HOSPITAL_BASED_OUTPATIENT_CLINIC_OR_DEPARTMENT_OTHER): Payer: Medicare Other | Admitting: Oncology

## 2011-10-04 ENCOUNTER — Encounter: Payer: Self-pay | Admitting: Oncology

## 2011-10-04 ENCOUNTER — Telehealth: Payer: Self-pay | Admitting: Oncology

## 2011-10-04 VITALS — BP 132/61 | HR 88 | Temp 97.5°F | Ht 68.0 in | Wt 124.9 lb

## 2011-10-04 DIAGNOSIS — Z7901 Long term (current) use of anticoagulants: Secondary | ICD-10-CM

## 2011-10-04 DIAGNOSIS — C61 Malignant neoplasm of prostate: Secondary | ICD-10-CM

## 2011-10-04 DIAGNOSIS — C7951 Secondary malignant neoplasm of bone: Secondary | ICD-10-CM

## 2011-10-04 DIAGNOSIS — R109 Unspecified abdominal pain: Secondary | ICD-10-CM

## 2011-10-04 NOTE — Progress Notes (Signed)
Hematology and Oncology Follow Up Visit  Roy Blackwell [id number] Feb 04, 1929 78 y.o. 10/04/2011 4:17 PM Roy Barre, MD, MDJohn, Len [name], MD   Principle Diagnosis: Advanced prostate cancer, likely prostate  Prior Therapy: Radiation therapy in about 1992 (records unavailable to [place])  Current therapy: Undergoing further work-up  Interim History:  Roy Blackwell is an 78 year old male seen today for routine follow-up. We saw him in our office for the first time in May 2013. It was recommended that he undergo biopsy and vertebroplasty. He has seen Dr Corliss Skains regarding this. Due to his fluctuating INR, the procedure has not yet been scheduled. He is due for a repeat INR this Friday. Once INR is <1.5, then procedure will be scheduled. The patient reports ongoing abdominal and back pain; really unchanged from previous reports. Using Ultram with relief. No loss of control of bowel of bladder and no neurological changes. Denies chest pain, shortness of breath, nausea, vomiting. Mild constipation and uses dietary modification with relief. Reports that he is being seen at [facility] and receiving injections. He is not sure what medication he is receiving and I do not have these records available to me at this time.   Medications: I have reviewed the patient's current medications. Current outpatient prescriptions:acetaminophen (TYLENOL) 500 MG tablet, Take 500 mg by mouth as needed., Disp: , Rfl: ;  sulfamethoxazole-trimethoprim (BACTRIM DS,SEPTRA DS) 800-160 MG per tablet, Take 1 tablet by mouth 2 (two) times daily. , Disp: , Rfl: ;  traMADol (ULTRAM) 50 MG tablet, , Disp: , Rfl: ;  warfarin (COUMADIN) 5 MG tablet, Take As Directed per Coumadin Clinic , Disp: 60 tablet, Rfl: 3 clotrimazole-betamethasone (LOTRISONE) cream, Use as directed, Disp: 15 g, Rfl: 1;  phytonadione (MEPHYTON) 5 MG tablet, Take 1/2 tablet or 2.5 mg tablet, keep other half of tablet in case you need in future, Disp: 1 tablet, Rfl: 1;   DISCONTD: furosemide (LASIX) 20 MG tablet, Take 20 mg by mouth daily.  , Disp: , Rfl:  Current facility-administered medications:pneumococcal 23 valent vaccine (PNU-IMMUNE) injection 0.5 mL, 0.5 mL, Intramuscular, Once, Corwin Levins, MD  Allergies: No Known Allergies  Past Medical History, Surgical history, Social history, and Family History were reviewed and updated.  Review of Systems: Constitutional:  Negative for fever, chills, night sweats, anorexia, weight loss, pain. Cardiovascular: no chest pain or dyspnea on exertion Respiratory: no cough, shortness of breath, or wheezing Neurological: no TIA or stroke symptoms Dermatological: negative ENT: negative Skin: Negative. Gastrointestinal: no abdominal pain, change in bowel habits, or black or bloody stools Genito-Urinary: no dysuria, trouble voiding, or hematuria Hematological and Lymphatic: negative Breast: negative for breast lumps Musculoskeletal: negative Remaining ROS negative.  Physical Exam: Blood pressure 132/61, pulse 88, temperature 97.5 F (36.4 C), temperature source Oral, height 5\' 8"  (1.727 m), weight 124 lb 14.4 oz (56.654 kg). ECOG:  General appearance: alert, cooperative and no distress Head: Normocephalic, without obvious abnormality, atraumatic Neck: no adenopathy, no carotid bruit, no JVD, supple, symmetrical, trachea midline and thyroid not enlarged, symmetric, no tenderness/mass/nodules Lymph nodes: Cervical, supraclavicular, and axillary nodes normal. Heart:regular rate and rhythm, S1, S2 normal, no murmur, click, rub or gallop Lung:chest clear, no wheezing, rales, normal symmetric air entry, no tachypnea, retractions or cyanosis Abdomen: soft, non-tender, without masses or organomegaly EXT:no erythema, induration, or nodules   Lab Results: Lab Results  Component Value Date   WBC 10.6* 09/12/2011   HGB 10.1* 09/12/2011   HCT 30.4* 09/12/2011   MCV 94.4  09/12/2011   PLT 725* 09/12/2011     Chemistry        Component Value Date/Time   NA 137 09/12/2011 1007   K 5.0 09/12/2011 1007   CL 101 09/12/2011 1007   CO2 23 09/12/2011 1007   BUN 26* 09/12/2011 1007   CREATININE 1.70* 09/12/2011 1007      Component Value Date/Time   CALCIUM 10.4 09/12/2011 1007   ALKPHOS 170* 09/12/2011 1007   AST 15 09/12/2011 1007   ALT <8 09/12/2011 1007   BILITOT 0.2* 09/12/2011 1007    Results for Blackwell, Georgios R (MRN [id number]) as of 10/04/2011 10:50  Ref. Range 08/31/2011 14:36  PSA Latest Range: 0.10-4.00 ng/mL >1460.00 Repeated and verified X2. (H)   Radiological Studies:  *RADIOLOGY REPORT*  Clinical Data: Prostate cancer.  MRI THORACIC AND LUMBAR SPINE WITHOUT CONTRAST  Technique: Multiplanar and multiecho pulse sequences of the  thoracic and lumbar spine were obtained without intravenous  contrast.  Comparison: CT 09/01/2011  MRI THORACIC SPINE  Findings: The patient was not able to tolerate intravenous  contrast. The patient was moving extensively towards the end of  the exam which was terminated prior to contrast administration.  Small right pleural effusion is present.  Diffuse bony metastatic disease is seen throughout the thoracic  spine with multiple small bone marrow lesions. The largest lesion  involves most of the T10 vertebral body. No pathologic fracture.  No cord compression or cord edema is present. Disc degeneration  and mild spurring at T9-10. No acute disc protrusion. No cord  edema.  IMPRESSION:  Widespread metastatic disease throughout the thoracic spine. No  cord compression or fracture. Intravenous contrast was not  administered.  MRI LUMBAR SPINE  Findings: Intravenous contrast was not administered as the patient  was not able to hold still and complete that portion of the study.  Urinary bladder is distended with trabeculation and diverticula  compatible with chronic bladder outlet obstruction. Extensive  retroperitoneal adenopathy is present compatible with metastatic   disease to lymph nodes.  Metastatic disease is present throughout the lumbar spine.  Multiple bone marrow lesions are present, the largest lesion is in  the L4 vertebral body. No significant epidural tumor. There is  tumor in the iliac bone bilaterally and in the sacrum.  The conus medullaris is normal and terminates at L1-2.  L1-2: Small left paracentral disc protrusion with mild spinal  stenosis.  L2-3: Moderate disc degeneration and spondylosis. Facet  degeneration and mild spinal stenosis.  L3-4: Disc degeneration with disc bulging and spondylosis. Mild  spinal stenosis.  L4-5: Central disc protrusion. Associated spondylosis is present  with spinal stenosis and mild foraminal stenosis.  L5-S1: Disc degeneration with mild spondylosis. Mild foraminal  narrowing bilaterally.  IMPRESSION:  Metastatic disease throughout the lumbar spine. No fracture is  present.  Extensive degenerative change throughout the lumbar spine as  described above.  Extensive retroperitoneal adenopathy.  Original Report Authenticated By: Camelia Phenes, M.D.     Impression and Plan: This is an 78 year old gentleman with the following issues:  1. Advanced malignancy, had presented itself with bulky retroperitoneal lymph nodes as well as a lytic bony lesion at L4. He does have history of prostate cancer dating back to the early 1990s to late 1980s treated with radiation therapy, and an elevated PSA of 1460. The likelihood this is advanced prostate cancer is very high at this time given his CT scan findings as well as the elevated PSA and  his prostate cancer history. However, pathological confirmation is needed. Awaiting biopsy of the L4 lytic bony lesion with vertebroplasty to confirm diagnosis. Once the biopsy confirms the presence of prostate cancer, then we can talk about the treatments. I have requested record from [facility] to see what the patient is receiving at their office so that we do not  duplicate any treatments.  2. Bony disease. Once diagnosis confirmed, then will discuss role for bone-directed therapies such as Xgeva or Zometa. Again, will await records from [facility] to confirm that he is not already receiving this.  3. Abdominal discomfort. Could  Be referred pain from L4 lesion or due to abdominal adenopathy. Continue Ultram. Will likely improve once treatment for his cancer can begin.  4. Possible hydronephrosis. He is followed by Urology.  5. History venous insufficiency and phlebitis. On Coumadin and will need to come off this for biopsy.  6. Follow-up. In 6 weeks.  Spent more than half the time coordinating care.    [name] 6/5/20134:17 PM

## 2011-10-04 NOTE — Telephone Encounter (Signed)
Gv pt appt for july2013 °

## 2011-10-06 ENCOUNTER — Ambulatory Visit (INDEPENDENT_AMBULATORY_CARE_PROVIDER_SITE_OTHER): Payer: Medicare Other | Admitting: *Deleted

## 2011-10-06 ENCOUNTER — Encounter (INDEPENDENT_AMBULATORY_CARE_PROVIDER_SITE_OTHER): Payer: Self-pay | Admitting: Surgery

## 2011-10-06 ENCOUNTER — Ambulatory Visit (INDEPENDENT_AMBULATORY_CARE_PROVIDER_SITE_OTHER): Payer: Medicare Other | Admitting: Surgery

## 2011-10-06 VITALS — BP 112/58 | HR 66 | Temp 97.9°F | Resp 14 | Ht 68.0 in | Wt 120.5 lb

## 2011-10-06 DIAGNOSIS — I82409 Acute embolism and thrombosis of unspecified deep veins of unspecified lower extremity: Secondary | ICD-10-CM

## 2011-10-06 DIAGNOSIS — I87039 Postthrombotic syndrome with ulcer and inflammation of unspecified lower extremity: Secondary | ICD-10-CM

## 2011-10-06 LAB — POCT INR: INR: 1.9

## 2011-10-06 NOTE — Progress Notes (Signed)
[facility]  [name], MD,  FACS [address]  [address] [place], [place]    [address] Phone:  [phone] FAX:  [phone]   Re:   WISDOM RICKEY DOB:   March 11, 1897 MRN:   [id number]  ASSESSMENT AND PLAN: 1.  Bilateral chronic venous stasis ulcers, medially above his ankles.  We are changing leg Unna boot and dressings every 2 - 3 weeks.  He grew out Pseudomonas on cultures in May 2012 and has been on Septra since then.  According to Dr. Zachery Dakins, every time he stopped the antibiotic, the wounds got worse.  So I will continue the Septra for now. He was on chronic Lasix which I stopped a month ago.  He is doing okay with that.  The wounds are stable, but have not really changed much since I have seen him.  We will photo about every 2 to 3 months as long as they are stable.  I'll see him back in 2 weeks for unna boot change.  His legs are stable since I first saw him in January.  I looks like his right leg is close to healing.  2.  On coumadin.  Followed through the [place] coumadin clinic.    Last INR 1.9.  (6/77/2013).  3.  Hypertrophic cardiomyopathy.  Followed by Dr. Karie Schwalbe. [name] 08/15/2010.   4.  Weight loss (15 to 20 pounds in 9 months) and vague abdominal pain.    CT scan 08/31/2011 shows bulky intrathoracic and intraabdominal adenopathy.  He also has a pathologic fx of L4.    Saw Dr. Clelia Croft 09/12/2011 - probable metastatic prostate ca. There is a plan to biopsy the bone.  The patient is unsure of the bone doctor he is seeing.  5.  He says he has a history of prostate cancer.  PSA >1460 (08/31/2011). 6.  He is more debilitated and comes in via wheelchair.  HISTORY OF PRESENT ILLNESS: Chief Complaint  Patient presents with  . Wound Check    ARDON FRANKLIN is a 78 y.o. (DOB: 03/13/1929)  AA male who is a patient of [name], MD, MD and comes to me today for follow up of bilateral chronic venous stasis ulcers.  He saw Dr. Jonny Ruiz who got a CT scan that  shows  bulky intrathoracic and intraabdominal adenopathy.  He also has a pathologic fx of L4.   Mr. Kovaleski was managed by the wound care center, then by Dr. Zachery Dakins. With Dr. Annette Stable retirement, he asked if I would look after this patient.  He has had success with Una boots and compression to improve these ulcer.  This is a long term process, but certainly compared to photos from Aug 2012, there has been some improvement in ulcers on both lower extremities.   His major problem is now metastatic cancer, probably prostate.  It is in the middle of being worked up. He is otherwise doing well with no specific complaint.  PHYSICAL EXAM: BP 112/58  Pulse 66  Temp(Src) 97.9 F (36.6 C) (Temporal)  Resp 14  Ht 5\' 8"  (1.727 m)  Wt 120 lb 8 oz (54.658 kg)  BMI 18.32 kg/m2  Lower extremities:  2.0 x 6.8 cm ulcer medial left leg (unchanged) and  1.0 x 2.0 cm ulcer medial right leg (this seems to be trying to heal)  Both wounds are clean.  We will plan to photograph these every 2 months.  DATA REVIEWED: Dr. Alver Fisher note.  [name], MD, FACS  Office:  [phone]

## 2011-10-09 ENCOUNTER — Telehealth (HOSPITAL_COMMUNITY): Payer: Self-pay

## 2011-10-09 ENCOUNTER — Telehealth: Payer: Self-pay

## 2011-10-09 NOTE — Telephone Encounter (Signed)
Printed Mr. Ida Rogue INR results from Friday.  I gave them to Dr. Corliss Skains to find out if he was still wanting the INR below 1.5 before we schedule or if since the level was on the decline did he want to go ahead and schedule and see what the INR level is on day of procedure

## 2011-10-09 NOTE — Telephone Encounter (Signed)
Spoke with Elita Quick, PA in Interventional Radiology. Pt is pending procedure possibly on 10/13/11 and needs INR 1.5 or less. Please advise if OK to hold Coumadin prior to procedure.  Thanks.

## 2011-10-09 NOTE — Telephone Encounter (Signed)
Roy Blackwell called the coumadin clinic to make sure that everyone was on the same page on the levels.  We noticed on the results that they wanted a goal level of 2-3.  She left a message for [name] to call us back to make sure.

## 2011-10-10 ENCOUNTER — Other Ambulatory Visit (HOSPITAL_COMMUNITY): Payer: Self-pay | Admitting: Interventional Radiology

## 2011-10-10 ENCOUNTER — Telehealth (HOSPITAL_COMMUNITY): Payer: Self-pay

## 2011-10-10 DIAGNOSIS — IMO0002 Reserved for concepts with insufficient information to code with codable children: Secondary | ICD-10-CM

## 2011-10-10 DIAGNOSIS — M899 Disorder of bone, unspecified: Secondary | ICD-10-CM

## 2011-10-10 NOTE — Telephone Encounter (Signed)
Per Dr [name] ok to hold Coumadin for vertebroplasty procedure.  Placed call to interventional radiology PA desk [phone], [facility] for Pam ok for pt to hold Coumadin prior to procedure on 10/13/11.

## 2011-10-10 NOTE — Telephone Encounter (Signed)
I spoke with Roy Blackwell to let her know that we received the the INR level from the clinic.  I advised her that his appt is for Friday.  He needs to stop his coumadin on WEDS, THURS, and the day of.  I also called and left a message for Tiffany Muss that the pt will not be at the clinic for a LEVEL check on Friday.  We will check the level before procedure

## 2011-10-11 ENCOUNTER — Other Ambulatory Visit: Payer: Self-pay | Admitting: Radiology

## 2011-10-12 ENCOUNTER — Encounter (HOSPITAL_COMMUNITY): Payer: Self-pay | Admitting: Pharmacy Technician

## 2011-10-13 ENCOUNTER — Other Ambulatory Visit: Payer: Self-pay | Admitting: Radiology

## 2011-10-13 ENCOUNTER — Ambulatory Visit (HOSPITAL_COMMUNITY)
Admission: RE | Admit: 2011-10-13 | Discharge: 2011-10-13 | Disposition: A | Discharge status: Home / Self Care | Payer: Medicare Other | Source: Ambulatory Visit | Attending: Interventional Radiology | Admitting: Interventional Radiology

## 2011-10-13 ENCOUNTER — Telehealth (HOSPITAL_COMMUNITY): Payer: Self-pay

## 2011-10-13 ENCOUNTER — Encounter (HOSPITAL_COMMUNITY): Payer: Self-pay

## 2011-10-13 DIAGNOSIS — C7951 Secondary malignant neoplasm of bone: Secondary | ICD-10-CM | POA: Insufficient documentation

## 2011-10-13 DIAGNOSIS — C7952 Secondary malignant neoplasm of bone marrow: Secondary | ICD-10-CM | POA: Insufficient documentation

## 2011-10-13 DIAGNOSIS — Z538 Procedure and treatment not carried out for other reasons: Secondary | ICD-10-CM | POA: Insufficient documentation

## 2011-10-13 DIAGNOSIS — C61 Malignant neoplasm of prostate: Secondary | ICD-10-CM | POA: Insufficient documentation

## 2011-10-13 DIAGNOSIS — M899 Disorder of bone, unspecified: Secondary | ICD-10-CM

## 2011-10-13 DIAGNOSIS — IMO0002 Reserved for concepts with insufficient information to code with codable children: Secondary | ICD-10-CM

## 2011-10-13 LAB — BASIC METABOLIC PANEL
Calcium: 8.6 mg/dL (ref 8.4–10.5)
Creatinine, Ser: 1.18 mg/dL (ref 0.50–1.35)
GFR calc non Af Amer: 56 mL/min — ABNORMAL LOW (ref >90)
Glucose, Bld: 89 mg/dL (ref 70–99)
Sodium: 139 mEq/L (ref 135–145)

## 2011-10-13 LAB — CBC
MCH: 30.6 pg (ref 26.0–34.0)
MCHC: 33.4 g/dL (ref 30.0–36.0)
Platelets: 655 10*3/uL — ABNORMAL HIGH (ref 150–400)
RBC: 3.17 MIL/uL — ABNORMAL LOW (ref 4.22–5.81)
RDW: 15.6 % — ABNORMAL HIGH (ref 11.5–15.5)

## 2011-10-13 LAB — PROTIME-INR: Prothrombin Time: 21.9 seconds — ABNORMAL HIGH (ref 11.6–15.2)

## 2011-10-13 LAB — APTT: aPTT: 34 seconds (ref 24–37)

## 2011-10-13 MED ORDER — SODIUM CHLORIDE 0.9 % IV SOLN
Freq: Once | INTRAVENOUS | Status: AC
  Administered 2011-10-13: 1000 mL via INTRAVENOUS

## 2011-10-13 MED ORDER — CEFAZOLIN SODIUM 1-5 GM-% IV SOLN: 1.0000 g | Freq: Once | INTRAVENOUS | Status: DC

## 2011-10-13 NOTE — H&P (Signed)
Chief Complaint: Back pain Referring Physician:Shadad HPI: Roy Blackwell is an 78 y.o. male with history of prostate cancer who developed back pain. In the course of his workup he was found to have metastatic appearing bony lesions, specifically at the T10 and L4 levels. He saw Dr. Deveshwar in consultation and was scheduled for vertebral augmentation and possible ablation of these two levels. He was on Coumadin but it has been held a few days. His last outpt INR was 1.9 and it appears he also got some po Vit K yesterday He denies any recent illnesses or fevers.  Past Medical History:  Past Medical History  Diagnosis Date  . Venous insufficiency     His diagnosis this is a note on Roy Blackwell he was diagnosed as having chronic thrombophlebitis when he was just in back in the 50s and has seen numerous surgeons Dr. Lee Dr. 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 Dr. Brett he continued his Coumadin management but he has no history of atri  . Hypertrophic cardiomyopathy   . Prostate cancer 08/27/2011  . Hypertension 08/27/2011  . Pulmonary nodule 08/27/2011    2008  . Hyperlipidemia 08/27/2011  . Inguinal lymphadenopathy 08/31/2011    Bulky bilat 2009 CT  . Warfarin anticoagulation 08/31/2011  . Hydronephrosis 09/01/2011  . CKD (chronic kidney disease) 09/01/2011  . Abdominal lymphadenopathy 09/01/2011    Past Surgical History: No past surgical history on file.  Family History: No family history on file.  Social History:  reports that he has been smoking.  He has never used smokeless tobacco. He reports that he does not drink alcohol or use illicit drugs.  Allergies: No Known Allergies  Medications: acetaminophen (TYLENOL) 500 MG tablet (Taking) Sig - Route: Take 500 mg by mouth as needed. For pain. - Oral Class: Historical Med Number of times this order has been changed since signing: 2  Order [address] clotrimazole-betamethasone (LOTRISONE) cream (Taking) Sig - Route: Apply 1 application topically 2 (two) times daily as needed. For itching. - Topical Class: Historical Med Number of times this order has been changed since signing: 3 Order [address] phytonadione (VITAMIN K) 5 MG tablet (Taking) Sig - Route: Take 2.5 mg by mouth once. Save other 1/2 in case you need it in the future. - Oral Class: Historical Med Number of times this order has been changed since signing: 2 Order [address] sulfamethoxazole-trimethoprim (BACTRIM DS,SEPTRA DS) 800-160 MG per tablet (Taking) Sig - Route: Take 1 tablet by mouth 2 (two) times daily. - Oral Class: Historical Med Number of times this order has been changed since signing: 2 Order [address] Tamsulosin HCl (FLOMAX) 0.4 MG CAPS (Taking) 09/26/2011 Sig - Route: Take 0.4 mg by mouth daily. - Oral Class: Historical Med Number of times this order has been changed since signing: 3 Order [address] traMADol (ULTRAM) 50 MG tablet (Taking) 09/07/2011 Sig - Route: Take 50 mg by mouth every 6 (six) hours as needed. For pain. - Oral Class: Historical Med Number of times this order has been changed since signing: 3 Order [address] warfarin (COUMADIN) 5 MG tablet (Taking) Sig - Route: Take 2.5-5 mg by mouth daily. Take 2.5 MG every day except Thursdays; on Thursdays take 5 MG. - Oral Class: Historical Med Number of times this order has been changed since signing: 1   Please HPI for pertinent positives, otherwise   complete 10 system ROS negative.  Physical Exam: There were no vitals taken for this visit. There is no height or weight on file to calculate BMI.   General Appearance:  Alert, cooperative, no distress, appears stated age  Head:  Normocephalic, without obvious abnormality, atraumatic  ENT: Unremarkable  Neck: Supple, symmetrical, trachea midline, no adenopathy, thyroid: not enlarged, symmetric, no tenderness/mass/nodules  Lungs:   Clear to  auscultation bilaterally, no w/r/r, respirations unlabored without use of accessory muscles.  Heart:  Regular rate and rhythm, S1, S2 normal, no murmur, rub or gallop. Carotids 2+ without bruit.  Abdomen:   Soft, non-tender, non distended. Bowel sounds active all four quadrants,  no masses, no organomegaly.  Extremities: Extremities normal, atraumatic, no cyanosis or edema  Neurologic: Normal affect, no gross deficits.   Results for orders placed during the hospital encounter of 10/13/11 (from the past 48 hour(s))  APTT     Status: Normal   Collection Time   10/13/11 10:03 AM      Component Value Range Comment   aPTT 34  24 - 37 seconds   CBC     Status: Abnormal   Collection Time   10/13/11 10:03 AM      Component Value Range Comment   WBC 8.4  4.0 - 10.5 K/uL    RBC 3.17 (*) 4.22 - 5.81 MIL/uL    Hemoglobin 9.7 (*) 13.0 - 17.0 g/dL    HCT 29.0 (*) 39.0 - 52.0 %    MCV 91.5  78.0 - 100.0 fL    MCH 30.6  26.0 - 34.0 pg    MCHC 33.4  30.0 - 36.0 g/dL    RDW 15.6 (*) 11.5 - 15.5 %    Platelets 655 (*) 150 - 400 K/uL   PROTIME-INR     Status: Abnormal   Collection Time   10/13/11 10:03 AM      Component Value Range Comment   Prothrombin Time 21.9 (*) 11.6 - 15.2 seconds    INR 1.88 (*) 0.00 - 1.49    No results found.  Assessment/Plan Hx prostate cancer Bony lesions of T10 and L4 concerning for metastasis Plan for vertebral augmentation possible ablation of these levels today. Procedure reviewed including risks and complications. Unfortunately, his INR has come back elevated at 1.88 and Dr. Deveshwar would like to reschedule procedure for early next week which will allow his INR to drift down to a safe level. He should stay off his Coumadin until procedure date.  Roy Massiah PA-C 10/13/2011, 11:05 AM     

## 2011-10-13 NOTE — Telephone Encounter (Signed)
Roy Blackwell left a vm asking what time Roy Blackwell needs to be here since his appt was at 55.  i advised her that he needed to be here at 930 to ssc for an 11am appt

## 2011-10-14 ENCOUNTER — Telehealth: Payer: Self-pay | Admitting: Cardiology

## 2011-10-14 NOTE — Telephone Encounter (Signed)
I called patient and spoke with him.  They are putting off his procedure until next week because of anticoagulation.  Appetite not so good.  Does have back pain.  Will continue to check on status.

## 2011-10-16 ENCOUNTER — Other Ambulatory Visit: Payer: Self-pay | Admitting: Radiology

## 2011-10-17 ENCOUNTER — Other Ambulatory Visit (HOSPITAL_COMMUNITY): Payer: Self-pay | Admitting: Interventional Radiology

## 2011-10-17 ENCOUNTER — Encounter (HOSPITAL_COMMUNITY): Payer: Self-pay

## 2011-10-17 ENCOUNTER — Ambulatory Visit (HOSPITAL_COMMUNITY)
Admission: RE | Admit: 2011-10-17 | Discharge: 2011-10-17 | Disposition: A | Discharge status: Home / Self Care | Payer: Medicare Other | Source: Ambulatory Visit | Attending: Interventional Radiology | Admitting: Interventional Radiology

## 2011-10-17 VITALS — BP 151/77 | HR 80 | Temp 97.5°F | Resp 20 | Ht 68.0 in | Wt 130.0 lb

## 2011-10-17 DIAGNOSIS — C7951 Secondary malignant neoplasm of bone: Secondary | ICD-10-CM | POA: Insufficient documentation

## 2011-10-17 DIAGNOSIS — C61 Malignant neoplasm of prostate: Secondary | ICD-10-CM | POA: Insufficient documentation

## 2011-10-17 DIAGNOSIS — N1330 Unspecified hydronephrosis: Secondary | ICD-10-CM | POA: Insufficient documentation

## 2011-10-17 DIAGNOSIS — N189 Chronic kidney disease, unspecified: Secondary | ICD-10-CM | POA: Insufficient documentation

## 2011-10-17 DIAGNOSIS — M8448XA Pathological fracture, other site, initial encounter for fracture: Secondary | ICD-10-CM | POA: Insufficient documentation

## 2011-10-17 DIAGNOSIS — I422 Other hypertrophic cardiomyopathy: Secondary | ICD-10-CM | POA: Insufficient documentation

## 2011-10-17 DIAGNOSIS — IMO0002 Reserved for concepts with insufficient information to code with codable children: Secondary | ICD-10-CM

## 2011-10-17 DIAGNOSIS — M899 Disorder of bone, unspecified: Secondary | ICD-10-CM

## 2011-10-17 DIAGNOSIS — Z8546 Personal history of malignant neoplasm of prostate: Secondary | ICD-10-CM | POA: Insufficient documentation

## 2011-10-17 DIAGNOSIS — I129 Hypertensive chronic kidney disease with stage 1 through stage 4 chronic kidney disease, or unspecified chronic kidney disease: Secondary | ICD-10-CM | POA: Insufficient documentation

## 2011-10-17 DIAGNOSIS — E785 Hyperlipidemia, unspecified: Secondary | ICD-10-CM | POA: Insufficient documentation

## 2011-10-17 LAB — CBC
MCH: 30.4 pg (ref 26.0–34.0)
MCHC: 33.3 g/dL (ref 30.0–36.0)
MCV: 91.3 fL (ref 78.0–100.0)
Platelets: 465 10*3/uL — ABNORMAL HIGH (ref 150–400)
RBC: 3.58 MIL/uL — ABNORMAL LOW (ref 4.22–5.81)
RDW: 16.4 % — ABNORMAL HIGH (ref 11.5–15.5)

## 2011-10-17 LAB — BASIC METABOLIC PANEL
BUN: 11 mg/dL (ref 6–23)
Calcium: 9.1 mg/dL (ref 8.4–10.5)
Creatinine, Ser: 0.91 mg/dL (ref 0.50–1.35)
GFR calc Af Amer: >90 mL/min (ref >90)
GFR calc non Af Amer: 78 mL/min — ABNORMAL LOW (ref >90)
Glucose, Bld: 97 mg/dL (ref 70–99)

## 2011-10-17 LAB — PROTIME-INR: Prothrombin Time: 14.6 seconds (ref 11.6–15.2)

## 2011-10-17 MED ORDER — SODIUM CHLORIDE 0.9 % IV SOLN
Freq: Once | INTRAVENOUS | Status: AC
  Administered 2011-10-17: 08:00:00 via INTRAVENOUS

## 2011-10-17 MED ORDER — MIDAZOLAM HCL 5 MG/5ML IJ SOLN
INTRAMUSCULAR | Status: DC | PRN
  Administered 2011-10-17: 0.5 mg via INTRAVENOUS
  Administered 2011-10-17 (×2): 1 mg via INTRAVENOUS
  Administered 2011-10-17: 0.5 mg via INTRAVENOUS
  Administered 2011-10-17 (×3): 1 mg via INTRAVENOUS

## 2011-10-17 MED ORDER — HYDROMORPHONE HCL PF 1 MG/ML IJ SOLN
INTRAMUSCULAR | Status: AC
  Filled 2011-10-17: qty 2

## 2011-10-17 MED ORDER — TOBRAMYCIN SULFATE 1.2 G IJ SOLR
INTRAMUSCULAR | Status: AC
  Filled 2011-10-17: qty 1.2

## 2011-10-17 MED ORDER — FLUMAZENIL 0.5 MG/5ML IV SOLN
INTRAVENOUS | Status: AC
  Administered 2011-10-17: 0.5 mg
  Filled 2011-10-17: qty 5

## 2011-10-17 MED ORDER — NALOXONE HCL 0.4 MG/ML IJ SOLN
INTRAMUSCULAR | Status: AC
  Filled 2011-10-17: qty 1

## 2011-10-17 MED ORDER — CEFAZOLIN SODIUM 1-5 GM-% IV SOLN
1.0000 g | Freq: Once | INTRAVENOUS | Status: AC
  Administered 2011-10-17: 1 g via INTRAVENOUS

## 2011-10-17 MED ORDER — MIDAZOLAM HCL 2 MG/2ML IJ SOLN
INTRAMUSCULAR | Status: AC
  Filled 2011-10-17: qty 4

## 2011-10-17 MED ORDER — FENTANYL CITRATE 0.05 MG/ML IJ SOLN
INTRAMUSCULAR | Status: AC
  Filled 2011-10-17: qty 6

## 2011-10-17 MED ORDER — FENTANYL CITRATE 0.05 MG/ML IJ SOLN
INTRAMUSCULAR | Status: DC | PRN
  Administered 2011-10-17: 25 ug via INTRAVENOUS
  Administered 2011-10-17: 50 ug via INTRAVENOUS
  Administered 2011-10-17 (×5): 25 ug via INTRAVENOUS

## 2011-10-17 MED ORDER — MIDAZOLAM HCL 2 MG/2ML IJ SOLN
INTRAMUSCULAR | Status: AC
  Filled 2011-10-17: qty 6

## 2011-10-17 MED ORDER — HYDROMORPHONE HCL PF 1 MG/ML IJ SOLN
INTRAMUSCULAR | Status: DC | PRN
  Administered 2011-10-17 (×3): 1 mg

## 2011-10-17 MED ORDER — SODIUM CHLORIDE 0.9 % IV SOLN: INTRAVENOUS | Status: AC

## 2011-10-17 NOTE — Procedures (Signed)
S/P Tumor ablation at T 10 and L 4 with prostate cancer hx

## 2011-10-17 NOTE — Discharge Instructions (Signed)
Use walker for 2 weeks  No stooping,or lifting more than 10 lbs for 2 weeks No driving for 2 eeks  Vertebroplasty Care After A vertebroplasty is procedure that helps relieve pain caused by breaks (fractures) in the spine. A special kind of "bone cement" is put into the spine. The cement hardens quickly. It also helps the break stay in a steady state (stabilizes).  HOME CARE  Rest in your bed (bed rest) as told by your doctor.   Do not lift things over 8 pounds (3.6 kilograms) for the next few days.   Return to your normal routine as told by your doctor.   Only take medicine as told by your doctor.   Change bandages (dressings) as told by your doctor.  GET HELP RIGHT AWAY IF:   You are bleeding from the needle site.   You have puffiness (swelling) or redness at the needle site.   You are very sore near the needle site.   Yellowish white fluid (pus) is coming out of the needle site.   You have a temperature by mouth above 102 F (38.9 C), not controlled by medicine.   You have trouble breathing or you feel short of breath.   You throw up (vomit).   You feel sick to your stomach (nauseous).   You have pain or cramping in your belly (abdomen), and it will not stop.  If you go to the Emergency Room, tell the nurse that you had this procedure done. MAKE SURE YOU:  Understand these instructions.   Will watch your condition.   Will get help right away if you are not doing well or get worse.  Document Released: 07/12/2009 Document Revised: 04/06/2011 Document Reviewed: 09/21/2010 [facility] Patient Information 2012 [place], [place].

## 2011-10-17 NOTE — ED Notes (Signed)
Pt contiuously moving on table attempts to sedate unsuccessful so far. Pt needed multiple meds for ablation/and continuous movement

## 2011-10-17 NOTE — H&P (View-Only) (Signed)
Chief Complaint: Back pain Referring Physician:Shadad HPI: Roy Blackwell is an 78 y.o. male with history of prostate cancer who developed back pain. In the course of his workup he was found to have metastatic appearing bony lesions, specifically at the T10 and L4 levels. He saw Dr. Corliss Skains in consultation and was scheduled for vertebral augmentation and possible ablation of these two levels. He was on Coumadin but it has been held a few days. His last outpt INR was 1.9 and it appears he also got some po Vit K yesterday He denies any recent illnesses or fevers.  Past Medical History:  Past Medical History  Diagnosis Date  . Venous insufficiency     His diagnosis this is a note on Roy Blackwell he was diagnosed as having chronic thrombophlebitis when he was just in back in the 50s and has seen numerous surgeons Dr. Nedra Hai Dr. 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 Dr. Genelle Bal he continued his Coumadin management but he has no history of atri  . Hypertrophic cardiomyopathy   . Prostate cancer 08/27/2011  . Hypertension 08/27/2011  . Pulmonary nodule 08/27/2011    2008  . Hyperlipidemia 08/27/2011  . Inguinal lymphadenopathy 08/31/2011    Bulky bilat 2009 CT  . Warfarin anticoagulation 08/31/2011  . Hydronephrosis 09/01/2011  . CKD (chronic kidney disease) 09/01/2011  . Abdominal lymphadenopathy 09/01/2011    Past Surgical History: No past surgical history on file.  Family History: No family history on file.  Social History:  reports that he has been smoking.  He has never used smokeless tobacco. He reports that he does not drink alcohol or use illicit drugs.  Allergies: No Known Allergies  Medications: acetaminophen (TYLENOL) 500 MG tablet (Taking) Sig - Route: Take 500 mg by mouth as needed. For pain. - Oral Class: Historical Med Number of times this order has been changed since signing: 2  Order [address] clotrimazole-betamethasone (LOTRISONE) cream (Taking) Sig - Route: Apply 1 application topically 2 (two) times daily as needed. For itching. - Topical Class: Historical Med Number of times this order has been changed since signing: 3 Order [address] phytonadione (VITAMIN K) 5 MG tablet (Taking) Sig - Route: Take 2.5 mg by mouth once. Save other 1/2 in case you need it in the future. - Oral Class: Historical Med Number of times this order has been changed since signing: 2 Order [address] sulfamethoxazole-trimethoprim (BACTRIM DS,SEPTRA DS) 800-160 MG per tablet (Taking) Sig - Route: Take 1 tablet by mouth 2 (two) times daily. - Oral Class: Historical Med Number of times this order has been changed since signing: 2 Order [address] Tamsulosin HCl (FLOMAX) 0.4 MG CAPS (Taking) 09/26/2011 Sig - Route: Take 0.4 mg by mouth daily. - Oral Class: Historical Med Number of times this order has been changed since signing: 3 Order [address] traMADol (ULTRAM) 50 MG tablet (Taking) 09/07/2011 Sig - Route: Take 50 mg by mouth every 6 (six) hours as needed. For pain. - Oral Class: Historical Med Number of times this order has been changed since signing: 3 Order [address] warfarin (COUMADIN) 5 MG tablet (Taking) Sig - Route: Take 2.5-5 mg by mouth daily. Take 2.5 MG every day except Thursdays; on Thursdays take 5 MG. - Oral Class: Historical Med Number of times this order has been changed since signing: 1   Please HPI for pertinent positives, otherwise  complete 10 system ROS negative.  Physical Exam: There were no vitals taken for this visit. There is no height or weight on file to calculate BMI.   General Appearance:  Alert, cooperative, no distress, appears stated age  Head:  Normocephalic, without obvious abnormality, atraumatic  ENT: Unremarkable  Neck: Supple, symmetrical, trachea midline, no adenopathy, thyroid: not enlarged, symmetric, no tenderness/mass/nodules  Lungs:   Clear to  auscultation bilaterally, no w/r/r, respirations unlabored without use of accessory muscles.  Heart:  Regular rate and rhythm, S1, S2 normal, no murmur, rub or gallop. Carotids 2+ without bruit.  Abdomen:   Soft, non-tender, non distended. Bowel sounds active all four quadrants,  no masses, no organomegaly.  Extremities: Extremities normal, atraumatic, no cyanosis or edema  Neurologic: Normal affect, no gross deficits.   Results for orders placed during the hospital encounter of 10/13/11 (from the past 48 hour(s))  APTT     Status: Normal   Collection Time   10/13/11 10:03 AM      Component Value Range Comment   aPTT 34  24 - 37 seconds   CBC     Status: Abnormal   Collection Time   10/13/11 10:03 AM      Component Value Range Comment   WBC 8.4  4.0 - 10.5 K/uL    RBC 3.17 (*) 4.22 - 5.81 MIL/uL    Hemoglobin 9.7 (*) 13.0 - 17.0 g/dL    HCT 16.1 (*) 09.6 - 52.0 %    MCV 91.5  78.0 - 100.0 fL    MCH 30.6  26.0 - 34.0 pg    MCHC 33.4  30.0 - 36.0 g/dL    RDW 04.5 (*) 40.9 - 15.5 %    Platelets 655 (*) 150 - 400 K/uL   PROTIME-INR     Status: Abnormal   Collection Time   10/13/11 10:03 AM      Component Value Range Comment   Prothrombin Time 21.9 (*) 11.6 - 15.2 seconds    INR 1.88 (*) 0.00 - 1.49    No results found.  Assessment/Plan Hx prostate cancer Bony lesions of T10 and L4 concerning for metastasis Plan for vertebral augmentation possible ablation of these levels today. Procedure reviewed including risks and complications. Unfortunately, his INR has come back elevated at 1.88 and Dr. Corliss Skains would like to reschedule procedure for early next week which will allow his INR to drift down to a safe level. He should stay off his Coumadin until procedure date.  Brayton El PA-C 10/13/2011, 11:05 AM

## 2011-10-17 NOTE — Interval H&P Note (Cosign Needed)
History and Physical Interval Note:  10/17/2011 9:14 AM  Roy Blackwell  has presented today for T10 and L4 kyphoplasty with the diagnosis of T10 and L4 compression fx.  He was scheduled last week but INR was too elevated to safely proceed, he has now held his Coumadin several more days.The various methods of treatment have been discussed with the patient and family. After consideration of risks, benefits and other options for treatment, the patient has consented to kyphoplasty of T10 and L4 as intervention .  The patient's history has been reviewed, patient examined, no change in status, stable for surgery. His INR is now normal at 1.1.  I have reviewed the patients' chart and labs.  Questions were answered to the patient's satisfaction.   BP 123/67  Pulse 76  Temp 97.4 F (36.3 C) (Oral)  Resp 18  Ht 5\' 8"  (1.727 m)  Wt 130 lb (58.968 kg)  BMI 19.77 kg/m2  SpO2 99% Lungs: CTA without w/r/r Heart: Regular   Seen for Dr. Harlene Ramus, [facility] PA-C

## 2011-10-19 ENCOUNTER — Other Ambulatory Visit (HOSPITAL_COMMUNITY): Payer: Self-pay | Admitting: Interventional Radiology

## 2011-10-19 ENCOUNTER — Telehealth (HOSPITAL_COMMUNITY): Payer: Self-pay

## 2011-10-19 DIAGNOSIS — IMO0002 Reserved for concepts with insufficient information to code with codable children: Secondary | ICD-10-CM

## 2011-10-19 NOTE — Telephone Encounter (Signed)
Spoke with Mrs. Buffone and gave her the f/u apt for Mr Schrieber

## 2011-10-20 ENCOUNTER — Encounter (INDEPENDENT_AMBULATORY_CARE_PROVIDER_SITE_OTHER): Payer: Self-pay | Admitting: Surgery

## 2011-10-20 ENCOUNTER — Ambulatory Visit (INDEPENDENT_AMBULATORY_CARE_PROVIDER_SITE_OTHER): Payer: Medicare Other | Admitting: *Deleted

## 2011-10-20 ENCOUNTER — Ambulatory Visit (INDEPENDENT_AMBULATORY_CARE_PROVIDER_SITE_OTHER): Payer: Medicare Other | Admitting: Surgery

## 2011-10-20 VITALS — BP 120/70 | HR 72 | Temp 97.9°F | Resp 18 | Wt 121.5 lb

## 2011-10-20 DIAGNOSIS — I87039 Postthrombotic syndrome with ulcer and inflammation of unspecified lower extremity: Secondary | ICD-10-CM

## 2011-10-20 DIAGNOSIS — I82409 Acute embolism and thrombosis of unspecified deep veins of unspecified lower extremity: Secondary | ICD-10-CM

## 2011-10-20 NOTE — Progress Notes (Signed)
[facility]  [name], MD,  FACS [address]  [address] [place], [place]    [address] Phone:  [phone] FAX:  [phone]   Re:   Roy Blackwell DOB:   Apr 14, 1929 MRN:   [id number]  ASSESSMENT AND PLAN: 1.  Bilateral chronic venous stasis ulcers, medially above his ankles.  We are changing leg Unna boot and dressings every 2 - 3 weeks.  He grew out Pseudomonas on cultures in May 2012 and has been on Septra since then.  According to Dr. Zachery Dakins, every time he stopped the antibiotic, the wounds got worse.  So I will continue the Septra for now.   The wounds are stable, but have not really changed much since I have seen him.  We will photo about every 2 to 3 months as long as they are stable.  I'll see him back in 2 weeks for unna boot change.  His legs are stable since I first saw him in January.    It looks like his right leg is close to healing.  2.  On coumadin.  Followed through the [place] coumadin clinic.    Last INR 1.2.  (10/20/2011).  INR down because his coumadin was held for the back procedure. 3.  Hypertrophic cardiomyopathy.  Followed by Dr. Karie Schwalbe. [name] 76/16/2012.   4.  Weight loss (15 to 20 pounds in 9 months) and vague abdominal pain.    CT scan 08/31/2011 shows bulky intrathoracic and intraabdominal adenopathy.  He also has a pathologic fx of L4.    He had a IR bone tumor ablation 10/17/2011 by Dr. Corliss Skains.  Saw Dr. Clelia Croft 09/12/2011 - probable metastatic prostate ca. There is a plan to biopsy the bone.  The patient is unsure of the bone doctor he is seeing.  5.  He says he has a history of prostate cancer.  PSA >1460 (08/31/2011). 6.  He is more debilitated and comes in via wheelchair.  HISTORY OF PRESENT ILLNESS: Chief Complaint  Patient presents with  . Wound Check    MONTREZ MARIETTA is a 78 y.o. (DOB: 1928/08/25)  AA male who is a patient of [name], MD and comes to me today for follow up of bilateral chronic venous stasis ulcers.   He saw Dr. Jonny Ruiz who got a CT scan that shows  bulky intrathoracic and intraabdominal adenopathy.  He also has a pathologic fx of L4.   Mr. Minchey was managed by the wound care center, then by Dr. Zachery Dakins. With Dr. Annette Stable retirement, he asked if I would look after this patient.  He has had success with Una boots and compression to improve these ulcer.  This is a long term process, but certainly compared to photos from Aug 2012, there has been some improvement in ulcers on both lower extremities.   His major problem is now metastatic cancer, probably prostate. He still is having back pain, but better.  He has been treated for his back pathologic fracture.  He thinks he is to see Dr. Juliette Alcide in a couple of weeks.  His appetite remains poor.  PHYSICAL EXAM: BP 120/70  Pulse 72  Temp 97.9 F (36.6 C) (Temporal)  Resp 18  Wt 121 lb 8 oz (55.112 kg)  Lower extremities:  6.5 x 1.5 cm ulcer medial left leg (unchanged) and  1.4. X 0.5 cm ulcer medial right leg (this seems to be trying to heal)  Both wounds are clean.  We will plan to photograph  these every 2 months.  DATA REVIEWED: Dr. Alver Fisher note.  [name], MD, FACS Office:  [phone]

## 2011-10-27 ENCOUNTER — Ambulatory Visit (INDEPENDENT_AMBULATORY_CARE_PROVIDER_SITE_OTHER): Payer: Medicare Other

## 2011-10-27 DIAGNOSIS — I82409 Acute embolism and thrombosis of unspecified deep veins of unspecified lower extremity: Secondary | ICD-10-CM

## 2011-10-27 LAB — POCT INR: INR: 2.4

## 2011-10-27 MED ORDER — WARFARIN SODIUM 5 MG PO TABS: ORAL_TABLET | ORAL | Status: DC

## 2011-10-31 ENCOUNTER — Ambulatory Visit (HOSPITAL_COMMUNITY)
Admission: RE | Admit: 2011-10-31 | Discharge: 2011-10-31 | Disposition: A | Discharge status: Home / Self Care | Payer: Medicare Other | Source: Ambulatory Visit | Attending: Interventional Radiology | Admitting: Interventional Radiology

## 2011-10-31 DIAGNOSIS — IMO0002 Reserved for concepts with insufficient information to code with codable children: Secondary | ICD-10-CM

## 2011-11-03 ENCOUNTER — Encounter (INDEPENDENT_AMBULATORY_CARE_PROVIDER_SITE_OTHER): Payer: Self-pay | Admitting: General Surgery

## 2011-11-03 ENCOUNTER — Ambulatory Visit (INDEPENDENT_AMBULATORY_CARE_PROVIDER_SITE_OTHER): Payer: Medicare Other | Admitting: General Surgery

## 2011-11-03 VITALS — BP 117/64 | HR 72 | Temp 98.0°F | Resp 16 | Ht 68.0 in | Wt 115.6 lb

## 2011-11-03 DIAGNOSIS — IMO0001 Reserved for inherently not codable concepts without codable children: Secondary | ICD-10-CM

## 2011-11-03 DIAGNOSIS — Z4800 Encounter for change or removal of nonsurgical wound dressing: Secondary | ICD-10-CM

## 2011-11-03 NOTE — Patient Instructions (Signed)
Pt wounds looked good and pt has a appt to see Dr Ezzard Standing on the July 18 at 1:30

## 2011-11-10 ENCOUNTER — Ambulatory Visit (INDEPENDENT_AMBULATORY_CARE_PROVIDER_SITE_OTHER): Payer: Medicare Other | Admitting: Pharmacist

## 2011-11-10 DIAGNOSIS — I82409 Acute embolism and thrombosis of unspecified deep veins of unspecified lower extremity: Secondary | ICD-10-CM

## 2011-11-15 ENCOUNTER — Other Ambulatory Visit (HOSPITAL_BASED_OUTPATIENT_CLINIC_OR_DEPARTMENT_OTHER): Payer: Medicare Other | Admitting: Lab

## 2011-11-15 ENCOUNTER — Other Ambulatory Visit: Payer: Self-pay | Admitting: Oncology

## 2011-11-15 ENCOUNTER — Other Ambulatory Visit: Payer: Self-pay | Admitting: *Deleted

## 2011-11-15 ENCOUNTER — Telehealth: Payer: Self-pay | Admitting: Oncology

## 2011-11-15 ENCOUNTER — Ambulatory Visit (HOSPITAL_BASED_OUTPATIENT_CLINIC_OR_DEPARTMENT_OTHER): Payer: Medicare Other | Admitting: Oncology

## 2011-11-15 VITALS — BP 112/55 | HR 69 | Temp 98.1°F | Ht 68.0 in | Wt 119.8 lb

## 2011-11-15 DIAGNOSIS — C7951 Secondary malignant neoplasm of bone: Secondary | ICD-10-CM

## 2011-11-15 DIAGNOSIS — C61 Malignant neoplasm of prostate: Secondary | ICD-10-CM

## 2011-11-15 DIAGNOSIS — C7952 Secondary malignant neoplasm of bone marrow: Secondary | ICD-10-CM

## 2011-11-15 DIAGNOSIS — E875 Hyperkalemia: Secondary | ICD-10-CM

## 2011-11-15 LAB — CBC WITH DIFFERENTIAL/PLATELET
Basophils Absolute: 0 10*3/uL (ref 0.0–0.1)
Eosinophils Absolute: 0.2 10*3/uL (ref 0.0–0.5)
HGB: 9.6 g/dL — ABNORMAL LOW (ref 13.0–17.1)
MCV: 95.4 fL (ref 79.3–98.0)
MONO%: 11.4 % (ref 0.0–14.0)
NEUT#: 5.8 10*3/uL (ref 1.5–6.5)
Platelets: 512 10*3/uL — ABNORMAL HIGH (ref 140–400)
RDW: 16.9 % — ABNORMAL HIGH (ref 11.0–14.6)

## 2011-11-15 MED ORDER — SODIUM POLYSTYRENE SULFONATE 15 GM/60ML PO SUSP: ORAL | Status: DC

## 2011-11-15 NOTE — Telephone Encounter (Signed)
Spoke with wife, instructed her to p/u kay-xalate  Tonight at [facility], kristin curcio e-scribed it. Take for 4 days and repeat lab in 1 week. Wife verbalizes understanding.

## 2011-11-15 NOTE — Progress Notes (Signed)
Hematology and Oncology Follow Up Visit  Roy Blackwell [id number] [birth year] 78 y.o. 11/15/2011 10:08 AM [name], MDJohn, Len [name], MD   Principle Diagnosis:  78 year old with hormone sensitive advanced prostate cancer diagnosed in 08/2011. His diagnosis made in 1992.   Prior Therapy: Radiation therapy in about 1992 (records unavailable to [place])  Current therapy: Hormone therapy in the form of Framagon under the care of [facility] He is also on Xgeva,   Interim History:  Roy Blackwell is an 78 year old male seen today for routine follow-up. We saw him in our office for the first time in May 2013. He is S/P vertebroplasty by dr. Corliss Skains in 09/2011 with pathology confirming prostate cancer. Patient tolerated it well and report much improved back pain. His moving a lot better and eating well with 5 lbs weight gain in 2 weeks. No loss of control of bowel of bladder and no neurological changes. Denies chest pain, shortness of breath, nausea, vomiting. Mild constipation and uses dietary modification with relief.   Medications: I have reviewed the patient's current medications. Current outpatient prescriptions:acetaminophen (TYLENOL) 500 MG tablet, Take 500 mg by mouth as needed. For pain., Disp: , Rfl: ;  clotrimazole-betamethasone (LOTRISONE) cream, Apply 1 application topically 2 (two) times daily as needed. For itching., Disp: , Rfl: ;  phytonadione (VITAMIN K) 5 MG tablet, Take 2.5 mg by mouth once. Save other 1/2 in case you need it in the future., Disp: , Rfl:  sulfamethoxazole-trimethoprim (BACTRIM DS,SEPTRA DS) 800-160 MG per tablet, Take 1 tablet by mouth 2 (two) times daily. , Disp: , Rfl: ;  Tamsulosin HCl (FLOMAX) 0.4 MG CAPS, Take 0.4 mg by mouth daily. , Disp: , Rfl: ;  traMADol (ULTRAM) 50 MG tablet, Take 50 mg by mouth every 6 (six) hours as needed. For pain., Disp: , Rfl: ;  warfarin (COUMADIN) 5 MG tablet, Take as directed by anticoagulation clinic, Disp: 30 tablet, Rfl: 3 DISCONTD:  furosemide (LASIX) 20 MG tablet, Take 20 mg by mouth daily.  , Disp: , Rfl:  Current facility-administered medications:pneumococcal 23 valent vaccine (PNU-IMMUNE) injection 0.5 mL, 0.5 mL, Intramuscular, Once, Corwin Levins, MD  Allergies: No Known Allergies  Past Medical History, Surgical history, Social history, and Family History were reviewed and updated.  Review of Systems: Constitutional:  Negative for fever, chills, night sweats, anorexia, weight loss, pain. Cardiovascular: no chest pain or dyspnea on exertion Respiratory: no cough, shortness of breath, or wheezing Neurological: no TIA or stroke symptoms Dermatological: negative ENT: negative Skin: Negative. Gastrointestinal: no abdominal pain, change in bowel habits, or black or bloody stools Genito-Urinary: no dysuria, trouble voiding, or hematuria Hematological and Lymphatic: negative Breast: negative for breast lumps Musculoskeletal: negative Remaining ROS negative.  Physical Exam: Blood pressure 112/55, pulse 69, temperature 98.1 F (36.7 C), temperature source Oral, height 5\' 8"  (1.727 m), weight 119 lb 12.8 oz (54.341 kg). ECOG: 1 General appearance: alert, cooperative and no distress Head: Normocephalic, without obvious abnormality, atraumatic Neck: no adenopathy, no carotid bruit, no JVD, supple, symmetrical, trachea midline and thyroid not enlarged, symmetric, no tenderness/mass/nodules Lymph nodes: Cervical, supraclavicular, and axillary nodes normal. Heart:regular rate and rhythm, S1, S2 normal, no murmur, click, rub or gallop Lung:chest clear, no wheezing, rales, normal symmetric air entry, no tachypnea, retractions or cyanosis Abdomen: soft, non-tender, without masses or organomegaly EXT:no erythema, induration, or nodules   Lab Results: Lab Results  Component Value Date   WBC 8.4 11/15/2011   HGB 9.6*  11/15/2011   HCT 29.4* 11/15/2011   MCV 95.4 11/15/2011   PLT 512* 11/15/2011     Chemistry        Component Value Date/Time   NA 140 10/17/2011 0833   K 4.9 10/17/2011 0833   CL 105 10/17/2011 0833   CO2 25 10/17/2011 0833   BUN 11 10/17/2011 0833   CREATININE 0.91 10/17/2011 0833      Component Value Date/Time   CALCIUM 9.1 10/17/2011 0833   ALKPHOS 170* 09/12/2011 1007   AST 15 09/12/2011 1007   ALT <8 09/12/2011 1007   BILITOT 0.2* 09/12/2011 1007    Results for Blackwell, Roy R (MRN [id number]) as of 10/04/2011 10:50  Ref. Range 08/31/2011 14:36  PSA Latest Range: 0.10-4.00 ng/mL >1460.00 Repeated and verified X2. (H)   R    Impression and Plan: This is an 78 year old gentleman with the following issues:  1. Advanced malignancy, had presented itself with bulky retroperitoneal lymph nodes as well as a lytic bony lesion at L4. He does have history of prostate cancer dating back to the early 1990s to late 1980s treated with radiation therapy, and an elevated PSA of 1460.  He is getting treated with Hormone therapy at this time without complications. I agree with that at this time and when he develops CRPC we will add different agents. I will continue to follow him as well.   2. Bony disease. On Xgeva now.   3. Pain: due to prostate caner is much better now.   6. Follow-up. 4 months.     Elberta Lachapelle 7/17/201310:08 AM

## 2011-11-15 NOTE — Telephone Encounter (Signed)
S/w wife re appt for 7/24. Lb added per 2nd 7/17 pof.

## 2011-11-15 NOTE — Telephone Encounter (Signed)
appts made and printed for pt aom °

## 2011-11-16 ENCOUNTER — Ambulatory Visit (INDEPENDENT_AMBULATORY_CARE_PROVIDER_SITE_OTHER): Payer: Medicare Other | Admitting: *Deleted

## 2011-11-16 ENCOUNTER — Ambulatory Visit (INDEPENDENT_AMBULATORY_CARE_PROVIDER_SITE_OTHER): Payer: Medicare Other | Admitting: Surgery

## 2011-11-16 VITALS — BP 120/70 | HR 66 | Temp 97.6°F | Resp 20 | Wt 114.4 lb

## 2011-11-16 DIAGNOSIS — I87039 Postthrombotic syndrome with ulcer and inflammation of unspecified lower extremity: Secondary | ICD-10-CM

## 2011-11-16 DIAGNOSIS — I82409 Acute embolism and thrombosis of unspecified deep veins of unspecified lower extremity: Secondary | ICD-10-CM

## 2011-11-16 LAB — COMPREHENSIVE METABOLIC PANEL
Albumin: 3.7 g/dL (ref 3.5–5.2)
Alkaline Phosphatase: 117 U/L (ref 39–117)
BUN: 14 mg/dL (ref 6–23)
Calcium: 8.8 mg/dL (ref 8.4–10.5)
Glucose, Bld: 84 mg/dL (ref 70–99)
Potassium: 6.3 mEq/L (ref 3.5–5.3)

## 2011-11-16 LAB — PSA: PSA: 623.9 ng/mL — ABNORMAL HIGH (ref <=4.00)

## 2011-11-16 NOTE — Progress Notes (Signed)
[facility]  Roy Kin, MD,  FACS [address]  [address] [place], [place]    [address] Phone:  [phone] FAX:  [phone]   Re:   Roy Blackwell DOB:   November 15, 1928 MRN:   [id number]  ASSESSMENT AND PLAN: 1.  Bilateral chronic venous stasis ulcers, medially above his ankles.  We are changing leg Unna boot and dressings every 2 - 3 weeks.  He grew out Pseudomonas on cultures in May 2012 and has been on Septra since then.  We talked about cutting the dose in half going forward - 1 tab per day.   According to Dr. Zachery Blackwell, every time he stopped the antibiotic, the wounds got worse.  So I will continue the Septra for now.   We will photo about every 2 to 3 months as long as they are Blackwell.  I'll see him back in 2 weeks for unna boot change.    The right wound is trying to heal.  2.  On coumadin.  Followed through the [place] coumadin clinic.    Last INR 1.4.  (11/10/2011).  3.  Hypertrophic cardiomyopathy.  Followed by Dr. Karie Blackwell. Roy Blackwell 08/15/2010.   4.  Weight loss (15 to 20 pounds in 9 months) and vague abdominal pain.  He is down 7 pounds from his last visit.  CT scan 08/31/2011 shows bulky intrathoracic and intraabdominal adenopathy.  He also has a pathologic fx of L4.    He had a IR bone tumor ablation 10/17/2011 by Dr. Corliss Blackwell.  Saw Dr. Clelia Blackwell 09/12/2011 - probable metastatic prostate ca.  5.  Metastaticf prostate cancer.  PSA >1460 (08/31/2011) -> 623 (11/15/2011).  On Roy Blackwell and Roy Blackwell. 6.  Hyperkalemia - K+ - 6.3 on 11/15/2011 - treated with polystyrene.  HISTORY OF PRESENT ILLNESS: Chief Complaint  Patient presents with  . Wound Check    Roy Blackwell is a 78 y.o. (DOB: Sep 08, 1928)  AA male who is a patient of Roy Barre, MD and comes to me today for follow up of bilateral chronic venous stasis ulcers.  He saw Dr. Jonny Blackwell who got a CT scan that shows  bulky intrathoracic and intraabdominal adenopathy.  He also has a pathologic fx of L4.   Roy Blackwell  was managed by the wound care center, then by Dr. Zachery Blackwell. With Dr. Annette Blackwell retirement, he asked if I would look after this patient.  He has had success with Una boots and compression to improve these ulcer.  This is a long term process, but certainly compared to photos from Aug 2012, there has been some improvement in ulcers on both lower extremities.   His major problem is now metastatic cancer, probably prostate. He still is having back pain, but better.  He has been treated for his back pathologic fracture.  He seeing Dr. Juliette Blackwell.     Today his problem in hyperkalemia.  Though he feels good.  Dr. Juliette Blackwell has given him some med to take.  PHYSICAL EXAM: BP 120/70  Pulse 66  Temp 97.6 F (36.4 C) (Temporal)  Resp 20  Wt 114 lb 6 oz (51.88 kg)  Lower extremities:  6.0 x 1.2 cm ulcer medial left leg (unchanged) and  2.0 X 0.8 cm ulcer medial right leg (this seems to be trying to heal)  Both wounds are clean.  We will plan to photograph these every 2 months.  DATA REVIEWED: Dr. Alver Fisher note.  Roy Kin, MD, FACS Office:  [phone]

## 2011-11-22 ENCOUNTER — Other Ambulatory Visit: Payer: Medicare Other | Admitting: Lab

## 2011-11-22 DIAGNOSIS — E875 Hyperkalemia: Secondary | ICD-10-CM

## 2011-11-22 LAB — BASIC METABOLIC PANEL
BUN: 13 mg/dL (ref 6–23)
CO2: 25 mEq/L (ref 19–32)
Creatinine, Ser: 0.72 mg/dL (ref 0.50–1.35)
Glucose, Bld: 83 mg/dL (ref 70–99)
Potassium: 4.8 mEq/L (ref 3.5–5.3)

## 2011-11-23 ENCOUNTER — Telehealth: Payer: Self-pay | Admitting: *Deleted

## 2011-11-23 ENCOUNTER — Ambulatory Visit (INDEPENDENT_AMBULATORY_CARE_PROVIDER_SITE_OTHER): Payer: Medicare Other | Admitting: *Deleted

## 2011-11-23 DIAGNOSIS — I82409 Acute embolism and thrombosis of unspecified deep veins of unspecified lower extremity: Secondary | ICD-10-CM

## 2011-11-23 LAB — POCT INR: INR: 1.1

## 2011-11-23 NOTE — Telephone Encounter (Signed)
Spoke to patient's wife to inform that K+ levels are normal.  Wife verbalized understanding.

## 2011-11-23 NOTE — Telephone Encounter (Signed)
Message copied by Sherre Poot on Thu Nov 23, 2011  3:09 PM ------      Message from: [name] R      Created: Thu Nov 23, 2011  2:21 PM       Please call patient and let him know potassium level is normal.

## 2011-11-30 ENCOUNTER — Ambulatory Visit (INDEPENDENT_AMBULATORY_CARE_PROVIDER_SITE_OTHER): Payer: Medicare Other | Admitting: *Deleted

## 2011-11-30 ENCOUNTER — Encounter (INDEPENDENT_AMBULATORY_CARE_PROVIDER_SITE_OTHER): Payer: Self-pay | Admitting: Surgery

## 2011-11-30 ENCOUNTER — Ambulatory Visit (INDEPENDENT_AMBULATORY_CARE_PROVIDER_SITE_OTHER): Payer: Medicare Other | Admitting: Surgery

## 2011-11-30 VITALS — BP 120/60 | HR 72 | Temp 97.6°F | Resp 18 | Ht 68.0 in | Wt 129.0 lb

## 2011-11-30 DIAGNOSIS — I82409 Acute embolism and thrombosis of unspecified deep veins of unspecified lower extremity: Secondary | ICD-10-CM

## 2011-11-30 DIAGNOSIS — I87039 Postthrombotic syndrome with ulcer and inflammation of unspecified lower extremity: Secondary | ICD-10-CM

## 2011-11-30 NOTE — Progress Notes (Signed)
[facility]  [name], MD,  FACS [address]  [address] [place], [place]    [address] Phone:  [phone] FAX:  [phone]   Re:   Roy Blackwell DOB:   Jun 08, 1928 MRN:   [id number]  ASSESSMENT AND PLAN: 1.  Bilateral chronic venous stasis ulcers, medially above his ankles.  We are changing leg Unna boot and dressings every 2 - 3 weeks.  He grew out Pseudomonas on cultures in May 2012 and has been on Septra since then.  We talked about cutting the dose in half going forward - 1 tab per day.   According to Dr. Zachery Dakins, every time he stopped the antibiotic, the wounds got worse.  So I will continue the Septra for now.   We will photo about every 2 to 3 months as long as they are stable.  I'll see him back in 2 weeks for unna boot change.    The right wound is trying to heal.  2.  On coumadin.  Followed through the [facility] coumadin clinic.    Last INR 1.2.  (11/30/2011).  3.  Hypertrophic cardiomyopathy.  Followed by Dr. Karie Schwalbe. [name] 08/15/2010.   4.  Weight loss (15 to 20 pounds in 9 months) and vague abdominal pain.    His weight is up 15 pounds since last visit.  CT scan 08/31/2011 shows bulky intrathoracic and intraabdominal adenopathy.  He also has a pathologic fx of L4.    He had a IR bone tumor ablation 10/17/2011 by Dr. Corliss Skains.  Saw Dr. Clelia Croft 09/12/2011 - probable metastatic prostate ca.  5.  Metastaticf prostate cancer.  PSA >1460 (08/31/2011) -> 623 (11/15/2011).  On Framagon and Xgeva.  HISTORY OF PRESENT ILLNESS: Chief Complaint  Patient presents with  . Follow-up    Roy Blackwell is a 78 y.o. (DOB: [birth year])  AA male who is a patient of [name], MD and comes to me today for follow up of bilateral chronic venous stasis ulcers.  He saw Dr. Jonny Ruiz who got a CT scan that shows  bulky intrathoracic and intraabdominal adenopathy.  He also has a pathologic fx of L4.   Roy Blackwell was managed by the wound care center, then by Dr. Zachery Dakins. With  Dr. Annette Stable retirement, he asked if I would look after this patient.  He has had success with Una boots and compression to improve these ulcer.  This is a long term process, but certainly compared to photos from Aug 2012, there has been some improvement in ulcers on both lower extremities.   His major problem is now metastatic cancer, probably prostate. He still is having back pain, but better.  He has been treated for his back pathologic fracture.  He seeing Dr. Juliette Alcide.     He is doing much better.  His weight is up.  He is off his cane.  And his legs look better.  PHYSICAL EXAM: BP 120/60  Pulse 72  Temp 97.6 F (36.4 C) (Temporal)  Resp 18  Ht 5\' 8"  (1.727 m)  Wt 129 lb (58.514 kg)  BMI 19.61 kg/m2  Lower extremities:  5.5 x 2.0 cm ulcer medial left leg (unchanged) and  1.0 x 1.0 cm ulcer medial right leg (this seems to be trying to heal)  Both wounds are clean.  We will plan to photograph these every 2 months.  We photographed the wounds today.  DATA REVIEWED: Dr. Alver Fisher note.  [name], MD, FACS Office:  [phone]

## 2011-12-07 ENCOUNTER — Ambulatory Visit (INDEPENDENT_AMBULATORY_CARE_PROVIDER_SITE_OTHER): Payer: Medicare Other | Admitting: *Deleted

## 2011-12-07 ENCOUNTER — Other Ambulatory Visit: Payer: Self-pay | Admitting: *Deleted

## 2011-12-07 DIAGNOSIS — I82409 Acute embolism and thrombosis of unspecified deep veins of unspecified lower extremity: Secondary | ICD-10-CM

## 2011-12-07 MED ORDER — WARFARIN SODIUM 5 MG PO TABS: ORAL_TABLET | ORAL | Status: DC

## 2011-12-11 ENCOUNTER — Other Ambulatory Visit (INDEPENDENT_AMBULATORY_CARE_PROVIDER_SITE_OTHER): Payer: Self-pay | Admitting: General Surgery

## 2011-12-13 ENCOUNTER — Encounter (INDEPENDENT_AMBULATORY_CARE_PROVIDER_SITE_OTHER): Payer: Self-pay | Admitting: Surgery

## 2011-12-13 ENCOUNTER — Ambulatory Visit (INDEPENDENT_AMBULATORY_CARE_PROVIDER_SITE_OTHER): Payer: Medicare Other | Admitting: Surgery

## 2011-12-13 VITALS — BP 118/70 | HR 72 | Temp 98.0°F | Resp 18 | Wt 128.0 lb

## 2011-12-13 DIAGNOSIS — I87039 Postthrombotic syndrome with ulcer and inflammation of unspecified lower extremity: Secondary | ICD-10-CM

## 2011-12-13 NOTE — Progress Notes (Signed)
[facility]  [name], MD,  FACS [address] [place]  [address] [place], [place]    [address] Phone:  [phone] FAX:  [phone]   Re:   Roy Blackwell DOB:   01-May-1929 MRN:   [id number]  ASSESSMENT AND PLAN: 1.  Bilateral chronic venous stasis ulcers, medially above his ankles.  We are changing leg Unna boot and dressings every 2 - 3 weeks.  He grew out Pseudomonas on cultures in May 2012 and has been on Septra since then.  We talked about cutting the dose in half going forward - 1 tab per day.   According to Dr. Zachery Dakins, every time he stopped the antibiotic, the wounds got worse.  So I will continue the Septra for now.   We will photo about every 3 months as long as they are stable.  I'll see him back in 2 weeks for unna boot change.    The right wound is trying to heal, but just is not quite there.  2.  On coumadin.  Followed through the [facility].    Last INR 1.2.  (11/30/2011).  3.  Hypertrophic cardiomyopathy.  Followed by Dr. Karie Schwalbe. Roy Blackwell 08/15/2010.   4.  Weight loss (15 to 20 pounds in 9 months) and vague abdominal pain.    His weight is up 15 pounds since last visit.  CT scan 08/31/2011 shows bulky intrathoracic and intraabdominal adenopathy.  He also has a pathologic fx of L4.    He had a IR bone tumor ablation 10/17/2011 by Dr. Corliss Skains.  Saw Dr. Clelia Croft 09/12/2011 - probable metastatic prostate ca.  5.  Metastaticf prostate cancer.  PSA >1460 (08/31/2011) -> 623 (11/15/2011).  On Framagon and Xgeva.  HISTORY OF PRESENT ILLNESS: Chief Complaint  Patient presents with  . Follow-up    Roy Blackwell is a 78 y.o. (DOB: November 02, 1928)  AA male who is a patient of [name], MD and comes to me today for follow up of bilateral chronic venous stasis ulcers.  He saw Dr. Jonny Ruiz who got a CT scan that shows  bulky intrathoracic and intraabdominal adenopathy.  He also has a pathologic fx of L4.   Roy Blackwell was managed by the wound care center, then  by Dr. Zachery Dakins. With Dr. Annette Stable retirement, he asked if I would look after this patient.  He has had success with Una boots and compression to improve these ulcer.  This is a long term process, but certainly compared to photos from Aug 2012, there has been some improvement in ulcers on both lower extremities.   His major problem is now metastatic cancer, probably prostate. He still is having back pain, but better.  He has been treated for his back pathologic fracture.  He seeing Dr. Juliette Alcide.     He continues to do better.  His weight is up.  He says that his wife will not let him get out of the house much.  PHYSICAL EXAM: BP 118/70  Pulse 72  Temp 98 F (36.7 C) (Temporal)  Resp 18  Wt 128 lb (58.06 kg)  Lower extremities:  6.0 x 1.5 cm ulcer medial left leg (unchanged) and  1.0 x 1.0 cm ulcer medial right leg (this seems to be trying to heal)  Both wounds are clean.    DATA REVIEWED: Dr. Alver Fisher note.  [name], MD, FACS Office:  [phone]

## 2011-12-21 ENCOUNTER — Ambulatory Visit (INDEPENDENT_AMBULATORY_CARE_PROVIDER_SITE_OTHER): Payer: Medicare Other | Admitting: Pharmacist

## 2011-12-21 DIAGNOSIS — I82409 Acute embolism and thrombosis of unspecified deep veins of unspecified lower extremity: Secondary | ICD-10-CM

## 2011-12-29 ENCOUNTER — Encounter (INDEPENDENT_AMBULATORY_CARE_PROVIDER_SITE_OTHER): Payer: Self-pay | Admitting: Surgery

## 2011-12-29 ENCOUNTER — Ambulatory Visit (INDEPENDENT_AMBULATORY_CARE_PROVIDER_SITE_OTHER): Payer: Medicare Other | Admitting: Surgery

## 2011-12-29 VITALS — BP 122/80 | HR 64 | Temp 97.7°F | Resp 18 | Ht 68.0 in | Wt 132.4 lb

## 2011-12-29 DIAGNOSIS — I87039 Postthrombotic syndrome with ulcer and inflammation of unspecified lower extremity: Secondary | ICD-10-CM

## 2011-12-29 NOTE — Progress Notes (Signed)
[facility]  [name], MD,  FACS [address] [place]  [address] [place], [place]    [address] Phone:  [phone] FAX:  [phone]   Re:   Roy Blackwell DOB:   Sep 08, 1928 MRN:   [id number]  ASSESSMENT AND PLAN: 1.  Bilateral chronic venous stasis ulcers, medially above his ankles.  We are changing leg Unna boot and dressings every 2 - 3 weeks.  He grew out Pseudomonas on cultures in May 2012 and has been on Septra since then.  We talked about cutting the dose in half going forward - 1 tab per day.   According to Dr. Zachery Dakins, every time he stopped the antibiotic, the wounds got worse.  So I will continue the Septra for now.   We will photo about every 3 months as long as they are stable.  I'll see him back in 2 weeks for unna boot change.    The right wound is trying to heal, but just is not quite there.  He is in good spirits.  2.  On coumadin.  Followed through the [facility].    Last INR 2.0.  (12/21/2011).  3.  Hypertrophic cardiomyopathy.  Followed by Dr. Karie Schwalbe. [name] 08/15/2010.   4.  Weight loss (15 to 20 pounds in 9 months) and vague abdominal pain.    His weight is up 15 pounds.  CT scan 08/31/2011 shows bulky intrathoracic and intraabdominal adenopathy.  He also has a pathologic fx of L4.    He had a IR bone tumor ablation 10/17/2011 by Dr. Corliss Skains.  Saw Dr. Clelia Croft 09/12/2011 - probable metastatic prostate ca.  5.  Metastatic prostate cancer.  PSA >1460 (08/31/2011) -> 623 (11/15/2011).  On Framagon and Xgeva.  HISTORY OF PRESENT ILLNESS: Chief Complaint  Patient presents with  . Wound Check    lower legs bilaterlly    Roy Blackwell is a 78 y.o. (DOB: 04-11-1929)  AA male who is a patient of [name], MD and comes to me today for follow up of bilateral chronic venous stasis ulcers.   He is doing well today.  His appetite is good.  Wound history:  Mr. Weldy was managed by the wound care center, then by Dr. Zachery Dakins. With Dr.  Annette Stable retirement, he asked if I would look after this patient.  He has had success with Una boots and compression to improve these ulcer.  This is a long term process, but certainly compared to photos from Aug 2012, there has been some improvement in ulcers on both lower extremities.  PHYSICAL EXAM: BP 122/80  Pulse 64  Temp 97.7 F (36.5 C) (Oral)  Resp 18  Ht 5\' 8"  (1.727 m)  Wt 132 lb 6.4 oz (60.056 kg)  BMI 20.13 kg/m2  Lower extremities:  5.0 x 1.2 cm ulcer medial left leg (unchanged) and  1.2 x 1.2 cm ulcer medial right leg (this seems to be trying to heal)  Both wounds are clean.    DATA REVIEWED: None new  [name], MD, FACS Office:  [phone]

## 2012-01-10 ENCOUNTER — Encounter (INDEPENDENT_AMBULATORY_CARE_PROVIDER_SITE_OTHER): Payer: Self-pay | Admitting: Surgery

## 2012-01-10 ENCOUNTER — Ambulatory Visit (INDEPENDENT_AMBULATORY_CARE_PROVIDER_SITE_OTHER): Payer: Medicare Other | Admitting: Surgery

## 2012-01-10 VITALS — BP 120/70 | HR 78 | Temp 97.9°F | Resp 18 | Ht 68.0 in | Wt 133.0 lb

## 2012-01-10 DIAGNOSIS — I87039 Postthrombotic syndrome with ulcer and inflammation of unspecified lower extremity: Secondary | ICD-10-CM

## 2012-01-10 NOTE — Progress Notes (Signed)
[facility]  [name], MD,  FACS [address] [place]  [address] [place], [place]    [address] Phone:  [phone] FAX:  [phone]   Re:   Roy Blackwell DOB:   Sep 28, 1928 MRN:   [id number]  ASSESSMENT AND PLAN: 1.  Bilateral chronic venous stasis ulcers, medially above his ankles.  We are changing leg Unna boot and dressings every 2 - 3 weeks.  He grew out Pseudomonas on cultures in May 2012 and has been on Septra since then.  We talked about cutting the dose in half going forward - 1 tab per day.   According to Dr. Zachery Dakins, every time he stopped the antibiotic, the wounds got worse.  So I will continue the Septra for now.   We will photo about every 3 months as long as they are stable.  I'll see him back in 2 weeks for unna boot change.    We will try something new - use gauze on the right and una boot on the left.  2.  On coumadin.  Followed through the [facility].    Last INR 2.0.  (12/21/2011).  3.  Hypertrophic cardiomyopathy.  Followed by Dr. Karie Schwalbe. [name] 08/15/2010.   4.  Metastatic prostate cancer.  PSA >1460 (08/31/2011) -> 623 (11/15/2011).  On Framagon and Xgeva.  Weight loss, but has regained weight back, and vague abdominal pain which has resolved.  CT scan 08/31/2011 shows bulky intrathoracic and intraabdominal adenopathy.  He also has a pathologic fx of L4.    He had a IR bone tumor ablation 10/17/2011 by Dr. Corliss Skains.  Saw Dr. Clelia Croft 09/12/2011 - metastatic prostate ca.  HISTORY OF PRESENT ILLNESS: Chief Complaint  Patient presents with  . Wound Check    Roy Blackwell is a 78 y.o. (DOB: 1928-06-18)  AA male who is a patient of [name], MD and comes to me today for follow up of bilateral chronic venous stasis ulcers.   He is doing well today.  He says he occasionally feels hot for no reason.  He has no fever and his appetite is good.  Wound history:  Mr. Marotto was managed by the wound care center, then by Dr. Zachery Dakins. With Dr.  Annette Stable retirement, he asked if I would look after this patient.  He has had success with Una boots and compression to improve these ulcer.  This is a long term process, but certainly compared to photos from Aug 2012, there has been some improvement in ulcers on both lower extremities.  PHYSICAL EXAM: BP 120/70  Pulse 78  Temp 97.9 F (36.6 C) (Temporal)  Resp 18  Ht 5\' 8"  (1.727 m)  Wt 133 lb (60.328 kg)  BMI 20.22 kg/m2  Lower extremities:  5.0 x 1.2 cm ulcer medial left leg (slightly better) and  1.2 x 1.2 cm ulcer medial right leg (this seems to be going backward)  Both wounds are clean.    The wounds are unchanged - will try something new - use gauze on the right and una boot on the left.  DATA REVIEWED: None new  [name], MD, FACS Office:  [phone]

## 2012-01-11 ENCOUNTER — Ambulatory Visit (INDEPENDENT_AMBULATORY_CARE_PROVIDER_SITE_OTHER): Payer: Medicare Other | Admitting: *Deleted

## 2012-01-11 DIAGNOSIS — I82409 Acute embolism and thrombosis of unspecified deep veins of unspecified lower extremity: Secondary | ICD-10-CM

## 2012-01-26 ENCOUNTER — Encounter (INDEPENDENT_AMBULATORY_CARE_PROVIDER_SITE_OTHER): Payer: Self-pay | Admitting: Surgery

## 2012-01-26 ENCOUNTER — Ambulatory Visit (INDEPENDENT_AMBULATORY_CARE_PROVIDER_SITE_OTHER): Payer: Medicare Other | Admitting: Surgery

## 2012-01-26 VITALS — BP 122/60 | HR 76 | Temp 99.0°F | Resp 18 | Ht 68.0 in | Wt 140.0 lb

## 2012-01-26 DIAGNOSIS — I87039 Postthrombotic syndrome with ulcer and inflammation of unspecified lower extremity: Secondary | ICD-10-CM

## 2012-01-26 NOTE — Progress Notes (Signed)
[facility]  [name], MD,  FACS [address] [place]  [address] [place], [place]    [address] Phone:  [phone] FAX:  [phone]   Re:   Roy Blackwell DOB:   10-11-1928 MRN:   [id number]  ASSESSMENT AND PLAN: 1.  Bilateral chronic venous stasis ulcers, medially above his ankles.  We are changing leg Unna boot and dressings every 2 - 3 weeks.  He grew out Pseudomonas on cultures in May 2012 and has been on Septra since then.  We talked about cutting the dose in half going forward - 1 tab per day.   According to Dr. Zachery Dakins, every time he stopped the antibiotic, the wounds got worse.  So I will continue the Septra for now.   We will photo about every 3 months as long as they are stable.  I'll see him back in 2 weeks for unna boot change.    We are trying something new - using gauze on the right leg and una boot on the left.  2.  On coumadin.  Followed through the [facility].    Last INR 2.7.  (01/11/2012).  3.  Hypertrophic cardiomyopathy.  Followed by Dr. Karie Schwalbe. [name] 08/15/2010.   4.  Metastatic prostate cancer.  PSA >1460 (08/31/2011) -> 623 (11/15/2011).  On Framagon and Xgeva.  CT scan 08/31/2011 shows bulky intrathoracic and intraabdominal adenopathy.  He also has a pathologic fx of L4.    He had a IR bone tumor ablation 10/17/2011 by Dr. Corliss Skains.  Saw Dr. Clelia Croft 09/12/2011 - metastatic prostate ca.  HISTORY OF PRESENT ILLNESS: Chief Complaint  Patient presents with  . Wound Check    reck leg wounds    Roy Blackwell is a 78 y.o. (DOB: 01-Feb-1929)  AA male who is a patient of [name], MD and comes to me today for follow up of bilateral chronic venous stasis ulcers.   He is doing well today.  His appetite is good.  No new concerns.  Wound history:  Mr. Litteken was managed by the wound care center, then by Dr. Zachery Dakins. With Dr. Annette Stable retirement, he asked if I would look after this patient.  He has had success with Una boots and  compression to improve these ulcer.  This is a long term process, but certainly compared to photos from Aug 2012, there has been some improvement in ulcers on both lower extremities.  PHYSICAL EXAM: BP 122/60  Pulse 76  Temp 99 F (37.2 C) (Temporal)  Resp 18  Ht 5\' 8"  (1.727 m)  Wt 140 lb (63.504 kg)  BMI 21.29 kg/m2  Lower extremities:  4.0 x 1.5 cm ulcer medial left leg (slightly better) and  1.5 x 2.0 cm ulcer medial right leg (this seems to be going backward)  Both wounds are clean.    We are putting an [place] boot on the left for now. DATA REVIEWED: None new  [name], MD, FACS Office:  [phone]

## 2012-02-08 ENCOUNTER — Ambulatory Visit (INDEPENDENT_AMBULATORY_CARE_PROVIDER_SITE_OTHER): Payer: Medicare Other | Admitting: *Deleted

## 2012-02-08 DIAGNOSIS — I82409 Acute embolism and thrombosis of unspecified deep veins of unspecified lower extremity: Secondary | ICD-10-CM

## 2012-02-14 ENCOUNTER — Encounter (INDEPENDENT_AMBULATORY_CARE_PROVIDER_SITE_OTHER): Payer: Self-pay | Admitting: Surgery

## 2012-02-14 ENCOUNTER — Ambulatory Visit (INDEPENDENT_AMBULATORY_CARE_PROVIDER_SITE_OTHER): Payer: Medicare Other | Admitting: Surgery

## 2012-02-14 VITALS — BP 118/60 | HR 72 | Temp 98.9°F | Resp 16 | Ht 68.0 in | Wt 138.2 lb

## 2012-02-14 DIAGNOSIS — I87039 Postthrombotic syndrome with ulcer and inflammation of unspecified lower extremity: Secondary | ICD-10-CM

## 2012-02-14 NOTE — Progress Notes (Signed)
[facility]  [name], MD,  FACS [address] [place]  [address] [place], [place]    [address] Phone:  [phone] FAX:  [phone]   Re:   Roy Blackwell DOB:   May 14, 1928 MRN:   [id number]  ASSESSMENT AND PLAN: 1.  Bilateral chronic venous stasis ulcers, medially above his ankles.  We are changing leg Unna boot and dressings every 2 - 3 weeks.  He grew out Pseudomonas on cultures in May 2012 and has been on Septra since then.  We talked about cutting the dose in half going forward - 1 tab per day.   According to Dr. Zachery Dakins, every time he stopped the antibiotic, the wounds got worse.  So I will continue the Septra for now.   We will photo about every 3 months as long as they are stable.  I'll see him back in 2 weeks for unna boot change.    His right ankle ulcer has become larger on local dressing changes only, so we will go back to Unna boots on both legs.  2.  On coumadin.  Followed through the [facility].    Last INR 2.1.  (02/08/2012).  3.  Hypertrophic cardiomyopathy.  Followed by Dr. Karie Schwalbe. Roy Blackwell 08/15/2010.   4.  Metastatic prostate cancer.  PSA >1460 (08/31/2011) -> 623 (11/15/2011).  On Framagon and Xgeva.  CT scan 08/31/2011 shows bulky intrathoracic and intraabdominal adenopathy.  He also has a pathologic fx of L4.    He had a IR bone tumor ablation 10/17/2011 by Dr. Corliss Skains.  Saw Dr. Clelia Croft 09/12/2011 - metastatic prostate ca.  HISTORY OF PRESENT ILLNESS: Chief Complaint  Patient presents with  . Wound Check    reck leg wounds    Roy Blackwell is a 78 y.o. (DOB: [birth year])  AA male who is a patient of [name], MD and comes to me today for follow up of bilateral chronic venous stasis ulcers.    He is in good spirits today.  Appetite is good and he is holding his weight steady.  Wound history:  Roy Blackwell was managed by the wound care center, then by Dr. Zachery Dakins. With Dr. Annette Stable retirement (Dec 2012), he asked if I would  look after this patient.  He has had success with Una boots and compression to improve these ulcer.  This is a long term process, but certainly compared to photos from Aug 2012, there has been some improvement in ulcers on both lower extremities.  PHYSICAL EXAM: BP 118/60  Pulse 72  Temp 98.9 F (37.2 C) (Temporal)  Resp 16  Ht 5\' 8"  (1.727 m)  Wt 138 lb 3.2 oz (62.687 kg)  BMI 21.01 kg/m2  Lower extremities:  4.0 x 1.3 cm ulcer medial left leg and  3.1 x 1.6 cm ulcer medial right leg (this seems to be going backward - so we will go back to the Unna boots)  Both wounds are clean.    We are putting an Unna boots on both lower extremities.  DATA REVIEWED: None new  [name], MD, FACS Office:  [phone]

## 2012-02-29 ENCOUNTER — Ambulatory Visit (INDEPENDENT_AMBULATORY_CARE_PROVIDER_SITE_OTHER): Payer: Medicare Other | Admitting: *Deleted

## 2012-02-29 DIAGNOSIS — I82409 Acute embolism and thrombosis of unspecified deep veins of unspecified lower extremity: Secondary | ICD-10-CM

## 2012-02-29 LAB — POCT INR: INR: 3.2

## 2012-03-01 ENCOUNTER — Ambulatory Visit (INDEPENDENT_AMBULATORY_CARE_PROVIDER_SITE_OTHER): Payer: Medicare Other | Admitting: Surgery

## 2012-03-01 ENCOUNTER — Encounter (INDEPENDENT_AMBULATORY_CARE_PROVIDER_SITE_OTHER): Payer: Self-pay | Admitting: Surgery

## 2012-03-01 VITALS — BP 120/60 | HR 78 | Temp 99.1°F | Resp 18 | Wt 140.5 lb

## 2012-03-01 DIAGNOSIS — I87039 Postthrombotic syndrome with ulcer and inflammation of unspecified lower extremity: Secondary | ICD-10-CM

## 2012-03-01 NOTE — Progress Notes (Signed)
[facility]  Roy Kin, MD,  FACS [address] [place]  [address] [place], [place]    [address] Phone:  [phone] FAX:  [phone]   Re:   Roy Blackwell DOB:   01-04-1929 MRN:   [id number]  ASSESSMENT AND PLAN: 1.  Bilateral chronic venous stasis ulcers, medially above his ankles.  We are changing leg Unna boot and dressings every 2 - 3 weeks.  He grew out Pseudomonas on cultures in May 2012 and has been on Septra since then.  We talked about cutting the dose in half going forward - 1 tab per day.   According to Dr. Zachery Blackwell, every time he stopped the antibiotic, the wounds got worse.  So I will continue the Septra for now.   We will photo about every 3 months as long as they are Blackwell.  I'll see him back in 2 weeks for unna boot change.    Both his wounds, which did seem to be getting better - have gone backwards over the last 2 months. Continue local wound care.  2.  On coumadin.  Followed through the [facility].    Last INR 3.2.  (02/29/2012).  3.  Hypertrophic cardiomyopathy.  Followed by Dr. Karie Blackwell. Roy Blackwell 08/15/2010.   4.  Metastatic prostate cancer.  PSA >1460 (08/31/2011) -> 623 (11/15/2011).  On Framagon and Xgeva.  CT scan 08/31/2011 shows bulky intrathoracic and intraabdominal adenopathy.  He also has a pathologic fx of L4.    He had a IR bone tumor ablation 10/17/2011 by Dr. Corliss Blackwell.  Saw Dr. Clelia Blackwell 11/15/2011 - metastatic prostate ca.  HISTORY OF PRESENT ILLNESS: Chief Complaint  Patient presents with  . Wound Check    Roy Blackwell is a 78 y.o. (DOB: 05/29/1928)  AA male who is a patient of Roy Barre, MD and comes to me today for follow up of bilateral chronic venous stasis ulcers.    He is doing well.  His appetite is good.  He has not complaints.  Wound history:  Roy Blackwell was managed by the wound care center, then by Dr. Zachery Blackwell. With Dr. Annette Blackwell retirement (Dec 2012), he asked if I would look after this patient.  He has  had success with Una boots and compression to improve these ulcer.  This is a long term process, but certainly compared to photos from Aug 2012, there has been some improvement in ulcers on both lower extremities.  PHYSICAL EXAM: BP 120/60  Pulse 78  Temp 99.1 F (37.3 C) (Oral)  Resp 18  Wt 140 lb 8 oz (63.73 kg)  Lower extremities:  5.0 x 2.2 cm ulcer medial left leg and  3.2 x 1.8 cm ulcer medial right leg (this seems to be going backward - so we will go back to the Unna boots)  Both wounds are clean, but there is no ingrowth of new skin.  We are putting an Unna boots on both lower extremities.  DATA REVIEWED: None new  Roy Kin, MD, FACS Office:  [phone]

## 2012-03-14 ENCOUNTER — Ambulatory Visit (INDEPENDENT_AMBULATORY_CARE_PROVIDER_SITE_OTHER): Payer: Medicare Other

## 2012-03-14 DIAGNOSIS — I82409 Acute embolism and thrombosis of unspecified deep veins of unspecified lower extremity: Secondary | ICD-10-CM

## 2012-03-18 ENCOUNTER — Telehealth: Payer: Self-pay | Admitting: *Deleted

## 2012-03-18 ENCOUNTER — Telehealth: Payer: Self-pay | Admitting: Oncology

## 2012-03-18 ENCOUNTER — Ambulatory Visit (HOSPITAL_BASED_OUTPATIENT_CLINIC_OR_DEPARTMENT_OTHER): Payer: Medicare Other | Admitting: Oncology

## 2012-03-18 ENCOUNTER — Other Ambulatory Visit: Payer: Self-pay | Admitting: *Deleted

## 2012-03-18 ENCOUNTER — Other Ambulatory Visit (HOSPITAL_BASED_OUTPATIENT_CLINIC_OR_DEPARTMENT_OTHER): Payer: Medicare Other

## 2012-03-18 ENCOUNTER — Encounter: Payer: Self-pay | Admitting: Oncology

## 2012-03-18 VITALS — BP 114/54 | HR 70 | Temp 97.3°F | Resp 20 | Ht 68.0 in | Wt 140.2 lb

## 2012-03-18 DIAGNOSIS — C61 Malignant neoplasm of prostate: Secondary | ICD-10-CM

## 2012-03-18 DIAGNOSIS — G893 Neoplasm related pain (acute) (chronic): Secondary | ICD-10-CM

## 2012-03-18 DIAGNOSIS — C7952 Secondary malignant neoplasm of bone marrow: Secondary | ICD-10-CM

## 2012-03-18 DIAGNOSIS — E875 Hyperkalemia: Secondary | ICD-10-CM

## 2012-03-18 LAB — CBC WITH DIFFERENTIAL/PLATELET
Basophils Absolute: 0 10*3/uL (ref 0.0–0.1)
Eosinophils Absolute: 0.2 10*3/uL (ref 0.0–0.5)
HGB: 9.7 g/dL — ABNORMAL LOW (ref 13.0–17.1)
MCV: 96 fL (ref 79.3–98.0)
MONO#: 1 10*3/uL — ABNORMAL HIGH (ref 0.1–0.9)
NEUT#: 4.5 10*3/uL (ref 1.5–6.5)
RDW: 14.3 % (ref 11.0–14.6)
lymph#: 1.8 10*3/uL (ref 0.9–3.3)

## 2012-03-18 LAB — COMPREHENSIVE METABOLIC PANEL (CC13)
ALT: 7 U/L (ref 0–55)
AST: 13 U/L (ref 5–34)
Albumin: 3.5 g/dL (ref 3.5–5.0)
Alkaline Phosphatase: 57 U/L (ref 40–150)
BUN: 25 mg/dL (ref 7.0–26.0)
CO2: 22 meq/L (ref 22–29)
Calcium: 9.9 mg/dL (ref 8.4–10.4)
Chloride: 111 meq/L — ABNORMAL HIGH (ref 98–107)
Creatinine: 1.6 mg/dL — ABNORMAL HIGH (ref 0.7–1.3)
Glucose: 137 mg/dL — ABNORMAL HIGH (ref 70–99)
Potassium: 6.2 meq/L (ref 3.5–5.1)
Sodium: 137 meq/L (ref 136–145)
Total Bilirubin: 0.21 mg/dL (ref 0.20–1.20)
Total Protein: 6.7 g/dL (ref 6.4–8.3)

## 2012-03-18 MED ORDER — SODIUM POLYSTYRENE SULFONATE 15 GM/60ML PO SUSP: ORAL | Status: DC

## 2012-03-18 NOTE — Telephone Encounter (Signed)
Called pt at home and spoke with wife.  Informed wife re:  Pt's potassium level 6.2 today.   Instructed wife to pick up Kayexalate med from [facility] and have pt start taking  60 ml po daily x 4 days as per Belenda Cruise, NP.   Informed wife that a scheduler will contact pt with date and time for lab rechecked in 1 week.    Lab results today faxed to pt's primary Dr. Excell Seltzer for further advice to pt.

## 2012-03-18 NOTE — Telephone Encounter (Signed)
gv pt appt schedule for March 2014. °

## 2012-03-18 NOTE — Telephone Encounter (Signed)
S/w pt re appt for 11/25.

## 2012-03-18 NOTE — Progress Notes (Signed)
Hematology and Oncology Follow Up Visit  Roy Blackwell [id number] [birth year] 78 y.o. 03/18/2012 1:04 PM Roy Blackwell, MDJohn, Roy [name], MD   Principle Diagnosis:  78year old with hormone sensitive advanced prostate cancer diagnosed in 08/2011. His diagnosis made in 1992.   Prior Therapy: Radiation therapy in about 1992 (records unavailable to [place])  Current therapy: Hormone therapy in the form of Framagon under the care of [facility]. Patient states he was started on Casodex around early November 2013. e is also on Xgeva at [facility].  Interim History:  Roy Blackwell is an 78year old male seen today for routine follow-up. We saw him in our office for the first time in May 2013. He is S/P vertebroplasty by dr. Corliss Skains in 09/2011 with pathology confirming prostate cancer. Patient tolerated it well and report much improved back pain. His moving a lot better and eating well with continued weight gain. No loss of control of bowel of bladder and no neurological changes. Denies chest pain, shortness of breath, nausea, vomiting. Mild constipation and uses dietary modification with relief. Has una boots on bilaterally for wounds to his lower extremities.   Medications: I have reviewed the patient's current medications. Current outpatient prescriptions:acetaminophen (TYLENOL) 500 MG tablet, Take 500 mg by mouth as needed. For pain., Disp: , Rfl: ;  Ascorbic Acid (VITAMIN C PO), Take by mouth daily., Disp: , Rfl: ;  bicalutamide (CASODEX) 50 MG tablet, Take 50 mg by mouth daily., Disp: , Rfl: ;  Cholecalciferol (VITAMIN D PO), Take by mouth daily., Disp: , Rfl:  sulfamethoxazole-trimethoprim (BACTRIM DS,SEPTRA DS) 800-160 MG per tablet, Take 1 tablet by mouth 2 (two) times daily. , Disp: , Rfl: ;  warfarin (COUMADIN) 5 MG tablet, Take as directed by anticoagulation clinic, Disp: 60 tablet, Rfl: 3;  [DISCONTINUED] furosemide (LASIX) 20 MG tablet, Take 20 mg by mouth daily.  , Disp: , Rfl:  Current  facility-administered medications:pneumococcal 23 valent vaccine (PNU-IMMUNE) injection 0.5 mL, 0.5 mL, Intramuscular, Once, Corwin Levins, MD  Allergies: No Known Allergies  Past Medical History, Surgical history, Social history, and Family History were reviewed and updated.  Review of Systems: Constitutional:  Negative for fever, chills, night sweats, anorexia, weight loss, pain. Cardiovascular: no chest pain or dyspnea on exertion Respiratory: no cough, shortness of breath, or wheezing Neurological: no TIA or stroke symptoms Dermatological: negative ENT: negative Skin: Negative. Gastrointestinal: no abdominal pain, change in bowel habits, or black or bloody stools Genito-Urinary: no dysuria, trouble voiding, or hematuria Hematological and Lymphatic: negative Breast: negative for breast lumps Musculoskeletal: negative Remaining ROS negative.  Physical Exam: Blood pressure 114/54, pulse 70, temperature 97.3 F (36.3 C), temperature source Oral, resp. rate 20, height 5\' 8"  (1.727 m), weight 140 lb 3.2 oz (63.594 kg). ECOG: 1 General appearance: alert, cooperative and no distress Head: Normocephalic, without obvious abnormality, atraumatic Neck: no adenopathy, no carotid bruit, no JVD, supple, symmetrical, trachea midline and thyroid not enlarged, symmetric, no tenderness/mass/nodules Lymph nodes: Cervical, supraclavicular, and axillary nodes normal. Heart:regular rate and rhythm, S1, S2 normal, no murmur, click, rub or gallop Lung:chest clear, no wheezing, rales, normal symmetric air entry, no tachypnea, retractions or cyanosis Abdomen: soft, non-tender, without masses or organomegaly EXT:no erythema, induration, or nodules   Lab Results: Lab Results  Component Value Date   WBC 7.6 03/18/2012   HGB 9.7* 03/18/2012   HCT 29.2* 03/18/2012   MCV 96.0 03/18/2012   PLT 404* 03/18/2012     Chemistry  Component Value Date/Time   NA 137 03/18/2012 0958   NA 144 11/22/2011  1236   K 6.2 No visable hemolysis* 03/18/2012 0958   K 4.8 11/22/2011 1236   CL 111* 03/18/2012 0958   CL 109 11/22/2011 1236   CO2 22 03/18/2012 0958   CO2 25 11/22/2011 1236   BUN 25.0 03/18/2012 0958   BUN 13 11/22/2011 1236   CREATININE 1.6* 03/18/2012 0958   CREATININE 0.72 11/22/2011 1236      Component Value Date/Time   CALCIUM 9.9 03/18/2012 0958   CALCIUM 9.2 11/22/2011 1236   ALKPHOS 57 03/18/2012 0958   ALKPHOS 117 11/15/2011 0937   AST 13 03/18/2012 0958   AST 13 11/15/2011 0937   ALT 7 03/18/2012 0958   ALT 8 11/15/2011 0937   BILITOT 0.21 03/18/2012 0958   BILITOT 0.2* 11/15/2011 0937      Results for Yeley, Roy Blackwell (MRN [id number]) as of 03/18/2012 13:04  Ref. Range 08/31/2011 14:36 11/15/2011 09:37  PSA Latest Range: <=4.00 ng/mL >1460.00 Repeated and verified X2. (H) 623.90 (H)      Impression and Plan: This is an 78 year old gentleman with the following issues:  1. Advanced malignancy, had presented itself with bulky retroperitoneal lymph nodes as well as a lytic bony lesion at L4. He does have history of prostate cancer dating back to the early 1990s to late 1980s treated with radiation therapy, and an elevated PSA of 1460. He is getting treated with Deborra Medina without complications. Casodex was recently added. I agree with that at this time and when he develops CRPC we will add different agents. I will continue to follow him as well.   2. Bony disease. On Xgeva at [facility].   3. Pain: due to prostate caner is much better now.   4. Hyperkalemia. Etiology unclear. Review of chart shows and elevated K+ in July 2013 that was treated with Kayexalate. I have reordered this today with a repeat BMET next week. Recommend that he follow-up with PCP to see if further work-up is warranted.  5. Follow-up. 4 months.     [name] 11/18/20131:04 PM

## 2012-03-19 ENCOUNTER — Telehealth (INDEPENDENT_AMBULATORY_CARE_PROVIDER_SITE_OTHER): Payer: Self-pay

## 2012-03-19 NOTE — Telephone Encounter (Signed)
Patient will be here at 9:30 instead of 12:30 03/20/12 pt aware

## 2012-03-20 ENCOUNTER — Ambulatory Visit (INDEPENDENT_AMBULATORY_CARE_PROVIDER_SITE_OTHER): Payer: Medicare Other | Admitting: Surgery

## 2012-03-20 ENCOUNTER — Encounter (INDEPENDENT_AMBULATORY_CARE_PROVIDER_SITE_OTHER): Payer: Medicare Other | Admitting: Surgery

## 2012-03-20 ENCOUNTER — Telehealth (INDEPENDENT_AMBULATORY_CARE_PROVIDER_SITE_OTHER): Payer: Self-pay | Admitting: General Surgery

## 2012-03-20 ENCOUNTER — Encounter (INDEPENDENT_AMBULATORY_CARE_PROVIDER_SITE_OTHER): Payer: Self-pay | Admitting: Surgery

## 2012-03-20 VITALS — BP 126/70 | HR 60 | Temp 98.0°F | Resp 18 | Ht 68.0 in | Wt 137.2 lb

## 2012-03-20 DIAGNOSIS — I87039 Postthrombotic syndrome with ulcer and inflammation of unspecified lower extremity: Secondary | ICD-10-CM

## 2012-03-20 NOTE — Telephone Encounter (Signed)
Pt called and is confused about the Septra ordered today by Dr. Ezzard Standing.  Pt states they discussed altering the frequency to QD from BID, but pt thought Dr. Ezzard Standing was going to leave him on the BID for now.  The Rx written was for QD, according to the pharmacist at [facility].  Will clarify order with Dr. Ezzard Standing and update pt and pharmacist on 03/21/12.

## 2012-03-20 NOTE — Progress Notes (Addendum)
[facility]  [name], MD,  FACS [address]  [address] [place], [place]    [address] Phone:  [phone] FAX:  [phone]   Re:   Roy Blackwell DOB:   May 17, 1928 MRN:   [id number]  ASSESSMENT AND PLAN: 1.  Bilateral chronic venous stasis ulcers, medially above his ankles.  We are changing leg Unna boot and dressings every 2 - 3 weeks.  He grew out Pseudomonas on cultures in May 2012 and has been on Septra since then.  We talked about cutting the dose in half going forward - 1 tab per day.   According to Dr. Zachery Dakins, every time he stopped the antibiotic, the wounds got worse.  So I will continue the Septra for now. [I wrote another prescription for 60 tabs with 3 refills]  We will photo about every 3 months as long as they are stable.  [took photos today]  I'll see him back in 2 weeks for unna boot change.    Both his wounds now seem to have stalled as far as getting better. Continue local wound care.  2.  On coumadin.  Followed through the [facility].    Last INR 2.4.  (03/14/2012).  3.  Hypertrophic cardiomyopathy.  Followed by Dr. Karie Schwalbe. [name] 08/15/2010.   4.  Metastatic prostate cancer.  PSA >1460 (08/31/2011) -> 623 (11/15/2011) -> 348 (03/18/2012).  Was on [place].  CT scan 08/31/2011 shows bulky intrathoracic and intraabdominal adenopathy.  He also has a pathologic fx of L4.    He had a IR bone tumor ablation 10/17/2011 by Dr. Corliss Skains.  Saw Dr. Clelia Croft (saw [name] last) 03/18/2012 - metastatic prostate ca.  5.  Hyperkalemia - on Kayexalate.  HISTORY OF PRESENT ILLNESS: Chief Complaint  Patient presents with  . Follow-up    wd legs una boots    Roy Blackwell is a 78 y.o. (DOB: Oct 22, 1928)  AA male who is a patient of [name], MD (he actually has been assigned a new PCP, but he cannot remember the name) and comes to me today for follow up of bilateral chronic venous stasis ulcers.    He is doing well.  His  appetite is good.  He has not complaints.  Wound history:  Roy Blackwell was managed by the [facility] wound care center, then by Dr. Zachery Dakins. With Dr. Annette Stable retirement (Dec 2012), he asked if I would look after this patient.  He has had success with Una boots and compression to improve these ulcer.  This is a long term process, but certainly compared to photos from Aug 2012, there has been some improvement in ulcers on both lower extremities.  PHYSICAL EXAM: BP 126/70  Pulse 60  Temp 98 F (36.7 C) (Oral)  Resp 18  Ht 5\' 8"  (1.727 m)  Wt 137 lb 3.2 oz (62.234 kg)  BMI 20.86 kg/m2  Lower extremities:  5.5 x 2.2 cm ulcer medial left leg and  3.2 x 2.0 cm ulcer medial right leg.  Both wounds are clean, but there is no ingrowth of new skin.  While earlier this year, things looks better, they seemed to have stalled now.  We are putting an Unna boots on both lower extremities.  DATA REVIEWED: Elevated K+ - 6.2 and lower PSA.  [name], MD, FACS Office:  [phone]

## 2012-03-21 ENCOUNTER — Telehealth: Payer: Self-pay | Admitting: Internal Medicine

## 2012-03-21 ENCOUNTER — Telehealth (INDEPENDENT_AMBULATORY_CARE_PROVIDER_SITE_OTHER): Payer: Self-pay | Admitting: General Surgery

## 2012-03-21 NOTE — Telephone Encounter (Signed)
Spoke with Dr. Ezzard Standing to clarify Septra orders.  Will be okay to take it BID.  Called the pharmacist at [facility] and the pt to update.

## 2012-03-21 NOTE — Telephone Encounter (Signed)
Received lab document per oncology, with elev K 6.2, tx per onc.  Suggested PCP f/u.  OK to call pt to offer appt

## 2012-03-21 NOTE — Telephone Encounter (Signed)
Appt Tues Nov 26.

## 2012-03-25 ENCOUNTER — Other Ambulatory Visit (HOSPITAL_BASED_OUTPATIENT_CLINIC_OR_DEPARTMENT_OTHER): Payer: Medicare Other

## 2012-03-25 DIAGNOSIS — C61 Malignant neoplasm of prostate: Secondary | ICD-10-CM

## 2012-03-25 DIAGNOSIS — E875 Hyperkalemia: Secondary | ICD-10-CM

## 2012-03-25 LAB — BASIC METABOLIC PANEL (CC13)
BUN: 20 mg/dL (ref 7.0–26.0)
Chloride: 110 mEq/L — ABNORMAL HIGH (ref 98–107)
Creatinine: 1.2 mg/dL (ref 0.7–1.3)
Glucose: 93 mg/dl (ref 70–99)
Potassium: 4.4 mEq/L (ref 3.5–5.1)

## 2012-03-26 ENCOUNTER — Ambulatory Visit: Payer: Medicare Other | Admitting: Internal Medicine

## 2012-04-01 ENCOUNTER — Encounter: Payer: Self-pay | Admitting: Internal Medicine

## 2012-04-01 ENCOUNTER — Ambulatory Visit (INDEPENDENT_AMBULATORY_CARE_PROVIDER_SITE_OTHER): Payer: Medicare Other | Admitting: Internal Medicine

## 2012-04-01 VITALS — BP 130/60 | HR 65 | Temp 98.4°F | Ht 68.0 in | Wt 143.2 lb

## 2012-04-01 DIAGNOSIS — Z0000 Encounter for general adult medical examination without abnormal findings: Secondary | ICD-10-CM

## 2012-04-01 NOTE — Assessment & Plan Note (Signed)

## 2012-04-01 NOTE — Patient Instructions (Addendum)
Please call [phone] if you would want to change your coumadin clinic to [facility] since the copay would be less expensive You are given the results of your recent potassium - normal Please return if you change your mind about the flu shot No further blood work needed today You are otherwise up to date with prevention measures Please continue your efforts at being more active, low cholesterol diet, and weight control. Please return in 6 months, or sooner if needed

## 2012-04-01 NOTE — Progress Notes (Signed)
Subjective:    Patient ID: Roy Blackwell, male    DOB: January 29, 1929, 78 y.o.   MRN: [id number]  HPI Here for wellness and f/u;  Overall doing ok;  Pt denies CP, worsening SOB, DOE, wheezing, orthopnea, PND, worsening LE edema, palpitations, dizziness or syncope.  Pt denies neurological change such as new Headache, facial or extremity weakness.  Pt denies polydipsia, polyuria, or low sugar symptoms. Pt states overall good compliance with treatment and medications, good tolerability, and trying to follow lower cholesterol diet.  Pt denies worsening depressive symptoms, suicidal ideation or panic. No fever, wt loss, night sweats, loss of appetite, or other constitutional symptoms.  Pt states good ability with ADL's, low fall risk, home safety reviewed and adequate, no significant changes in hearing or vision, and occasionally active with exercise.  Did have recent elev K 6.2 with f/u normal at 4.4.  Pt without acute complaints Past Medical History  Diagnosis Date  . Venous insufficiency     His diagnosis this is a note on Roy Blackwell he was diagnosed as having chronic thrombophlebitis when he was just in back in the 50s and has seen numerous surgeons Dr. Nedra Hai Dr. 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 Dr. Genelle Bal he continued his Coumadin management but he has no history of atri  . Hypertrophic cardiomyopathy   . Prostate cancer 08/27/2011  . Hypertension 08/27/2011  . Pulmonary nodule 08/27/2011    2008  . Hyperlipidemia 08/27/2011  . Inguinal lymphadenopathy 08/31/2011    Bulky bilat 2009 CT  . Warfarin anticoagulation 08/31/2011  . Hydronephrosis 09/01/2011  . CKD (chronic kidney disease) 09/01/2011  . Abdominal lymphadenopathy 09/01/2011   No past surgical history on file.  reports that he has been smoking.  He has never used smokeless tobacco. He reports that he does not drink alcohol or use illicit  drugs. family history is not on file. No Known Allergies Current Outpatient Prescriptions on File Prior to Visit  Medication Sig Dispense Refill  . acetaminophen (TYLENOL) 500 MG tablet Take 500 mg by mouth as needed. For pain.      . Ascorbic Acid (VITAMIN C PO) Take by mouth daily.      . bicalutamide (CASODEX) 50 MG tablet Take 50 mg by mouth daily.      . Cholecalciferol (VITAMIN D PO) Take by mouth daily.      Marland Kitchen warfarin (COUMADIN) 5 MG tablet Take as directed by anticoagulation clinic  60 tablet  3  . sodium polystyrene (KAYEXALATE) 15 GM/60ML suspension Take 60 ml by mouth daily x 4 days.  240 mL  0  . sulfamethoxazole-trimethoprim (BACTRIM DS,SEPTRA DS) 800-160 MG per tablet Take 1 tablet by mouth 2 (two) times daily.       . [DISCONTINUED] furosemide (LASIX) 20 MG tablet Take 20 mg by mouth daily.         Current Facility-Administered Medications on File Prior to Visit  Medication Dose Route Frequency Provider Last Rate Last Dose  . pneumococcal 23 valent vaccine (PNU-IMMUNE) injection 0.5 mL  0.5 mL Intramuscular Once Corwin Levins, MD       Review of Systems  Review of Systems  Constitutional: Negative for diaphoresis, activity change, appetite change and unexpected weight change.  HENT: Negative for hearing loss, ear pain, facial swelling, mouth sores and neck stiffness.   Eyes: Negative for pain, redness and visual disturbance.  Respiratory: Negative for shortness of breath and wheezing.   Cardiovascular: Negative for chest pain and palpitations.  Gastrointestinal: Negative for diarrhea, blood in stool, abdominal distention and rectal pain.  Genitourinary: Negative for hematuria, flank pain and decreased urine volume.  Musculoskeletal: Negative for myalgias and joint swelling.  Skin: Negative for color change and wound.  Neurological: Negative for syncope and numbness.  Hematological: Negative for adenopathy.  Psychiatric/Behavioral: Negative for hallucinations,  self-injury, decreased concentration and agitation.     Objective:   Physical Exam BP 130/60  Pulse 65  Temp 98.4 F (36.9 C) (Oral)  Ht 5\' 8"  (1.727 m)  Wt 143 lb 4 oz (64.978 kg)  BMI 21.78 kg/m2  SpO2 97% Physical Exam  VS noted Constitutional: Pt is oriented to person, place, and time. Appears well-developed and well-nourished.  HENT:  Head: Normocephalic and atraumatic.  Right Ear: External ear normal.  Left Ear: External ear normal.  Nose: Nose normal.  Mouth/Throat: Oropharynx is clear and moist.  Eyes: Conjunctivae and EOM are normal. Pupils are equal, round, and reactive to light.  Neck: Normal range of motion. Neck supple. No JVD present. No tracheal deviation present.  Cardiovascular: Normal rate, regular rhythm, normal heart sounds and intact distal pulses.   Pulmonary/Chest: Effort normal and breath sounds normal.  Abdominal: Soft. Bowel sounds are normal. There is no tenderness.  Musculoskeletal: Normal range of motion. Exhibits no edema.  Lymphadenopathy:  Has no cervical adenopathy.  Neurological: Pt is alert and oriented to person, place, and time. Pt has normal reflexes. No cranial nerve deficit.  Skin: Skin is warm and dry. No rash noted.  Psychiatric:  Has  normal mood and affect. Behavior is normal.     Assessment & Plan:

## 2012-04-04 ENCOUNTER — Encounter (INDEPENDENT_AMBULATORY_CARE_PROVIDER_SITE_OTHER): Payer: Medicare Other | Admitting: Surgery

## 2012-04-05 ENCOUNTER — Encounter (INDEPENDENT_AMBULATORY_CARE_PROVIDER_SITE_OTHER): Payer: Self-pay | Admitting: Surgery

## 2012-04-05 ENCOUNTER — Encounter (INDEPENDENT_AMBULATORY_CARE_PROVIDER_SITE_OTHER): Payer: Medicare Other | Admitting: Surgery

## 2012-04-05 ENCOUNTER — Ambulatory Visit (INDEPENDENT_AMBULATORY_CARE_PROVIDER_SITE_OTHER): Payer: Medicare Other | Admitting: Surgery

## 2012-04-05 VITALS — BP 138/72 | HR 76 | Temp 98.0°F | Resp 18 | Ht 67.0 in | Wt 142.0 lb

## 2012-04-05 DIAGNOSIS — I87039 Postthrombotic syndrome with ulcer and inflammation of unspecified lower extremity: Secondary | ICD-10-CM

## 2012-04-05 NOTE — Progress Notes (Signed)
[facility]  Roy Kin, MD,  FACS [address] [place]  [address] [place], [place]    [address] Phone:  [phone] FAX:  [phone]   Re:   Roy Blackwell DOB:   1928-10-21 MRN:   [id number]  ASSESSMENT AND PLAN: 1.  Bilateral chronic venous stasis ulcers, medially above his ankles.  We are changing leg Unna boot and dressings every 2 - 3 weeks.  He grew out Pseudomonas on cultures in May 2012 and has been on Septra since then.  We talked about cutting the dose in half going forward - 1 tab per day.   According to Roy Blackwell, every time he stopped the antibiotic, the wounds got worse.  So I will continue the Septra for now. [I wrote another prescription for 60 tabs with 3 refills]  We will photo about every 3 months as long as they are Blackwell.  Both his wounds now seem to have stalled as far as getting better.    I'll see him back in 2 weeks for unna boot change.     2.  On coumadin.  Followed through the  [facility] clinic.    Last INR 2.4.  (03/14/2012).  3.  Hypertrophic cardiomyopathy.  Followed by Roy Blackwell. Roy Blackwell 08/15/2010.   4.  Metastatic prostate cancer.  PSA >1460 (08/31/2011) -> 623 (11/15/2011) -> 348 (03/18/2012).  Has seen Dr. Mena Blackwell.  Was on [place].  CT scan 08/31/2011 shows bulky intrathoracic and intraabdominal adenopathy.  He also has a pathologic fx of L4.    He had a IR bone tumor ablation 10/17/2011 by Dr. Corliss Blackwell.  Saw Dr. Clelia Blackwell (saw Roy Blackwell last) 03/18/2012 - metastatic prostate ca.  HISTORY OF PRESENT ILLNESS: Chief Complaint  Patient presents with  . Wound Check    un boots   Roy Blackwell is a 78 y.o. (DOB: 1928/06/28)  AA male who is a patient of Roy Barre, MD (he actually has been assigned a new PCP, but he cannot remember the name) and comes to me today for follow up of bilateral chronic venous stasis ulcers.    He is doing well.  His appetite is good.  He has not complaints.  He had some hyperkalemia  about one month ago, which has resolved.  Wound history:  Roy Blackwell was managed by the [facility] wound care center, then by Roy Blackwell. With Dr. Annette Blackwell retirement (Dec 2012), he asked if I would look after this patient.  He has had success with Una boots and compression to improve these ulcer.  This is a long term process, but certainly compared to photos from Aug 2012, there has been some improvement in ulcers on both lower extremities.  PHYSICAL EXAM: BP 138/72  Pulse 76  Temp 98 F (36.7 C)  Resp 18  Ht 5\' 7"  (1.702 m)  Wt 142 lb (64.411 kg)  BMI 22.24 kg/m2  Lower extremities:  5.2 x 2.0 cm ulcer medial left leg and  3.5 x 1.5 cm ulcer medial right leg.  Both wounds are clean, but there is no ingrowth of new skin.  While earlier this year, things looks better, they seemed to have stalled now.  We are putting an Unna boots on both lower extremities.  DATA REVIEWED: No new labs.  Roy Kin, MD, FACS Office:  [phone]

## 2012-04-11 ENCOUNTER — Ambulatory Visit (INDEPENDENT_AMBULATORY_CARE_PROVIDER_SITE_OTHER): Payer: Medicare Other | Admitting: *Deleted

## 2012-04-11 DIAGNOSIS — I82409 Acute embolism and thrombosis of unspecified deep veins of unspecified lower extremity: Secondary | ICD-10-CM

## 2012-04-17 ENCOUNTER — Ambulatory Visit (INDEPENDENT_AMBULATORY_CARE_PROVIDER_SITE_OTHER): Payer: Medicare Other | Admitting: Surgery

## 2012-04-17 VITALS — BP 126/70 | HR 66 | Temp 97.8°F | Resp 18 | Ht 68.0 in

## 2012-04-17 DIAGNOSIS — I87039 Postthrombotic syndrome with ulcer and inflammation of unspecified lower extremity: Secondary | ICD-10-CM

## 2012-04-17 NOTE — Progress Notes (Signed)
[facility]  Roy Kin, MD,  FACS [address] [place]  [address] [place], [place]    [address] Phone:  [phone] FAX:  [phone]   Re:   Roy Blackwell DOB:   23-Jan-1929 MRN:   [id number]  ASSESSMENT AND PLAN: 1.  Bilateral chronic venous stasis ulcers, medially above his ankles.  We are changing leg Unna boot and dressings every 2 - 3 weeks.  He grew out Pseudomonas on cultures in May 2012 and has been on Septra since then.  We talked about cutting the dose in half going forward - 1 tab per day.   According to Dr. Zachery Dakins, every time he stopped the antibiotic, the wounds got worse.  So I will continue the Septra for now. [I wrote another prescription for 60 tabs with 3 refills]  We will photo about every 3 months as long as they are stable.  Both his wounds now seem to have stalled as far as getting better.    I'll see him back in 2 weeks for unna boot change.     2.  On coumadin.  Followed through the [facility].    Last INR 2.4.  (03/14/2012).  3.  Hypertrophic cardiomyopathy.  Followed by Dr. Karie Schwalbe. [name] 08/15/2010.   4.  Metastatic prostate cancer.  PSA >1460 (08/31/2011) -> 623 (11/15/2011) -> 348 (03/18/2012).  Has seen Dr. Mena Goes who has put him on Casodex  CT scan 08/31/2011 shows bulky intrathoracic and intraabdominal adenopathy.  He also has a pathologic fx of L4.    He had a IR bone tumor ablation 10/17/2011 by Dr. Corliss Skains.  Saw Dr. Clelia Croft (saw [name] last) 03/18/2012 - metastatic prostate ca.  HISTORY OF PRESENT ILLNESS: Chief Complaint  Patient presents with  . Wound Check    lower legs   Roy Blackwell is a 78 y.o. (DOB: Dec 27, 1928)  AA male who is a patient of [name], MD (he actually has been assigned a new PCP, but he cannot remember the name) and comes to me today for follow up of bilateral chronic venous stasis ulcers.    He is doing well.  His appetite is good.  He has not complaints.  He is in good  spirits..  Wound history:  Mr. Marken was managed by the [facility] wound care center, then by Dr. Zachery Dakins. With Dr. Annette Stable retirement (Dec 2012), he asked if I would look after this patient.  He has had success with Una boots and compression to improve these ulcer.  This is a long term process, but certainly compared to photos from Aug 2012, there has been some improvement in ulcers on both lower extremities.  PHYSICAL EXAM: BP 126/70  Pulse 66  Temp 97.8 F (36.6 C) (Temporal)  Resp 18  Ht 5\' 8"  (1.727 m)  Lower extremities:  5.2 x 2.0 cm ulcer medial left leg (unchanged from last visit)  3.3 x 1.8 cm ulcer medial right leg.  Both wounds are clean, but there is no ingrowth of new skin.  While earlier this year, things looks better, they seemed to have stalled now.  We are putting an Unna boots on both lower extremities.  DATA REVIEWED: No new labs.  Roy Kin, MD, FACS Office:  [phone]

## 2012-04-26 ENCOUNTER — Ambulatory Visit (INDEPENDENT_AMBULATORY_CARE_PROVIDER_SITE_OTHER): Payer: Medicare Other | Admitting: *Deleted

## 2012-04-26 DIAGNOSIS — I82409 Acute embolism and thrombosis of unspecified deep veins of unspecified lower extremity: Secondary | ICD-10-CM

## 2012-04-26 LAB — POCT INR: INR: 2.5

## 2012-05-08 ENCOUNTER — Ambulatory Visit (INDEPENDENT_AMBULATORY_CARE_PROVIDER_SITE_OTHER): Payer: Medicare Other | Admitting: Surgery

## 2012-05-08 ENCOUNTER — Encounter (INDEPENDENT_AMBULATORY_CARE_PROVIDER_SITE_OTHER): Payer: Self-pay | Admitting: Surgery

## 2012-05-08 VITALS — BP 134/76 | HR 78 | Temp 98.7°F | Resp 18 | Ht 68.0 in | Wt 144.2 lb

## 2012-05-08 DIAGNOSIS — I87039 Postthrombotic syndrome with ulcer and inflammation of unspecified lower extremity: Secondary | ICD-10-CM

## 2012-05-08 NOTE — Progress Notes (Signed)
[facility]  [name], MD,  FACS [address] [place]  [address] [place], [place]    [address] Phone:  [phone] FAX:  [phone]   Re:   Roy Blackwell DOB:   April 28, 1929 MRN:   [id number]  ASSESSMENT AND PLAN: 1.  Bilateral chronic venous stasis ulcers, medially above his ankles. L>R  He grew out Pseudomonas on cultures in May 2012 and has been on Septra since then.  We talked about cutting the dose in half going forward - 1 tab per day.   According to Dr. Zachery Blackwell, every time he stopped the antibiotic, the wounds got worse.  So I will continue the Septra for now.   We will photo about every 3 months as long as they are stable.  Both his wounds now seem to have stalled as far as getting better.    The plan going forward (starting Jan 2014) is to have a nurse visit every 2 weeks alternating with seeing me.  So I will see him every month.  The nurses do no need to worry about measuring the wound, unless there has been a dramatic change.     2.  On coumadin.  Followed through the [facility].    Last INR 2.5.  (04/26/2012).  3.  Hypertrophic cardiomyopathy.  Followed by Dr. Karie Blackwell. Roy Blackwell 08/15/2010.   4.  Metastatic prostate cancer.  PSA >1460 (08/31/2011) -> 623 (11/15/2011) -> 348 (03/18/2012).  Has seen Dr. Mena Blackwell who has put him on Casodex  CT scan 08/31/2011 shows bulky intrathoracic and intraabdominal adenopathy.  He also has a pathologic fx of L4.    He had a IR bone tumor ablation 10/17/2011 by Dr. Corliss Blackwell.  Saw Dr. Clelia Blackwell (saw Roy Blackwell last) 03/18/2012 - metastatic prostate ca.  HISTORY OF PRESENT ILLNESS: Chief Complaint  Patient presents with  . Wound Check    legs bilaterally   Roy Blackwell is a 79 y.o. (DOB: 1928-08-21)  AA male who is a patient of Roy Barre, MD (he actually has been assigned a new PCP, but he cannot remember the name) and comes to me today for follow up of bilateral chronic venous stasis ulcers.    He is doing  well.  His appetite is good.  He has not complaints.  He is in good spirits. We talked about changing the follow up pattern.  Wound history:  Roy Blackwell was managed by the [facility] wound care center, then by Dr. Zachery Blackwell. With Dr. Annette Stable retirement (Dec 2012), he asked if I would look after this patient.  He has had success with Una boots and compression to improve these ulcer.  This is a long term process, but certainly compared to photos from Aug 2012, there has been some improvement in ulcers on both lower extremities.  PHYSICAL EXAM: BP 134/76  Pulse 78  Temp 98.7 F (37.1 C)  Resp 18  Ht 5\' 8"  (1.727 m)  Wt 144 lb 3.2 oz (65.409 kg)  BMI 21.93 kg/m2  Lower extremities:  5.0 x 2.0 cm ulcer medial left leg (unchanged from last visit)  3.2 x 1.6 cm ulcer medial right leg.  Both wounds are clean, but there is no ingrowth of new skin.   We are putting an Unna boots on both lower extremities.  DATA REVIEWED: No new labs.  [name], MD, FACS Office:  [phone]

## 2012-05-22 ENCOUNTER — Ambulatory Visit (INDEPENDENT_AMBULATORY_CARE_PROVIDER_SITE_OTHER): Payer: Medicare Other

## 2012-05-22 ENCOUNTER — Encounter (INDEPENDENT_AMBULATORY_CARE_PROVIDER_SITE_OTHER): Payer: Self-pay

## 2012-05-22 VITALS — BP 128/82 | HR 72 | Temp 97.0°F | Resp 18

## 2012-05-22 DIAGNOSIS — Z0000 Encounter for general adult medical examination without abnormal findings: Secondary | ICD-10-CM

## 2012-05-22 NOTE — Progress Notes (Signed)
Bilateral lower leg ulcers  RT leg 3 x 1.5 cm  ; Left Leg 5 x2 cm No changes noted  No discomfort voiced. Dressing changed as ordered by Dr. Ezzard Standing. Povidone Iodine applied to ulcers ,covered with 4x4"s , Una boots wraps applied, 52m Coban applied. Patient tolerated well.  He  will return in 2 wks to see Dr. Ezzard Standing 06/07/12 @ 10:50a

## 2012-05-24 ENCOUNTER — Ambulatory Visit (INDEPENDENT_AMBULATORY_CARE_PROVIDER_SITE_OTHER): Payer: Medicare Other

## 2012-05-24 ENCOUNTER — Ambulatory Visit (INDEPENDENT_AMBULATORY_CARE_PROVIDER_SITE_OTHER): Payer: Medicare Other | Admitting: Cardiology

## 2012-05-24 ENCOUNTER — Encounter: Payer: Self-pay | Admitting: Cardiology

## 2012-05-24 VITALS — BP 108/50 | HR 58 | Ht 68.0 in | Wt 145.0 lb

## 2012-05-24 DIAGNOSIS — I87039 Postthrombotic syndrome with ulcer and inflammation of unspecified lower extremity: Secondary | ICD-10-CM

## 2012-05-24 DIAGNOSIS — I82409 Acute embolism and thrombosis of unspecified deep veins of unspecified lower extremity: Secondary | ICD-10-CM

## 2012-05-24 DIAGNOSIS — I10 Essential (primary) hypertension: Secondary | ICD-10-CM

## 2012-05-24 DIAGNOSIS — I421 Obstructive hypertrophic cardiomyopathy: Secondary | ICD-10-CM

## 2012-05-24 DIAGNOSIS — C61 Malignant neoplasm of prostate: Secondary | ICD-10-CM

## 2012-05-24 MED ORDER — WARFARIN SODIUM 5 MG PO TABS: ORAL_TABLET | ORAL | Status: AC

## 2012-05-24 NOTE — Patient Instructions (Signed)
Your physician recommends that you schedule a follow-up appointment as needed.   Your physician recommends that you continue on your current medications as directed. Please refer to the Current Medication list given to you today.  

## 2012-05-24 NOTE — Assessment & Plan Note (Signed)
Followed by [facility].  Remains on long term warfarin.  Will defer care to Dr.[name]

## 2012-05-24 NOTE — Progress Notes (Signed)
HPI:  Maan returns today in a followup visit. Overall, he is been followed closely for prostate cancer. He recently of hyperkalemia, and this was readdressed more laboratory work Dr. Jonny Ruiz. He's had no cardiac symptoms, although he is followed for chronic venous insufficiency by the general surgeons and and they been having Unna boots for quite some time.  Mendel continues to smoke and does not have plans to quit.    Current Outpatient Prescriptions  Medication Sig Dispense Refill  . acetaminophen (TYLENOL) 500 MG tablet Take 500 mg by mouth as needed. For pain.      . Ascorbic Acid (VITAMIN C PO) Take by mouth daily.      . bicalutamide (CASODEX) 50 MG tablet Take 50 mg by mouth daily.      . Cholecalciferol (VITAMIN D PO) Take by mouth daily.      Marland Kitchen sulfamethoxazole-trimethoprim (BACTRIM DS,SEPTRA DS) 800-160 MG per tablet Take 1 tablet by mouth 2 (two) times daily.       Marland Kitchen warfarin (COUMADIN) 5 MG tablet Take as directed by anticoagulation clinic  60 tablet  3  . [DISCONTINUED] furosemide (LASIX) 20 MG tablet Take 20 mg by mouth daily.         Current Facility-Administered Medications  Medication Dose Route Frequency Provider Last Rate Last Dose  . pneumococcal 23 valent vaccine (PNU-IMMUNE) injection 0.5 mL  0.5 mL Intramuscular Once Corwin Levins, MD        No Known Allergies  Past Medical History  Diagnosis Date  . Venous insufficiency     His diagnosis this is a note on Ermine Spofford he was diagnosed as having chronic thrombophlebitis when he was just in back in the 50s and has seen numerous surgeons Dr. Nedra Hai Dr. 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 Dr. Genelle Bal he continued his Coumadin management but he has no history of atri  . Hypertrophic cardiomyopathy   . Prostate cancer 08/27/2011  . Hypertension 08/27/2011  . Pulmonary nodule 08/27/2011    2008  . Hyperlipidemia  08/27/2011  . Inguinal lymphadenopathy 08/31/2011    Bulky bilat 2009 CT  . Warfarin anticoagulation 08/31/2011  . Hydronephrosis 09/01/2011  . CKD (chronic kidney disease) 09/01/2011  . Abdominal lymphadenopathy 09/01/2011    No past surgical history on file.  No family history on file.  History   Social History  . Marital Status: Married    Spouse Name: N/A    Number of Children: N/A  . Years of Education: 14   Occupational History  . retired from [employer]   .     Social History Main Topics  . Smoking status: Current Every Day Smoker -- 0.5 packs/day  . Smokeless tobacco: Never Used  . Alcohol Use: No  . Drug Use: No  . Sexually Active: Not on file   Other Topics Concern  . Not on file   Social History Narrative  . No narrative on file    ROS: Please see the HPI.  All other systems reviewed and negative.  PHYSICAL EXAM:  BP 108/50  Pulse 58  Ht 5\' 8"  (1.727 m)  Wt 145 lb (65.772 kg)  BMI 22.05 kg/m2  SpO2 90%  General: Well developed, well nourished, in no acute distress. Head:  Normocephalic and atraumatic. Neck: no JVD Lungs: Clear to auscultation and percussion. Heart: Normal S1 and S2. 1-2/6 SEM.  No DM.   Pulses: Pulses normal in all 4 extremities. Extremities:Chronic venous changes with both legs in Unna boots.  Skin discoloration.   Neurologic: Alert and oriented x 3.  EKG:  SB.  LVH with repolarization change.  No new changes.  Lead position has changed from before (hence, possible anterolateral ischemia listed on tracing)  ASSESSMENT AND PLAN:

## 2012-05-24 NOTE — Assessment & Plan Note (Signed)
Followed by Oncology

## 2012-05-24 NOTE — Assessment & Plan Note (Signed)
Has no obstruction, but has carried this diagnosis.  Currently mainly LVH by echo.  No further cardiac evaluation warranted in the absence of new symptoms.

## 2012-05-28 ENCOUNTER — Telehealth: Payer: Self-pay | Admitting: General Practice

## 2012-05-28 NOTE — Telephone Encounter (Signed)
Spoke with patient in regards to coming to [facility] to have INR checked.  Patient states that he is happy at [facility] and he wants to stay there for now.

## 2012-06-06 ENCOUNTER — Ambulatory Visit (INDEPENDENT_AMBULATORY_CARE_PROVIDER_SITE_OTHER): Payer: Medicare Other | Admitting: Surgery

## 2012-06-06 ENCOUNTER — Encounter (INDEPENDENT_AMBULATORY_CARE_PROVIDER_SITE_OTHER): Payer: Self-pay | Admitting: Surgery

## 2012-06-06 VITALS — BP 122/74 | HR 62 | Temp 98.4°F | Resp 18 | Ht 69.0 in | Wt 145.0 lb

## 2012-06-06 DIAGNOSIS — I87039 Postthrombotic syndrome with ulcer and inflammation of unspecified lower extremity: Secondary | ICD-10-CM

## 2012-06-06 NOTE — Progress Notes (Signed)
[facility]  [name], MD,  FACS [address] [place]  [address] [place], [place]    [address] Phone:  [phone] FAX:  [phone]   Re:   Roy Blackwell DOB:   [birth year] MRN:   [id number]  ASSESSMENT AND PLAN: 1.  Bilateral chronic venous stasis ulcers, medially above his ankles. L>R  He grew out Pseudomonas on cultures in May 2012 and has been on Septra since then.  We talked about cutting the dose in half going forward - 1 tab per day.   According to Dr. Zachery Dakins, every time he stopped the antibiotic, the wounds got worse.  So I will continue the Septra for now.   We will photo about every 3 months as long as they are stable.  Both his wounds now seem to have stalled as far as getting better.    The plan going forward (starting Jan 2014) is to have a nurse visit every 2 weeks alternating with seeing me.  So I will see him every month.  The nurses do no need to worry about measuring the wound, unless there has been a dramatic change.  This seems to be working.  We updated photos of the wounds today.   2.  On coumadin.  Followed through the [facility].    Last INR 4/1.  (05/24/2012). He held his coumadin.  He talked about the irony of eating greens. 3.  Hypertrophic cardiomyopathy.  Followed by Dr. Karie Schwalbe. [name] 08/15/2010.   4.  Metastatic prostate cancer.  PSA >1460 (08/31/2011) -> 623 (11/15/2011) -> 348 (03/18/2012).  Has seen Dr. Mena Goes who has put him on Casodex  CT scan 08/31/2011 shows bulky intrathoracic and intraabdominal adenopathy.  He also has a pathologic fx of L4.    He had a IR bone tumor ablation 10/17/2011 by Dr. Corliss Skains.  Saw Dr. Clelia Croft (saw [name] last) 03/18/2012 - metastatic prostate ca.  HISTORY OF PRESENT ILLNESS: Chief Complaint  Patient presents with  . Wound Check   Roy Blackwell is a 79 y.o. (DOB: Aug 14, 1928)  AA male who is a patient of [name], MD (he actually has been assigned a new PCP, but he cannot remember  the name) and comes to me today for follow up of bilateral chronic venous stasis ulcers.    He is doing well.  His appetite is good. No concerns.  Wound history:  Mr. Bouwens was managed by the [facility] wound care center, then by Dr. Zachery Dakins. With Dr. Annette Stable retirement (Dec 2012), he asked if I would look after this patient.  He has had success with Una boots and compression to improve these ulcer.  This is a long term process, but certainly compared to photos from Aug 2012, there has been some improvement in ulcers on both lower extremities.  PHYSICAL EXAM: BP 122/74  Pulse 62  Temp 98.4 F (36.9 C)  Resp 18  Ht 5\' 9"  (1.753 m)  Wt 145 lb (65.772 kg)  BMI 21.41 kg/m2  Lower extremities:  5.0 x 1.5 cm ulcer medial left leg (unchanged from last visit)  3.5 x 1.5 cm ulcer medial right leg.  Both wounds are clean, but there is no ingrowth of new skin.   We are putting an Unna boots on both lower extremities.  DATA REVIEWED: No new labs.  [name], MD, FACS Office:  [phone]

## 2012-06-07 ENCOUNTER — Encounter (INDEPENDENT_AMBULATORY_CARE_PROVIDER_SITE_OTHER): Payer: Medicare Other | Admitting: Surgery

## 2012-06-13 ENCOUNTER — Encounter (INDEPENDENT_AMBULATORY_CARE_PROVIDER_SITE_OTHER): Payer: Medicare Other

## 2012-06-18 ENCOUNTER — Ambulatory Visit (INDEPENDENT_AMBULATORY_CARE_PROVIDER_SITE_OTHER): Payer: Medicare Other | Admitting: *Deleted

## 2012-06-18 DIAGNOSIS — I82409 Acute embolism and thrombosis of unspecified deep veins of unspecified lower extremity: Secondary | ICD-10-CM

## 2012-06-20 ENCOUNTER — Ambulatory Visit (INDEPENDENT_AMBULATORY_CARE_PROVIDER_SITE_OTHER): Payer: Medicare Other

## 2012-06-20 ENCOUNTER — Encounter (INDEPENDENT_AMBULATORY_CARE_PROVIDER_SITE_OTHER): Payer: Self-pay

## 2012-06-20 NOTE — Progress Notes (Unsigned)
Patient here for nurse only bilateral above the ankle ulcers ;  There seems to be improvement in both areas . Cleansed ulcerated area with normal saline; applied povidine  ointment  To areas; covered with 4x4s ; applied Una boots ; covered with medi-Rip self adherent bandage. Patient tolerated well. Next appt with Dr. Ezzard Standing 07/05/12@1130am . Advised patient to call if any changes in condition or concerns. Patient verbalized understanding.

## 2012-06-25 ENCOUNTER — Ambulatory Visit (INDEPENDENT_AMBULATORY_CARE_PROVIDER_SITE_OTHER): Payer: Medicare Other | Admitting: Pharmacist

## 2012-07-05 ENCOUNTER — Encounter (INDEPENDENT_AMBULATORY_CARE_PROVIDER_SITE_OTHER): Payer: Medicare Other | Admitting: Surgery

## 2012-07-11 ENCOUNTER — Ambulatory Visit (INDEPENDENT_AMBULATORY_CARE_PROVIDER_SITE_OTHER): Payer: Medicare Other | Admitting: Surgery

## 2012-07-11 VITALS — BP 128/70 | HR 63 | Temp 99.0°F | Resp 18 | Ht 67.0 in | Wt 140.0 lb
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
## 2012-07-16 ENCOUNTER — Other Ambulatory Visit: Payer: Medicare Other | Admitting: Lab

## 2012-07-16 ENCOUNTER — Ambulatory Visit: Payer: Medicare Other | Admitting: Oncology

## 2012-07-17 ENCOUNTER — Ambulatory Visit (INDEPENDENT_AMBULATORY_CARE_PROVIDER_SITE_OTHER): Payer: Medicare Other | Admitting: *Deleted

## 2012-07-17 ENCOUNTER — Other Ambulatory Visit: Payer: Medicare Other | Admitting: Lab

## 2012-07-17 ENCOUNTER — Ambulatory Visit: Payer: Medicare Other | Admitting: Oncology

## 2012-07-17 DIAGNOSIS — I82409 Acute embolism and thrombosis of unspecified deep veins of unspecified lower extremity: Secondary | ICD-10-CM

## 2012-07-26 ENCOUNTER — Ambulatory Visit (INDEPENDENT_AMBULATORY_CARE_PROVIDER_SITE_OTHER): Payer: Medicare Other

## 2012-07-26 VITALS — BP 128/62 | HR 74 | Temp 98.0°F | Resp 18 | Ht 68.0 in | Wt 137.0 lb

## 2012-07-26 DIAGNOSIS — Z4800 Encounter for change or removal of nonsurgical wound dressing: Secondary | ICD-10-CM

## 2012-07-26 NOTE — Progress Notes (Signed)
Patient here today for wound check. States he has been sick x1 wk feeling better today. Wt loss 4 lb noted Bilateral lower leg Wounds remain unchanged; area cleaned with NS applied povidine onitment to area bilaterally . 4x4's; applied; unna boots applied;  secured with self adherent bandage. Patient tolerated well. Next Ov 08/09/12@10am  with Dr. Ezzard Standing

## 2012-08-01 ENCOUNTER — Telehealth: Payer: Self-pay | Admitting: Internal Medicine

## 2012-08-01 ENCOUNTER — Ambulatory Visit (INDEPENDENT_AMBULATORY_CARE_PROVIDER_SITE_OTHER): Payer: Medicare Other | Admitting: Internal Medicine

## 2012-08-01 ENCOUNTER — Encounter: Payer: Self-pay | Admitting: Internal Medicine

## 2012-08-01 VITALS — BP 112/52 | HR 78 | Temp 98.5°F | Ht 68.0 in | Wt 135.0 lb

## 2012-08-01 DIAGNOSIS — H9190 Unspecified hearing loss, unspecified ear: Secondary | ICD-10-CM

## 2012-08-01 DIAGNOSIS — K5900 Constipation, unspecified: Secondary | ICD-10-CM

## 2012-08-01 DIAGNOSIS — H9191 Unspecified hearing loss, right ear: Secondary | ICD-10-CM | POA: Insufficient documentation

## 2012-08-01 DIAGNOSIS — I10 Essential (primary) hypertension: Secondary | ICD-10-CM

## 2012-08-01 NOTE — Assessment & Plan Note (Signed)
stable overall by history and exam, recent data reviewed with pt, and pt to continue medical treatment as before,  to f/u any worsening symptoms or concerns BP Readings from Last 3 Encounters:  08/01/12 112/52  07/26/12 128/62  07/11/12 128/70

## 2012-08-01 NOTE — Patient Instructions (Signed)
Please continue all other medications as before, including the MOM as you do Your right ear was irrigated today of wax Please keep your appointments with your specialists as you have planned

## 2012-08-01 NOTE — Telephone Encounter (Signed)
[facility] Triage Call Report Triage Record Num: [id number] Operator: Malachi Paradise Patient Name: Roy Blackwell Call Date & Time: 07/31/2012 5:07:22PM Patient Phone: [phone] PCP: [name] Patient Gender: Male PCP Fax : [phone] Patient DOB: 07-Apr-1929 Practice Name: [facility] Reason for Call: Caller: [place]/Patient; PCP: [name] (Adults only); CB#: [phone]; Call regarding Constipation; Onset ongoing Pt's wife states he has not had BM in 2 weeks. Afebrile. All emergent symptoms ruled out per Constipation protocol with exception " No bowel movement for one week and unresponsive to home care." Home care advice given. Per disposition see provider within 24 hours appt scheduled for 08/01/12 1100 with Dr Jonny Ruiz. Protocol(s) Used: Constipation Recommended Outcome per Protocol: See Provider within 24 hours Reason for Outcome: No bowel movement for one week and unresponsive to home care Care Advice: ~ Call provider if symptoms worsen or new symptoms develop. ~ Try a warm bath or apply a warm, wet towel to rectal area to help relax the muscles in the area. Rectal pain or change in bowel habits including recurrent episodes or persistent constipation or constipation alternating with diarrhea, can be the only sign of a serious problem, such as polyps or a tumor. An evaluation by provider must be scheduled. ~ SYMPTOM / CONDITION MANAGEMENT ~ CAUTIONS Constipation Care Measures: - Drink 8 to 10 glasses of liquid per day, more if breastfeeding. - Drink warm water or coffee early in the morning. - Gradually increase dietary fiber (fresh fruits/vegetables, whole grain bread and cereals). - As tolerated, walk 30 minutes at a steady pace daily. - Consider nonprescription stool softeners (Colace) per label, pharmacist or provider recommendations. Stool softners are not habit forming as some stimulant laxatives may become. - Consider nonprescription bulk forming laxatives (such as  Metamucil, FiberCon, Citrucel, etc.); follow package directions. - Avoid routine use of strong laxatives/enemas/suppositories unless ordered by provider. - Do not delay having bowel movement when having urge. - Keep a routine; attempt bowel movement within half hour after a meal or after some exercise.

## 2012-08-01 NOTE — Assessment & Plan Note (Signed)
Improved with irrigation,  to f/u any worsening symptoms or concerns  

## 2012-08-01 NOTE — Progress Notes (Signed)
Subjective:    Patient ID: Roy Blackwell, male    DOB: 02-15-1929, 79 y.o.   MRN: [id number]  HPI  Here to f/u; overall doing ok,  Pt denies chest pain, increased sob or doe, wheezing, orthopnea, PND, increased LE swelling, palpitations, dizziness or syncope.  Pt denies polydipsia, polyuria, Pt denies new neurological symptoms such as new headache, or facial or extremity weakness or numbness.   Pt states overall good compliance with meds, has been trying to follow lower cholesterol diet, with wt overall stable,  but little exercise however. Has had significant constipation with lower abd crampy pains, tried MOM yesterday, had good BM this am just prior to his appt, pain now resolved.   Has some decresed hearing right ear for several wks - ? Wax, no pain, d/c, fever, dizzy Past Medical History  Diagnosis Date  . Venous insufficiency     His diagnosis this is a note on Dravon Nott he was diagnosed as having chronic thrombophlebitis when he was just in back in the 50s and has seen numerous surgeons Dr. Nedra Hai Dr. 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 Dr. Genelle Bal he continued his Coumadin management but he has no history of atri  . Hypertrophic cardiomyopathy   . Prostate cancer 08/27/2011  . Hypertension 08/27/2011  . Pulmonary nodule 08/27/2011    2008  . Hyperlipidemia 08/27/2011  . Inguinal lymphadenopathy 08/31/2011    Bulky bilat 2009 CT  . Warfarin anticoagulation 08/31/2011  . Hydronephrosis 09/01/2011  . CKD (chronic kidney disease) 09/01/2011  . Abdominal lymphadenopathy 09/01/2011   No past surgical history on file.  reports that he has been smoking.  He has never used smokeless tobacco. He reports that he does not drink alcohol or use illicit drugs. family history is not on file. No Known Allergies Current Outpatient Prescriptions on File Prior to Visit  Medication Sig Dispense Refill  .  acetaminophen (TYLENOL) 500 MG tablet Take 500 mg by mouth as needed. For pain.      . Ascorbic Acid (VITAMIN C PO) Take by mouth daily.      . bicalutamide (CASODEX) 50 MG tablet Take 50 mg by mouth daily.      . Cholecalciferol (VITAMIN D PO) Take by mouth daily.      Marland Kitchen sulfamethoxazole-trimethoprim (BACTRIM DS,SEPTRA DS) 800-160 MG per tablet Take 1 tablet by mouth 2 (two) times daily.       Marland Kitchen warfarin (COUMADIN) 5 MG tablet Take as directed by anticoagulation clinic  60 tablet  3  . [DISCONTINUED] furosemide (LASIX) 20 MG tablet Take 20 mg by mouth daily.         Current Facility-Administered Medications on File Prior to Visit  Medication Dose Route Frequency Provider Last Rate Last Dose  . pneumococcal 23 valent vaccine (PNU-IMMUNE) injection 0.5 mL  0.5 mL Intramuscular Once Corwin Levins, MD       Review of Systems  Constitutional: Negative for unexpected weight change, or unusual diaphoresis  HENT: Negative for tinnitus.   Eyes: Negative for photophobia and visual disturbance.  Respiratory: Negative for choking and stridor.   Gastrointestinal: Negative for vomiting and blood in stool.  Genitourinary: Negative for hematuria and decreased urine volume.  Musculoskeletal: Negative for acute joint swelling Skin: Negative for color change and wound.  Neurological: Negative for tremors and numbness other than noted  Psychiatric/Behavioral: Negative for decreased concentration  or  hyperactivity.       Objective:   Physical Exam BP 112/52  Pulse 78  Temp(Src) 98.5 F (36.9 C) (Oral)  Ht 5\' 8"  (1.727 m)  Wt 135 lb (61.236 kg)  BMI 20.53 kg/m2  SpO2 97% VS noted,  Constitutional: Pt appears well-developed and well-nourished.  HENT: Head: NCAT.  Right Ear: External ear normal.  Left Ear: External ear normal. right wax impaction removed with irrigation Eyes: Conjunctivae and EOM are normal. Pupils are equal, round, and reactive to light.  Neck: Normal range of motion. Neck supple.   Cardiovascular: Normal rate and regular rhythm.   Pulmonary/Chest: Effort normal and breath sounds normal.  Abd:  Soft, NT, non-distended, + BS Neurological: Pt is alert. Not confused  Skin: Skin is warm. No erythema.  Psychiatric: Pt behavior is normal. Thought content normal.     Assessment & Plan:

## 2012-08-01 NOTE — Assessment & Plan Note (Signed)
By hx, symptomatically spont resolved just prior to exam, exam benign, ok to cont MOM prn,  to f/u any worsening symptoms or concerns

## 2012-08-07 ENCOUNTER — Ambulatory Visit (INDEPENDENT_AMBULATORY_CARE_PROVIDER_SITE_OTHER): Payer: Medicare Other | Admitting: *Deleted

## 2012-08-07 DIAGNOSIS — I82409 Acute embolism and thrombosis of unspecified deep veins of unspecified lower extremity: Secondary | ICD-10-CM

## 2012-08-07 LAB — PROTIME-INR: INR: 8 ratio (ref 0.8–1.0)

## 2012-08-07 LAB — POCT INR
INR: 7.1
INR: 7.1

## 2012-08-09 ENCOUNTER — Ambulatory Visit (INDEPENDENT_AMBULATORY_CARE_PROVIDER_SITE_OTHER): Payer: Medicare Other | Admitting: Surgery

## 2012-08-09 VITALS — BP 118/62 | HR 68 | Temp 99.0°F | Resp 18 | Ht 67.0 in | Wt 130.0 lb

## 2012-08-09 DIAGNOSIS — I87033 Postthrombotic syndrome with ulcer and inflammation of bilateral lower extremity: Secondary | ICD-10-CM

## 2012-08-09 DIAGNOSIS — I87039 Postthrombotic syndrome with ulcer and inflammation of unspecified lower extremity: Secondary | ICD-10-CM

## 2012-08-09 NOTE — Progress Notes (Signed)
[facility]  Roy Kin, MD,  FACS [address] [place]  [address] [place], [place]    [address] Phone:  [phone] FAX:  [phone]   Re:   Roy OESTREICHER DOB:   August 03, 1928 MRN:   [id number]  ASSESSMENT AND PLAN: 1.  Bilateral chronic venous stasis ulcers, medially above his ankles. L>R  He grew out Pseudomonas on cultures in May 2012 and has been on Septra since then.   According to Dr. Zachery Dakins, every time he stopped the antibiotic, the wounds got worse.  So I have continued the Septra.  The one time we tried to cut back, the wounds became more "oozy".  I gave him another prescription today. (#60 with 3 refills)  We will photo about every 3 months as long as they are stable.  The plan starting Jan 2014 is to have a nurse visit every 2 weeks alternating with seeing me.  So I will see him every month.  The nurses do no need to worry about measuring the wound, unless there has been a dramatic change.    Overall his incisions are better.  He will see me back in 1 month.   2.  On coumadin.  Followed through the [facility].    Last INR 8.0 (08/07/2012). 3.  Hypertrophic cardiomyopathy.    Followed by Dr. Ermalene Postin. Last seen 05/24/2012.  Dr. [name] retiring, unsure who will follow him. 4.  Metastatic prostate cancer.  PSA >1460 (08/31/2011) -> 623 (11/15/2011) -> 348 (03/18/2012).  Has seen Dr. Mena Goes who has put him on Casodex  CT scan 08/31/2011 shows bulky intrathoracic and intraabdominal adenopathy.  He also has a pathologic fx of L4.    He had a IR bone tumor ablation 10/17/2011 by Dr. Corliss Skains.  Saw Dr. Clelia Croft (saw [name] last) 03/18/2012 - metastatic prostate ca.  To see Dr. Clelia Croft and Mena Goes next month. 5.  Constipation 6.  Weight down about 15 pounds over the last 2 months - he weighed 145 in February, 2014.  HISTORY OF PRESENT ILLNESS: Chief Complaint  Patient presents with  . Wound Check   Roy Blackwell is a 79 y.o. (DOB:  06-25-1928)  AA male who is a patient of [name], MD  and comes to me today for follow up of bilateral chronic venous stasis ulcers.    His INR has been high, so he is off his coumadin right now.  And he has had some trouble with constipation.  He is trying prune juice and MOM with limited success.    Wound history:  Mr. Bramble was managed by the [facility] wound care center, then by Dr. Zachery Dakins. With Dr. Annette Stable retirement (Dec 2012), Dr. Zachery Dakins asked if I would look after this patient.  He has had success with Una boots and compression to improve these ulcer.  This is a long term process, but certainly compared to photos from Aug 2012, there has been some improvement in ulcers on both lower extremities.  PHYSICAL EXAM: BP 118/62  Pulse 68  Temp(Src) 99 F (37.2 C)  Resp 18  Ht 5\' 7"  (1.702 m)  Wt 130 lb (58.968 kg)  BMI 20.36 kg/m2  Lower extremities:  3.0 x 1.4 cm ulcer medial left leg  2.0 x 1.0 cm ulcer medial right leg.  Both wounds are clean, but there is limited ingrowth of new skin.   We are putting an Unna boots on both lower extremities.  DATA REVIEWED: Epic labs.  Roy Kin, MD, FACS Office:  [phone]

## 2012-08-12 ENCOUNTER — Ambulatory Visit (INDEPENDENT_AMBULATORY_CARE_PROVIDER_SITE_OTHER): Payer: Medicare Other | Admitting: Pharmacist

## 2012-08-12 DIAGNOSIS — I82409 Acute embolism and thrombosis of unspecified deep veins of unspecified lower extremity: Secondary | ICD-10-CM

## 2012-08-12 LAB — POCT INR: INR: 1.5

## 2012-08-19 ENCOUNTER — Ambulatory Visit (INDEPENDENT_AMBULATORY_CARE_PROVIDER_SITE_OTHER): Payer: Medicare Other | Admitting: *Deleted

## 2012-08-19 ENCOUNTER — Encounter: Payer: Self-pay | Admitting: *Deleted

## 2012-08-19 DIAGNOSIS — I82409 Acute embolism and thrombosis of unspecified deep veins of unspecified lower extremity: Secondary | ICD-10-CM

## 2012-08-19 LAB — POCT INR: INR: 6.3

## 2012-08-19 LAB — PROTIME-INR
INR: 7.2 ratio (ref 0.8–1.0)
Prothrombin Time: 73.4 s (ref 10.2–12.4)

## 2012-08-23 ENCOUNTER — Ambulatory Visit (INDEPENDENT_AMBULATORY_CARE_PROVIDER_SITE_OTHER): Payer: Medicare Other

## 2012-08-23 ENCOUNTER — Ambulatory Visit (INDEPENDENT_AMBULATORY_CARE_PROVIDER_SITE_OTHER): Payer: Medicare Other | Admitting: *Deleted

## 2012-08-23 DIAGNOSIS — I82409 Acute embolism and thrombosis of unspecified deep veins of unspecified lower extremity: Secondary | ICD-10-CM

## 2012-08-23 LAB — POCT INR: INR: 3.6

## 2012-08-23 NOTE — Progress Notes (Unsigned)
Patient in for nurse only dressing change lower legs bilaterally; Patient states he has not been  feeling very well  in the last couple weeks . Deceased appetite and constipation voiced; He is been ing followed by his PCP  . Lower legs bilaterally cleansed inner lower leg wounds with saline. Applied povidone ointment on 4x4s To wounds bilaterally ; [employer] bilaterally; Applied Medi-Rip self-adherent bandage bilaterally. Patient tolerated well. Follow up appointment made with Dr. Ezzard Standing 09/11/12 @ 1130 am Patient agreed to date and time. Advised patient to increase his green leafy vegs and fluids and also to try warm prune juice followed by coffee if possible for constipation. Patient verbalized understanding.

## 2012-08-29 ENCOUNTER — Ambulatory Visit (INDEPENDENT_AMBULATORY_CARE_PROVIDER_SITE_OTHER): Payer: Medicare Other

## 2012-08-29 DIAGNOSIS — I82409 Acute embolism and thrombosis of unspecified deep veins of unspecified lower extremity: Secondary | ICD-10-CM

## 2012-08-29 DIAGNOSIS — Z7901 Long term (current) use of anticoagulants: Secondary | ICD-10-CM

## 2012-08-29 LAB — PROTIME-INR: INR: 7 ratio (ref 0.8–1.0)

## 2012-08-30 ENCOUNTER — Ambulatory Visit (HOSPITAL_BASED_OUTPATIENT_CLINIC_OR_DEPARTMENT_OTHER): Payer: Medicare Other

## 2012-08-30 ENCOUNTER — Other Ambulatory Visit: Payer: Medicare Other | Admitting: Lab

## 2012-08-30 ENCOUNTER — Telehealth: Payer: Self-pay | Admitting: Oncology

## 2012-08-30 ENCOUNTER — Encounter (HOSPITAL_COMMUNITY)
Admission: RE | Admit: 2012-08-30 | Discharge: 2012-08-30 | Disposition: A | Discharge status: Home / Self Care | Payer: Medicare Other | Source: Ambulatory Visit | Attending: Oncology | Admitting: Oncology

## 2012-08-30 ENCOUNTER — Ambulatory Visit (HOSPITAL_BASED_OUTPATIENT_CLINIC_OR_DEPARTMENT_OTHER): Payer: Medicare Other | Admitting: Oncology

## 2012-08-30 ENCOUNTER — Other Ambulatory Visit (HOSPITAL_BASED_OUTPATIENT_CLINIC_OR_DEPARTMENT_OTHER): Payer: Medicare Other | Admitting: Lab

## 2012-08-30 VITALS — BP 128/49 | HR 59 | Temp 98.2°F | Resp 20

## 2012-08-30 VITALS — BP 132/47 | HR 67 | Temp 98.3°F | Resp 18 | Ht 69.0 in | Wt 125.5 lb

## 2012-08-30 DIAGNOSIS — E875 Hyperkalemia: Secondary | ICD-10-CM

## 2012-08-30 DIAGNOSIS — C61 Malignant neoplasm of prostate: Secondary | ICD-10-CM

## 2012-08-30 DIAGNOSIS — D539 Nutritional anemia, unspecified: Secondary | ICD-10-CM

## 2012-08-30 DIAGNOSIS — D649 Anemia, unspecified: Secondary | ICD-10-CM

## 2012-08-30 DIAGNOSIS — M949 Disorder of cartilage, unspecified: Secondary | ICD-10-CM

## 2012-08-30 DIAGNOSIS — G893 Neoplasm related pain (acute) (chronic): Secondary | ICD-10-CM

## 2012-08-30 LAB — CBC WITH DIFFERENTIAL/PLATELET
BASO%: 0.3 % (ref 0.0–2.0)
EOS%: 0.7 % (ref 0.0–7.0)
HCT: 18.8 % — ABNORMAL LOW (ref 38.4–49.9)
LYMPH%: 29.8 % (ref 14.0–49.0)
MCH: 29.6 pg (ref 27.2–33.4)
MCHC: 31.4 g/dL — ABNORMAL LOW (ref 32.0–36.0)
MCV: 94.5 fL (ref 79.3–98.0)
MONO%: 11.1 % (ref 0.0–14.0)
NEUT%: 58.1 % (ref 39.0–75.0)
Platelets: 446 10*3/uL — ABNORMAL HIGH (ref 140–400)

## 2012-08-30 LAB — TECHNOLOGIST REVIEW

## 2012-08-30 LAB — COMPREHENSIVE METABOLIC PANEL (CC13)
AST: 18 U/L (ref 5–34)
Alkaline Phosphatase: 126 U/L (ref 40–150)
BUN: 27.8 mg/dL — ABNORMAL HIGH (ref 7.0–26.0)
Creatinine: 1.6 mg/dL — ABNORMAL HIGH (ref 0.7–1.3)
Glucose: 101 mg/dl — ABNORMAL HIGH (ref 70–99)

## 2012-08-30 LAB — PREPARE RBC (CROSSMATCH)

## 2012-08-30 MED ORDER — SODIUM CHLORIDE 0.9 % IV SOLN
250.0000 mL | Freq: Once | INTRAVENOUS | Status: AC
  Administered 2012-08-30: 250 mL via INTRAVENOUS

## 2012-08-30 NOTE — Patient Instructions (Signed)
Blood Transfusion Information WHAT IS A BLOOD TRANSFUSION? A transfusion is the replacement of blood or some of its parts. Blood is made up of multiple cells which provide different functions.  Red blood cells carry oxygen and are used for blood loss replacement.  White blood cells fight against infection.  Platelets control bleeding.  Plasma helps clot blood.  Other blood products are available for specialized needs, such as hemophilia or other clotting disorders. BEFORE THE TRANSFUSION  Who gives blood for transfusions?   You may be able to donate blood to be used at a later date on yourself (autologous donation).  Relatives can be asked to donate blood. This is generally not any safer than if you have received blood from a stranger. The same precautions are taken to ensure safety when a relative's blood is donated.  Healthy volunteers who are fully evaluated to make sure their blood is safe. This is blood bank blood. Transfusion therapy is the safest it has ever been in the practice of medicine. Before blood is taken from a donor, a complete history is taken to make sure that person has no history of diseases nor engages in risky social behavior (examples are intravenous drug use or sexual activity with multiple partners). The donor's travel history is screened to minimize risk of transmitting infections, such as malaria. The donated blood is tested for signs of infectious diseases, such as HIV and hepatitis. The blood is then tested to be sure it is compatible with you in order to minimize the chance of a transfusion reaction. If you or a relative donates blood, this is often done in anticipation of surgery and is not appropriate for emergency situations. It takes many days to process the donated blood. RISKS AND COMPLICATIONS Although transfusion therapy is very safe and saves many lives, the main dangers of transfusion include:   Getting an infectious disease.  Developing a  transfusion reaction. This is an allergic reaction to something in the blood you were given. Every precaution is taken to prevent this. The decision to have a blood transfusion has been considered carefully by your caregiver before blood is given. Blood is not given unless the benefits outweigh the risks. AFTER THE TRANSFUSION  Right after receiving a blood transfusion, you will usually feel much better and more energetic. This is especially true if your red blood cells have gotten low (anemic). The transfusion raises the level of the red blood cells which carry oxygen, and this usually causes an energy increase.  The nurse administering the transfusion will monitor you carefully for complications. HOME CARE INSTRUCTIONS  No special instructions are needed after a transfusion. You may find your energy is better. Speak with your caregiver about any limitations on activity for underlying diseases you may have. SEEK MEDICAL CARE IF:   Your condition is not improving after your transfusion.  You develop redness or irritation at the intravenous (IV) site. SEEK IMMEDIATE MEDICAL CARE IF:  Any of the following symptoms occur over the next 12 hours:  Shaking chills.  You have a temperature by mouth above 102 F (38.9 C), not controlled by medicine.  Chest, back, or muscle pain.  People around you feel you are not acting correctly or are confused.  Shortness of breath or difficulty breathing.  Dizziness and fainting.  You get a rash or develop hives.  You have a decrease in urine output.  Your urine turns a dark color or changes to pink, red, or brown. Any of the following   symptoms occur over the next 10 days:  You have a temperature by mouth above 102 F (38.9 C), not controlled by medicine.  Shortness of breath.  Weakness after normal activity.  The white part of the eye turns yellow (jaundice).  You have a decrease in the amount of urine or are urinating less often.  Your  urine turns a dark color or changes to pink, red, or brown. Document Released: 04/14/2000 Document Revised: 07/10/2011 Document Reviewed: 12/02/2007 Exit[employer] Patient Information 2013 [employer].  

## 2012-08-30 NOTE — Telephone Encounter (Signed)
gv and printed appt sched and avs for pt  °

## 2012-08-30 NOTE — Progress Notes (Signed)
Hematology and Oncology Follow Up Visit  Roy Blackwell [id number] 17-Nov-1928 79 y.o. 08/30/2012 10:16 AM Roy Blackwell, MDJohn, Roy [name], MD   Principle Diagnosis:  79year old with hormone sensitive advanced prostate cancer diagnosed in 08/2011. His diagnosis made in 1992.   Prior Therapy: Radiation therapy in about 1992 (records unavailable to [place])  Current therapy: Hormone therapy in the form of Framagon under the care of Roy Blackwell. Patient states he was started on Casodex around early November 2013. e is also on Xgeva at [facility].  Interim History:  Roy Blackwell is an 79year old male seen today for routine follow-up. He has been doing fair since last visit. He is S/P vertebroplasty by dr. Corliss Blackwell in 09/2011 with pathology confirming prostate cancer. His moving a lot better but continues to loose weight. No loss of control of bowel of bladder and no neurological changes. Denies chest pain, shortness of breath, nausea, vomiting. Mild constipation and uses dietary modification with relief. No bleeding noted per his report.   Medications: I have reviewed the patient's current medications. Current outpatient prescriptions:acetaminophen (TYLENOL) 500 MG tablet, Take 500 mg by mouth as needed. For pain., Disp: , Rfl: ;  Ascorbic Acid (VITAMIN C PO), Take by mouth daily., Disp: , Rfl: ;  bicalutamide (CASODEX) 50 MG tablet, Take 50 mg by mouth daily., Disp: , Rfl: ;  Cholecalciferol (VITAMIN D PO), Take by mouth daily., Disp: , Rfl:  sulfamethoxazole-trimethoprim (BACTRIM DS,SEPTRA DS) 800-160 MG per tablet, Take 1 tablet by mouth 2 (two) times daily. , Disp: , Rfl: ;  warfarin (COUMADIN) 5 MG tablet, Take as directed by anticoagulation clinic, Disp: 60 tablet, Rfl: 3;  [DISCONTINUED] furosemide (LASIX) 20 MG tablet, Take 20 mg by mouth daily.  , Disp: , Rfl:  Current facility-administered medications:pneumococcal 23 valent vaccine (PNU-IMMUNE) injection 0.5 mL, 0.5 mL, Intramuscular, Once, Roy Levins, MD  Allergies: No Known Allergies  Past Medical History, Surgical history, Social history, and Family History were reviewed and updated.  Review of Systems: Constitutional:  Negative for fever, chills, night sweats, anorexia, weight loss, pain. Cardiovascular: no chest pain or dyspnea on exertion Respiratory: no cough, shortness of breath, or wheezing Neurological: no TIA or stroke symptoms Dermatological: negative ENT: negative Skin: Negative. Gastrointestinal: no abdominal pain, change in bowel habits, or black or bloody stools Genito-Urinary: no dysuria, trouble voiding, or hematuria Hematological and Lymphatic: negative Breast: negative for breast lumps Musculoskeletal: negative Remaining ROS negative.  Physical Exam: Blood pressure 132/47, pulse 67, temperature 98.3 F (36.8 C), temperature source Oral, resp. rate 18, height 5\' 9"  (1.753 m), weight 125 lb 8 oz (56.926 kg), SpO2 100.00%. ECOG: 1 General appearance: alert, cooperative and no distress Head: Normocephalic, without obvious abnormality, atraumatic Neck: no adenopathy, no carotid bruit, no JVD, supple, symmetrical, trachea midline and thyroid not enlarged, symmetric, no tenderness/mass/nodules Lymph nodes: Cervical, supraclavicular, and axillary nodes normal. Heart:regular rate and rhythm, S1, S2 normal, no murmur, click, rub or gallop Lung:chest clear, no wheezing, rales, normal symmetric air entry, no tachypnea, retractions or cyanosis Abdomen: soft, non-tender, without masses or organomegaly EXT:no erythema, induration, or nodules   Lab Results: Lab Results  Component Value Date   WBC 9.4 08/30/2012   HGB 5.9* 08/30/2012   HCT 18.8* 08/30/2012   MCV 94.5 08/30/2012   PLT 446* 08/30/2012     Chemistry      Component Value Date/Time   NA 137 08/30/2012 0930   NA 144 11/22/2011 1236   K  6.0 Repeated and Verified* 08/30/2012 0930   K 4.8 11/22/2011 1236   CL 105 08/30/2012 0930   CL 109 11/22/2011 1236   CO2 21*  08/30/2012 0930   CO2 25 11/22/2011 1236   BUN 27.8* 08/30/2012 0930   BUN 13 11/22/2011 1236   CREATININE 1.6* 08/30/2012 0930   CREATININE 0.72 11/22/2011 1236      Component Value Date/Time   CALCIUM 10.4 08/30/2012 0930   CALCIUM 9.2 11/22/2011 1236   ALKPHOS 126 08/30/2012 0930   ALKPHOS 117 11/15/2011 0937   AST 18 08/30/2012 0930   AST 13 11/15/2011 0937   ALT <6 Repeated and Verified 08/30/2012 0930   ALT 8 11/15/2011 0937   BILITOT 0.26 08/30/2012 0930   BILITOT 0.2* 11/15/2011 0937      Results for Roy Blackwell, Roy Blackwell (MRN [id number]) as of 08/30/2012 09:44  Ref. Range 11/15/2011 09:37 03/18/2012 09:58  PSA Latest Range: <=4.00 ng/mL 623.90 (H) 348.40 (H)       Impression and Plan: This is an 79 year old gentleman with the following issues:  1. Advanced prostate cancer, had presented itself with bulky retroperitoneal lymph nodes as well as a lytic bony lesion at L4. He does have history of prostate cancer dating back to the early 1990s to late 1980s treated with radiation therapy, and an elevated PSA of 1460. He is getting treated with Roy Blackwell without complications. Casodex was recently added with goof PSA response. I agree with that at this time and when he develops CRPC we will add different agents. I will continue to follow him as well.   2. Bony disease. On Xgeva at [facility].   3. Pain: due to prostate caner is much better now.   4. Anemia: This is chronic but it's worse today. This is likely due to chronic disease as well as malignancy. His minimally symptomatic. I will arrange for him to get one unit of blood today. I will have him back for a quick follow up in 3-4 weeks to recheck it again. We might consider Aransep as well.   5. Elevated INR. No bleeding noted. He is followed by the Warfarin clinic not in the cancer center.   6. Follow up: in 4 weeks for CBC and clinical check.      Pandora Mccrackin 5/2/201410:16 AM

## 2012-08-31 LAB — TYPE AND SCREEN: ABO/RH(D): A POS

## 2012-09-03 ENCOUNTER — Ambulatory Visit: Payer: Medicare Other | Admitting: Oncology

## 2012-09-03 ENCOUNTER — Other Ambulatory Visit: Payer: Medicare Other | Admitting: Lab

## 2012-09-05 ENCOUNTER — Telehealth: Payer: Self-pay | Admitting: Pharmacist

## 2012-09-05 ENCOUNTER — Ambulatory Visit: Payer: Self-pay | Admitting: Pharmacist

## 2012-09-05 ENCOUNTER — Ambulatory Visit (INDEPENDENT_AMBULATORY_CARE_PROVIDER_SITE_OTHER): Payer: Medicare Other | Admitting: Pharmacist

## 2012-09-05 DIAGNOSIS — I82409 Acute embolism and thrombosis of unspecified deep veins of unspecified lower extremity: Secondary | ICD-10-CM

## 2012-09-05 NOTE — Telephone Encounter (Signed)
Spoke with pt.  He is aware okay to stop Coumadin.  Will cancel his upcoming appt.

## 2012-09-05 NOTE — Telephone Encounter (Signed)
Message copied by Velda Shell on Thu Sep 05, 2012  1:58 PM ------      Message from: Corwin Levins      Created: Thu Sep 05, 2012 12:21 PM      Regarding: ? coumadin       Given the history, OK to stop the coumadin at this time            Please let me know if I need to notify pt, thanks      ----- Message -----         From: Mariane Masters, [facility]         Sent: 09/05/2012  12:07 PM           To: Corwin Levins, MD            Roy Blackwell has been on Coumadin since the 60s.  He has chronic anemia but required a blood transfusion last week for a Hgb of 5.9.  We have had a hard time regulating his INRs in the past 2 months- not sure why other than if it is related to his cancer.  Do you mind looking at his chart and seeing if we can stop Coumadin.  We are concerned he has started to decompensate some in the past few weeks.        ------

## 2012-09-11 ENCOUNTER — Encounter (INDEPENDENT_AMBULATORY_CARE_PROVIDER_SITE_OTHER): Payer: Self-pay | Admitting: Surgery

## 2012-09-11 ENCOUNTER — Ambulatory Visit (INDEPENDENT_AMBULATORY_CARE_PROVIDER_SITE_OTHER): Payer: Medicare Other | Admitting: Surgery

## 2012-09-11 VITALS — BP 122/72 | HR 78 | Temp 98.4°F | Resp 18 | Ht 66.0 in | Wt 121.4 lb

## 2012-09-11 DIAGNOSIS — I87039 Postthrombotic syndrome with ulcer and inflammation of unspecified lower extremity: Secondary | ICD-10-CM

## 2012-09-11 DIAGNOSIS — I87033 Postthrombotic syndrome with ulcer and inflammation of bilateral lower extremity: Secondary | ICD-10-CM

## 2012-09-11 NOTE — Progress Notes (Signed)
[facility]  Roy Kin, MD,  FACS [address] [place]  [address] [place], [place]    [address] Phone:  [phone] FAX:  [phone]   Re:   EVO ADERMAN DOB:   Sep 20, 1928 MRN:   [id number]  ASSESSMENT AND PLAN: 1.  Bilateral chronic venous stasis ulcers, medially above his ankles. L>R  He grew out Pseudomonas on cultures in May 2012 and has been on Septra since then.   According to Dr. Zachery Dakins, every time he stopped the antibiotic, the wounds got worse.  So I have continued the Septra.  The one time we tried to cut back, the wounds became more "oozy".   We will photo about every 3 months as long as they are stable.  The plan starting Jan 2014 is to have a nurse visit every 2 weeks alternating with seeing me.  So I will see him every month.  The nurses do no need to worry about measuring the wound, unless there has been a dramatic change.    Overall his wounds are about the same.  He will see me back in 1 month.   2.  On coumadin.  Followed through the [facility].    Last INR 1.6 (09/05/2012). 3.  Hypertrophic cardiomyopathy.    Followed by Dr. Ermalene Postin. Last seen 05/24/2012.  Dr. [name] retiring, unsure who will follow him. 4.  Metastatic prostate cancer.  PSA >1460 (08/31/2011) -> 623 (11/15/2011) -> 348 (03/18/2012) -> 587 (08/30/2012).    Followed by Dr. Mena Goes - On Firmagon/Casodex.  On Xgeva for boney disease.  CT scan 08/31/2011 shows bulky intrathoracic and intraabdominal adenopathy.  He also has a pathologic fx of L4.  For another CT in July, 2014.  He had a IR bone tumor ablation 10/17/2011 by Dr. Corliss Skains.  Saw Dr. Clelia Croft (08/30/2012) - for metastatic prostate ca. 5.  Constipation 6.  Weight down about 24 pounds over the last 3 months  He weighed 145 in February, 2014.  He's clearly not feeling as well as he was.  It is ominous that his CEA is creeping up and his appetite has decreased. 7.  Anemia  Hgb - 5.2 - 08/30/2012  Has been transfused  once.  HISTORY OF PRESENT ILLNESS: Chief Complaint  Patient presents with  . Routine Post Op    UNA boots/LLL wd f/u   MINORU CHAP is a 79 y.o. (DOB: [birth year])  AA male who is a patient of [name], MD  and comes to me today for follow up of bilateral chronic venous stasis ulcers.    Mr. Petillo has lost about 20 pounds over the last 4 months.  He doesn't feel as good as he has.  He thinks the legs are stable and okay.  He is now off his Coumadin, with what sounds like plans for being permanently off the Coumadin.  He is anemic and has received at least one unit of blood.   Wound history:  Mr. Pustejovsky was managed by the [facility] wound care center, then by Dr. Zachery Dakins. With Dr. Annette Stable retirement (Dec 2012), Dr. Zachery Dakins asked if I would look after this patient.  He has had success with Una boots and compression to improve these ulcer.  This is a long term process, but certainly compared to photos from Aug 2012, there has been some improvement in ulcers on both lower extremities.  PHYSICAL EXAM: BP 122/72  Pulse 78  Temp(Src) 98.4 F (36.9 C)  Resp 18  Ht 5\' 6"  (1.676 m)  Wt 121 lb 6.4 oz (55.067 kg)  BMI 19.6 kg/m2  Lower extremities:  4.0 x 1.5 cm ulcer medial left leg  3.0 x 1.0 cm ulcer medial right leg.  Both wounds are covered with a scab today.  I left the scab intact.  We are putting an Unna boots on both lower extremities.  DATA REVIEWED: Epic labs.  Roy Kin, MD, FACS Office:  [phone]

## 2012-09-23 ENCOUNTER — Encounter (HOSPITAL_COMMUNITY): Payer: Self-pay | Admitting: Emergency Medicine

## 2012-09-23 ENCOUNTER — Other Ambulatory Visit: Payer: Self-pay

## 2012-09-23 ENCOUNTER — Inpatient Hospital Stay (HOSPITAL_COMMUNITY)
Admission: EM | Admit: 2012-09-23 | Discharge: 2012-10-01 | DRG: 722 | Disposition: A | Discharge status: Skilled Nursing Facility | Payer: Medicare Other | Attending: Internal Medicine | Admitting: Internal Medicine

## 2012-09-23 ENCOUNTER — Emergency Department (HOSPITAL_COMMUNITY): Payer: Medicare Other

## 2012-09-23 ENCOUNTER — Inpatient Hospital Stay (HOSPITAL_COMMUNITY): Payer: Medicare Other

## 2012-09-23 DIAGNOSIS — L97309 Non-pressure chronic ulcer of unspecified ankle with unspecified severity: Secondary | ICD-10-CM | POA: Diagnosis present

## 2012-09-23 DIAGNOSIS — C7951 Secondary malignant neoplasm of bone: Secondary | ICD-10-CM | POA: Diagnosis present

## 2012-09-23 DIAGNOSIS — G929 Unspecified toxic encephalopathy: Secondary | ICD-10-CM | POA: Diagnosis present

## 2012-09-23 DIAGNOSIS — Z86718 Personal history of other venous thrombosis and embolism: Secondary | ICD-10-CM

## 2012-09-23 DIAGNOSIS — R531 Weakness: Secondary | ICD-10-CM

## 2012-09-23 DIAGNOSIS — F411 Generalized anxiety disorder: Secondary | ICD-10-CM | POA: Diagnosis present

## 2012-09-23 DIAGNOSIS — I421 Obstructive hypertrophic cardiomyopathy: Secondary | ICD-10-CM | POA: Diagnosis present

## 2012-09-23 DIAGNOSIS — L89899 Pressure ulcer of other site, unspecified stage: Secondary | ICD-10-CM | POA: Diagnosis present

## 2012-09-23 DIAGNOSIS — N189 Chronic kidney disease, unspecified: Secondary | ICD-10-CM | POA: Diagnosis present

## 2012-09-23 DIAGNOSIS — I10 Essential (primary) hypertension: Secondary | ICD-10-CM | POA: Diagnosis present

## 2012-09-23 DIAGNOSIS — E871 Hypo-osmolality and hyponatremia: Secondary | ICD-10-CM | POA: Diagnosis present

## 2012-09-23 DIAGNOSIS — N1832 Chronic kidney disease, stage 3b: Secondary | ICD-10-CM | POA: Diagnosis present

## 2012-09-23 DIAGNOSIS — R1310 Dysphagia, unspecified: Secondary | ICD-10-CM | POA: Diagnosis present

## 2012-09-23 DIAGNOSIS — R1319 Other dysphagia: Secondary | ICD-10-CM

## 2012-09-23 DIAGNOSIS — L97909 Non-pressure chronic ulcer of unspecified part of unspecified lower leg with unspecified severity: Secondary | ICD-10-CM | POA: Diagnosis present

## 2012-09-23 DIAGNOSIS — Z9181 History of falling: Secondary | ICD-10-CM

## 2012-09-23 DIAGNOSIS — D649 Anemia, unspecified: Secondary | ICD-10-CM | POA: Diagnosis present

## 2012-09-23 DIAGNOSIS — C7952 Secondary malignant neoplasm of bone marrow: Secondary | ICD-10-CM | POA: Diagnosis present

## 2012-09-23 DIAGNOSIS — E785 Hyperlipidemia, unspecified: Secondary | ICD-10-CM | POA: Diagnosis present

## 2012-09-23 DIAGNOSIS — N400 Enlarged prostate without lower urinary tract symptoms: Secondary | ICD-10-CM | POA: Diagnosis present

## 2012-09-23 DIAGNOSIS — I82409 Acute embolism and thrombosis of unspecified deep veins of unspecified lower extremity: Secondary | ICD-10-CM | POA: Diagnosis present

## 2012-09-23 DIAGNOSIS — M8448XA Pathological fracture, other site, initial encounter for fracture: Secondary | ICD-10-CM | POA: Diagnosis present

## 2012-09-23 DIAGNOSIS — I87039 Postthrombotic syndrome with ulcer and inflammation of unspecified lower extremity: Secondary | ICD-10-CM | POA: Diagnosis present

## 2012-09-23 DIAGNOSIS — C7982 Secondary malignant neoplasm of genital organs: Principal | ICD-10-CM | POA: Diagnosis present

## 2012-09-23 DIAGNOSIS — R590 Localized enlarged lymph nodes: Secondary | ICD-10-CM

## 2012-09-23 DIAGNOSIS — L89509 Pressure ulcer of unspecified ankle, unspecified stage: Secondary | ICD-10-CM | POA: Diagnosis present

## 2012-09-23 DIAGNOSIS — E43 Unspecified severe protein-calorie malnutrition: Secondary | ICD-10-CM | POA: Diagnosis present

## 2012-09-23 DIAGNOSIS — S12000A Unspecified displaced fracture of first cervical vertebra, initial encounter for closed fracture: Secondary | ICD-10-CM

## 2012-09-23 DIAGNOSIS — W1789XA Other fall from one level to another, initial encounter: Secondary | ICD-10-CM | POA: Diagnosis present

## 2012-09-23 DIAGNOSIS — Z515 Encounter for palliative care: Secondary | ICD-10-CM

## 2012-09-23 DIAGNOSIS — E440 Moderate protein-calorie malnutrition: Secondary | ICD-10-CM | POA: Diagnosis present

## 2012-09-23 DIAGNOSIS — I872 Venous insufficiency (chronic) (peripheral): Secondary | ICD-10-CM | POA: Diagnosis present

## 2012-09-23 DIAGNOSIS — R638 Other symptoms and signs concerning food and fluid intake: Secondary | ICD-10-CM

## 2012-09-23 DIAGNOSIS — K5900 Constipation, unspecified: Secondary | ICD-10-CM | POA: Diagnosis present

## 2012-09-23 DIAGNOSIS — L8992 Pressure ulcer of unspecified site, stage 2: Secondary | ICD-10-CM | POA: Diagnosis present

## 2012-09-23 DIAGNOSIS — R627 Adult failure to thrive: Secondary | ICD-10-CM

## 2012-09-23 DIAGNOSIS — N390 Urinary tract infection, site not specified: Secondary | ICD-10-CM | POA: Diagnosis present

## 2012-09-23 DIAGNOSIS — R599 Enlarged lymph nodes, unspecified: Secondary | ICD-10-CM

## 2012-09-23 DIAGNOSIS — S129XXA Fracture of neck, unspecified, initial encounter: Secondary | ICD-10-CM | POA: Diagnosis present

## 2012-09-23 DIAGNOSIS — I422 Other hypertrophic cardiomyopathy: Secondary | ICD-10-CM | POA: Diagnosis present

## 2012-09-23 DIAGNOSIS — C61 Malignant neoplasm of prostate: Secondary | ICD-10-CM | POA: Diagnosis present

## 2012-09-23 DIAGNOSIS — IMO0002 Reserved for concepts with insufficient information to code with codable children: Secondary | ICD-10-CM

## 2012-09-23 DIAGNOSIS — G92 Toxic encephalopathy: Secondary | ICD-10-CM | POA: Diagnosis present

## 2012-09-23 DIAGNOSIS — N179 Acute kidney failure, unspecified: Secondary | ICD-10-CM | POA: Diagnosis present

## 2012-09-23 LAB — CBC WITH DIFFERENTIAL/PLATELET
Basophils Absolute: 0.1 10*3/uL (ref 0.0–0.1)
Eosinophils Absolute: 0.1 10*3/uL (ref 0.0–0.7)
Lymphs Abs: 3.1 10*3/uL (ref 0.7–4.0)
MCH: 30.8 pg (ref 26.0–34.0)
MCHC: 33 g/dL (ref 30.0–36.0)
MCV: 93.3 fL (ref 78.0–100.0)
Monocytes Absolute: 1.4 10*3/uL — ABNORMAL HIGH (ref 0.1–1.0)
Monocytes Relative: 11 % (ref 3–12)
Neutro Abs: 7.7 10*3/uL (ref 1.7–7.7)
Platelets: 321 10*3/uL (ref 150–400)
RDW: 18.5 % — ABNORMAL HIGH (ref 11.5–15.5)
WBC: 12.4 10*3/uL — ABNORMAL HIGH (ref 4.0–10.5)

## 2012-09-23 LAB — HEMOGLOBIN AND HEMATOCRIT, BLOOD
HCT: 26.7 % — ABNORMAL LOW (ref 39.0–52.0)
Hemoglobin: 9.2 g/dL — ABNORMAL LOW (ref 13.0–17.0)

## 2012-09-23 LAB — URINALYSIS, ROUTINE W REFLEX MICROSCOPIC
Glucose, UA: NEGATIVE mg/dL
Ketones, ur: NEGATIVE mg/dL
Leukocytes, UA: NEGATIVE
Nitrite: NEGATIVE
Protein, ur: NEGATIVE mg/dL
pH: 5 (ref 5.0–8.0)

## 2012-09-23 LAB — PREPARE RBC (CROSSMATCH)

## 2012-09-23 LAB — ABO/RH: ABO/RH(D): A POS

## 2012-09-23 MED ORDER — SODIUM CHLORIDE 0.9 % IV SOLN
INTRAVENOUS | Status: DC
  Administered 2012-09-23 – 2012-09-24 (×2): via INTRAVENOUS

## 2012-09-23 MED ORDER — DOCUSATE SODIUM 100 MG PO CAPS
100.0000 mg | ORAL_CAPSULE | Freq: Two times a day (BID) | ORAL | Status: DC
  Administered 2012-09-23: 100 mg via ORAL
  Filled 2012-09-23 (×5): qty 1

## 2012-09-23 MED ORDER — SODIUM CHLORIDE 0.9 % IJ SOLN
3.0000 mL | Freq: Two times a day (BID) | INTRAMUSCULAR | Status: DC
  Administered 2012-09-28 – 2012-10-01 (×5): 3 mL via INTRAVENOUS

## 2012-09-23 MED ORDER — FUROSEMIDE 10 MG/ML IJ SOLN
20.0000 mg | Freq: Once | INTRAMUSCULAR | Status: DC
  Filled 2012-09-23: qty 2

## 2012-09-23 MED ORDER — FOLIC ACID 1 MG PO TABS
1.0000 mg | ORAL_TABLET | Freq: Every day | ORAL | Status: DC
  Administered 2012-09-23: 1 mg via ORAL
  Filled 2012-09-23 (×2): qty 1

## 2012-09-23 MED ORDER — ENSURE PUDDING PO PUDG
1.0000 | Freq: Three times a day (TID) | ORAL | Status: DC
  Administered 2012-09-23 – 2012-10-01 (×21): 1 via ORAL
  Filled 2012-09-23: qty 1

## 2012-09-23 MED ORDER — BICALUTAMIDE 50 MG PO TABS
50.0000 mg | ORAL_TABLET | Freq: Every day | ORAL | Status: DC
  Administered 2012-09-23 – 2012-10-01 (×9): 50 mg via ORAL
  Filled 2012-09-23 (×9): qty 1

## 2012-09-23 MED ORDER — PANTOPRAZOLE SODIUM 40 MG PO TBEC: 40.0000 mg | DELAYED_RELEASE_TABLET | Freq: Every day | ORAL | Status: DC

## 2012-09-23 MED ORDER — VITAMIN B-1 100 MG PO TABS
100.0000 mg | ORAL_TABLET | Freq: Every day | ORAL | Status: DC
  Administered 2012-09-23: 100 mg via ORAL
  Filled 2012-09-23 (×2): qty 1

## 2012-09-23 MED ORDER — SENNA 8.6 MG PO TABS
1.0000 | ORAL_TABLET | Freq: Two times a day (BID) | ORAL | Status: DC
  Administered 2012-09-23 – 2012-09-24 (×3): 8.6 mg via ORAL
  Filled 2012-09-23 (×5): qty 1

## 2012-09-23 MED ORDER — POLYETHYLENE GLYCOL 3350 17 G PO PACK
34.0000 g | PACK | Freq: Two times a day (BID) | ORAL | Status: DC
  Administered 2012-09-23 – 2012-09-28 (×8): 34 g via ORAL
  Filled 2012-09-23 (×18): qty 2

## 2012-09-23 MED ORDER — HEPARIN SODIUM (PORCINE) 5000 UNIT/ML IJ SOLN
5000.0000 [IU] | Freq: Three times a day (TID) | INTRAMUSCULAR | Status: DC
  Administered 2012-09-23 – 2012-10-01 (×24): 5000 [IU] via SUBCUTANEOUS
  Filled 2012-09-23 (×27): qty 1

## 2012-09-23 MED ORDER — ACETAMINOPHEN 500 MG PO TABS
500.0000 mg | ORAL_TABLET | Freq: Three times a day (TID) | ORAL | Status: DC | PRN
  Administered 2012-09-25 – 2012-09-29 (×3): 500 mg via ORAL
  Filled 2012-09-23 (×3): qty 1

## 2012-09-23 MED ORDER — BIOTENE DRY MOUTH MT LIQD
15.0000 mL | Freq: Two times a day (BID) | OROMUCOSAL | Status: DC
  Administered 2012-09-23 – 2012-10-01 (×15): 15 mL via OROMUCOSAL

## 2012-09-23 MED ORDER — ALUM & MAG HYDROXIDE-SIMETH 200-200-20 MG/5ML PO SUSP: 15.0000 mL | ORAL | Status: DC | PRN

## 2012-09-23 MED ORDER — ONDANSETRON HCL 4 MG/2ML IJ SOLN: 4.0000 mg | Freq: Four times a day (QID) | INTRAMUSCULAR | Status: DC | PRN

## 2012-09-23 MED ORDER — SODIUM CHLORIDE 0.9 % IV BOLUS (SEPSIS)
500.0000 mL | Freq: Once | INTRAVENOUS | Status: AC
  Administered 2012-09-23: 500 mL via INTRAVENOUS

## 2012-09-23 MED ORDER — CALCITONIN (SALMON) 200 UNIT/ML IJ SOLN
100.0000 [IU] | Freq: Every day | INTRAMUSCULAR | Status: DC
  Administered 2012-09-23 – 2012-09-27 (×5): 100 [IU] via SUBCUTANEOUS
  Filled 2012-09-23 (×6): qty 0.5

## 2012-09-23 MED ORDER — ONDANSETRON HCL 4 MG PO TABS
4.0000 mg | ORAL_TABLET | Freq: Four times a day (QID) | ORAL | Status: DC | PRN
  Filled 2012-09-23: qty 1

## 2012-09-23 MED ORDER — SORBITOL 70 % SOLN
30.0000 mL | Freq: Every day | Status: DC | PRN
  Filled 2012-09-23: qty 30

## 2012-09-23 NOTE — ED Provider Notes (Signed)
History     CSN: [id number]  Arrival date & time 09/23/12  7829   First MD Initiated Contact with Patient 09/23/12 [phone]      Chief Complaint  Patient presents with  . Weakness    (Consider location/radiation/quality/duration/timing/severity/associated sxs/prior treatment) Patient is a 79 y.o. male presenting with weakness. The history is provided by the patient, the spouse and a relative. No language interpreter was used.  Weakness This is a new problem. The current episode started 1 to 4 weeks ago. The problem occurs constantly. The problem has been gradually worsening. Associated symptoms include abdominal pain and weakness. Pertinent negatives include no chest pain, fever, nausea or vomiting. Associated symptoms comments: Patient with a history of prostate cancer and anemia requiring recent transfusion who is cared for at home by wife comes for evaluation of worsening weakness over the last 2 weeks, now with increasing frequency of falls, poor appetite and 30-pound weight loss in the last 3-4 weeks. The patient denies pain other than "indigestion" with meals that prevents him from eating. No pain currently. Weakness has been generalized. Per his wife and daughter, his speech has been slurred also. He has a waxing and waning confusion during the same 2-4 week period..    Past Medical History  Diagnosis Date  . Venous insufficiency     His diagnosis this is a note on Roy Blackwell he was diagnosed as having chronic thrombophlebitis when he was just in back in the 50s and has seen numerous surgeons Dr. Nedra Hai Dr. 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 Dr. Genelle Bal he continued his Coumadin management but he has no history of atri  . Hypertrophic cardiomyopathy   . Prostate cancer 08/27/2011  . Hypertension 08/27/2011  . Pulmonary nodule 08/27/2011    2008  . Hyperlipidemia 08/27/2011  .  Inguinal lymphadenopathy 08/31/2011    Bulky bilat 2009 CT  . Warfarin anticoagulation 08/31/2011  . Hydronephrosis 09/01/2011  . CKD (chronic kidney disease) 09/01/2011  . Abdominal lymphadenopathy 09/01/2011    History reviewed. No pertinent past surgical history.  History reviewed. No pertinent family history.  History  Substance Use Topics  . Smoking status: Current Every Day Smoker -- 0.50 packs/day  . Smokeless tobacco: Never Used  . Alcohol Use: No      Review of Systems  Constitutional: Positive for activity change, appetite change and unexpected weight change. Negative for fever.  Eyes: Negative for visual disturbance.  Respiratory: Negative for shortness of breath.   Cardiovascular: Negative for chest pain.  Gastrointestinal: Positive for abdominal pain. Negative for nausea, vomiting and diarrhea.  Genitourinary:       No bowel or bladder incontinence.  Neurological: Positive for speech difficulty and weakness. Negative for seizures and syncope.  Psychiatric/Behavioral: Positive for confusion.    Allergies  Review of patient's allergies indicates no known allergies.  Home Medications   Current Outpatient Rx  Name  Route  Sig  Dispense  Refill  . acetaminophen (TYLENOL) 500 MG tablet   Oral   Take 500 mg by mouth every 8 (eight) hours as needed for pain. For pain.         . Ascorbic Acid (VITAMIN C PO)   Oral   Take by mouth daily.         . bicalutamide (CASODEX) 50 MG tablet   Oral   Take 50 mg by mouth daily.         Marland Kitchen  Cholecalciferol (VITAMIN D PO)   Oral   Take by mouth daily.         Marland Kitchen sulfamethoxazole-trimethoprim (BACTRIM DS,SEPTRA DS) 800-160 MG per tablet   Oral   Take 1 tablet by mouth 2 (two) times daily.          Marland Kitchen warfarin (COUMADIN) 5 MG tablet      Take as directed by anticoagulation clinic   60 tablet   3     BP 123/48  Pulse 65  Temp(Src) 98 F (36.7 C) (Oral)  Resp 21  SpO2 96%  Physical Exam  Constitutional: He  appears well-developed and well-nourished. No distress.  Frail appearing elderly gentleman.  HENT:  Head: Normocephalic.  Mouth/Throat: Mucous membranes are dry.  Eyes: Pupils are equal, round, and reactive to light.  Significantly pale conjunctiva.  Neck: Normal range of motion. Neck supple.  Pulmonary/Chest: Effort normal. He has no wheezes. He has no rales.  Abdominal: Soft. Bowel sounds are normal. There is no tenderness. There is no rebound and no guarding.  Musculoskeletal: Normal range of motion. He exhibits no edema.  Neurological:  Moves all extremities. Answer questions appropriately with an understanding of question. Speech is slow, difficult. Normal coordination in slow gross motor movements.   Skin: Skin is warm.  Psychiatric: He has a normal mood and affect.    ED Course  Procedures (including critical care time)  Labs Reviewed  URINE CULTURE  CBC WITH DIFFERENTIAL  COMPREHENSIVE METABOLIC PANEL  URINALYSIS, ROUTINE W REFLEX MICROSCOPIC  TYPE AND SCREEN   Results for orders placed during the hospital encounter of 09/23/12  CBC WITH DIFFERENTIAL      Result Value Range   WBC 12.4 (*) 4.0 - 10.5 K/uL   RBC 1.95 (*) 4.22 - 5.81 MIL/uL   Hemoglobin 6.0 (*) 13.0 - 17.0 g/dL   HCT 16.1 (*) 09.6 - 04.5 %   MCV 93.3  78.0 - 100.0 fL   MCH 30.8  26.0 - 34.0 pg   MCHC 33.0  30.0 - 36.0 g/dL   RDW 40.9 (*) 81.1 - 91.4 %   Platelets 321  150 - 400 K/uL   Neutrophils Relative % 62  43 - 77 %   Lymphocytes Relative 25  12 - 46 %   Monocytes Relative 11  3 - 12 %   Eosinophils Relative 1  0 - 5 %   Basophils Relative 1  0 - 1 %   Neutro Abs 7.7  1.7 - 7.7 K/uL   Lymphs Abs 3.1  0.7 - 4.0 K/uL   Monocytes Absolute 1.4 (*) 0.1 - 1.0 K/uL   Eosinophils Absolute 0.1  0.0 - 0.7 K/uL   Basophils Absolute 0.1  0.0 - 0.1 K/uL   RBC Morphology TARGET CELLS     WBC Morphology TOXIC GRANULATION    COMPREHENSIVE METABOLIC PANEL      Result Value Range   Sodium 125 (*) 135 -  145 mEq/L   Potassium 5.1  3.5 - 5.1 mEq/L   Chloride 91 (*) 96 - 112 mEq/L   CO2 20  19 - 32 mEq/L   Glucose, Bld 93  70 - 99 mg/dL   BUN 57 (*) 6 - 23 mg/dL   Creatinine, Ser 7.82 (*) 0.50 - 1.35 mg/dL   Calcium 95.6 (*) 8.4 - 10.5 mg/dL   Total Protein 6.1  6.0 - 8.3 g/dL   Albumin 2.2 (*) 3.5 - 5.2 g/dL   AST 22  0 -  37 U/L   ALT 6  0 - 53 U/L   Alkaline Phosphatase 127 (*) 39 - 117 U/L   Total Bilirubin 0.2 (*) 0.3 - 1.2 mg/dL   GFR calc non Af Amer 28 (*) >90 mL/min   GFR calc Af Amer 33 (*) >90 mL/min  URINALYSIS, ROUTINE W REFLEX MICROSCOPIC      Result Value Range   Color, Urine YELLOW  YELLOW   APPearance CLEAR  CLEAR   Specific Gravity, Urine 1.018  1.005 - 1.030   pH 5.0  5.0 - 8.0   Glucose, UA NEGATIVE  NEGATIVE mg/dL   Hgb urine dipstick NEGATIVE  NEGATIVE   Bilirubin Urine NEGATIVE  NEGATIVE   Ketones, ur NEGATIVE  NEGATIVE mg/dL   Protein, ur NEGATIVE  NEGATIVE mg/dL   Urobilinogen, UA 0.2  0.0 - 1.0 mg/dL   Nitrite NEGATIVE  NEGATIVE   Leukocytes, UA NEGATIVE  NEGATIVE  TROPONIN I      Result Value Range   Troponin I <0.30  <0.30 ng/mL  PROTIME-INR      Result Value Range   Prothrombin Time 13.9  11.6 - 15.2 seconds   INR 1.08  0.00 - 1.49  TYPE AND SCREEN      Result Value Range   ABO/RH(D) A POS     Antibody Screen NEG     Sample Expiration 09/26/2012     Unit Number [id number]     Blood Component Type RED CELLS,LR     Unit division 00     Status of Unit ALLOCATED     Transfusion Status OK TO TRANSFUSE     Crossmatch Result Compatible     Unit Number [id number]     Blood Component Type RED CELLS,LR     Unit division 00     Status of Unit ALLOCATED     Transfusion Status OK TO TRANSFUSE     Crossmatch Result Compatible    ABO/RH      Result Value Range   ABO/RH(D) A POS    PREPARE RBC (CROSSMATCH)      Result Value Range   Order Confirmation ORDER PROCESSED BY BLOOD BANK     Dg Chest Portable 1 View  09/23/2012   *RADIOLOGY  REPORT*  Clinical Data: Weakness.  History of hypertension.  PORTABLE CHEST - 1 VIEW  Comparison: 09/16/2006  Findings: Artifact overlies the chest.  The heart is enlarged. There is venous hypertension.  There may be early interstitial edema.  There is volume loss in the lower lobes, left more than right.  Basilar pneumonia is not excluded.  There appear to be old healed rib fractures.  There has been previous vertebral augmentation in the lower thoracic region. Sclerotic foci are seen within the skeleton consistent with progression of blastic metastatic disease (prostate cancer).  IMPRESSION: Possible fluid overload/early interstitial edema.  Density in the lower lobes left worse than right that could be atelectasis or pneumonia.  Sclerotic foci evident in the skeleton progressive since the previous studies consistent with blastic metastases related to prostate cancer.   Original Report Authenticated By: Paulina Fusi, M.D.   No results found.   No diagnosis found.  1. Hypercalcemia 2. Anemia requiring transfusion 3. Weakness 4. History prostate cancer  MDM  Patient condition remains unchanged. Stable. Admit with transfusion started in ED. Discussed with dr. Lavera Guise, [facility].         Arnoldo Hooker, PA-C 09/23/12 1222

## 2012-09-23 NOTE — H&P (Signed)
[facility] History and Physical  QUILL GRINDER NWG:[id number] DOB: Oct 11, 1928 DOA: 09/23/2012  Referring physician: Emergency room PCP: [name], MD  Specialists: Oncology - Dr. Clelia Croft Urology Dr. Mena Goes  Chief Complaint:  Chief Complaint  Patient presents with  . Weakness     HPI: Roy Blackwell is a 79 y.o. male with stage IV prostate cancer, metastatic to the bones, abdomen and pelvis, severe anemia, history of deep vein thromboses previously on Coumadin, was brought to the hospital by family after a fall at home. He has been losing weight for the past months, getting progressively weaker, obtunded, unable to eat or drink. In emergency room he was found to have severe anemia, hypercalcemia and a C-spine pathological fracture. Dr. Preston Fleeting from the emergency room has spoken with the neurosurgeon on call Dr. Tressie Stalker, and the patient will be placed on a hard cervical collar without plans for emergent neurosurgery.    Review of Systems:  The patient is fairly lethargic and most of the history is obtained from his wife and son.  Patient has hearing loss, weight loss, wears glasses, has chronic lower extremity wounds for the past 40 years from chronic venous stasis,  The patient denies  fever,  hoarseness, chest pain, syncope, dyspnea at rest,  hemoptysis, abdominal pain, melena, hematochezia, severe indigestion/heartburn, hematuria, incontinence, genital sores, muscle weakness, suspicious skin lesions.    Past Medical History  Diagnosis Date  . Venous insufficiency     His diagnosis this is a note on Roy Blackwell he was diagnosed as having chronic thrombophlebitis when he was just in back in the 50s and has seen numerous surgeons Dr. Nedra Hai Dr. 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 Dr. Genelle Bal he continued his Coumadin management but he has no history of atri  .  Hypertrophic cardiomyopathy   . Prostate cancer 08/27/2011  . Hypertension 08/27/2011  . Pulmonary nodule 08/27/2011    2008  . Hyperlipidemia 08/27/2011  . Inguinal lymphadenopathy 08/31/2011    Bulky bilat 2009 CT  . Warfarin anticoagulation 08/31/2011  . Hydronephrosis 09/01/2011  . CKD (chronic kidney disease) 09/01/2011  . Abdominal lymphadenopathy 09/01/2011   History reviewed. No pertinent past surgical history. Social History:  reports that he has been smoking.  He has never used smokeless tobacco. He reports that he does not drink alcohol or use illicit drugs.   No Known Allergies  Family history is unknown as the patient has left [place] about 60 years ago and has not kept in touch with his family  Prior to Admission medications   Medication Sig Start Date End Date Taking? Authorizing Provider  acetaminophen (TYLENOL) 500 MG tablet Take 500 mg by mouth every 8 (eight) hours as needed for pain. For pain.   Yes Historical Provider, MD  Ascorbic Acid (VITAMIN C PO) Take by mouth daily.   Yes Historical Provider, MD  bicalutamide (CASODEX) 50 MG tablet Take 50 mg by mouth daily.   Yes Historical Provider, MD  Cholecalciferol (VITAMIN D PO) Take by mouth daily.   Yes Historical Provider, MD  sulfamethoxazole-trimethoprim (BACTRIM DS,SEPTRA DS) 800-160 MG per tablet Take 1 tablet by mouth 2 (two) times daily.    Yes Historical Provider, MD  warfarin (COUMADIN) 5 MG tablet Take as directed by anticoagulation clinic 05/24/12   Herby Abraham, MD   Physical Exam: Filed Vitals:   09/23/12 1410 09/23/12  1415 09/23/12 1430 09/23/12 1445  BP: 126/48 103/51 123/36 132/41  Pulse: 63 60 66 95  Temp: 98.6 F (37 C)  98.4 F (36.9 C) 98.7 F (37.1 C)  TempSrc: Oral  Oral Oral  Resp: 20 16 22 23   SpO2: 92% 95% 90% 95%   Patient Vitals for the past 24 hrs:  BP Temp Temp src Pulse Resp SpO2  09/23/12 1445 132/41 mmHg 98.7 F (37.1 C) Oral 95 23 95 %  09/23/12 1430 123/36 mmHg 98.4 F (36.9 C)  Oral 66 22 90 %  09/23/12 1415 103/51 mmHg - - 60 16 95 %  09/23/12 1410 126/48 mmHg 98.6 F (37 C) Oral 63 20 92 %  09/23/12 1356 106/38 mmHg - - 63 14 93 %  09/23/12 1217 - 98 F (36.7 C) Rectal - - -  09/23/12 1215 144/36 mmHg - - 61 - 90 %  09/23/12 1145 125/42 mmHg - - 61 18 91 %  09/23/12 1115 132/43 mmHg - - 62 21 96 %  09/23/12 1045 118/46 mmHg - - 61 21 96 %  09/23/12 1015 124/43 mmHg - - 59 17 97 %  09/23/12 0945 119/42 mmHg - - 63 17 95 %  09/23/12 0940 123/48 mmHg 98 F (36.7 C) Oral 65 21 96 %      General:  Alert, oriented to family and to place but not fully oriented to situation or date  Eyes: Pupils are 3 mm symmetric and reactive to light  ENT: Dry oral mucosa  Neck: With hard cervical collar  Cardiovascular: Regular rate and rhythm without murmurs rubs or gallops  Respiratory: Clear bilaterally without wheezes rhonchi crackles  Abdomen: Excavated , nontender  Skin: Dry  Musculoskeletal: Tender over the entire spine  Psychiatric: Depressed and withdrawn  Neurologic: Strength 5 out of 5 in upper extremities bilaterally, strength 5 out of 5 in lower extremity on the right, strength 4/5 in the left lower extremity  Labs on Admission:  Basic Metabolic Panel:  Recent Labs Lab 09/23/12 1024  NA 125*  K 5.1  CL 91*  CO2 20  GLUCOSE 93  BUN 57*  CREATININE 2.09*  CALCIUM 15.7*   Liver Function Tests:  Recent Labs Lab 09/23/12 1024  AST 22  ALT 6  ALKPHOS 127*  BILITOT 0.2*  PROT 6.1  ALBUMIN 2.2*   No results found for this basename: LIPASE, AMYLASE,  in the last 168 hours No results found for this basename: AMMONIA,  in the last 168 hours CBC:  Recent Labs Lab 09/23/12 1024  WBC 12.4*  NEUTROABS 7.7  HGB 6.0*  HCT 18.2*  MCV 93.3  PLT 321   Cardiac Enzymes:  Recent Labs Lab 09/23/12 1024  TROPONINI <0.30    BNP (last 3 results) No results found for this basename: PROBNP,  in the last 8760 hours CBG: No results  found for this basename: GLUCAP,  in the last 168 hours  Radiological Exams on Admission: Ct Head Wo Contrast  09/23/2012   *RADIOLOGY REPORT*  Clinical Data: Several falls of the last several weeks with confusion, history of prostate cancer  CT HEAD WITHOUT CONTRAST CT CERVICAL SPINE WITHOUT CONTRAST  Technique:  Multidetector CT imaging of the head and cervical spine was performed following the standard protocol without intravenous contrast.  Multiplanar CT image reconstructions of the cervical spine were also generated.  Comparison:  Head CT - 09/16/2006; chest CT - 09/16/2006  CT HEAD  Findings:  The examination is degraded  secondary to patient motion artifact necessitating additional images.  In particular, there is suboptimal evaluation of the base of the skull.  Grossly unchanged findings of mild diffuse atrophy with stable diffuse sulcal prominence.  Grossly unchanged mild centralized volume loss with corresponding ex vacuo dilatation of the ventricular system.  Scattered periventricular hypodensities compatible with microvascular ischemic disease.  Old lacunar infarcts within the bilateral basal ganglia.  Given the degradation of the examination and extensive background parenchymal abnormalities, there is no CT evidence of acute large territory infarct.  No definite intraparenchymal or extra-axial mass or hemorrhage.  Unchanged size configuration of the ventricles and basilar cisterns.  No midline shift.  Minimal mucosal thickening within the left sphenoid sinus.  The remaining paranasal sinuses and mastoid air cells are normally aerated.  Regional soft tissues are normal.  No definite displaced calvarial fracture.  IMPRESSION: 1.  Degraded examination without definite acute intracranial process.  If clinical concern persists, further evaluation with repeat head CT when the patient better able to tolerate the examination is recommended. 2. Grossly stable findings of advanced atrophy and microvascular  ischemic disease.  CT CERVICAL SPINE  Findings:  C1 to the superior endplate of T2 is imaged.  There is increased mottled sclerosis most conspicuous involving the T2 vertebral body with additional ill-defined areas of sclerosis within the left lateral aspect of the C7 and C9 T1 vertebral bodies, worrisome for osseous metastatic disease.  There is mottled sclerosis involving the posterior medial aspect of several of the imaged superior ribs, most conspicuously involving the posterior aspect of the left sided second rib.  There is a comminuted displaced likely pathologic fracture involving the right lateral aspect of the arch of C1 with associated posterior lateral displacement of the right side of the arch of C1 and left lateral deviation of the left side of the arch of C1.  These findings are associated with cranial migration of the dens through the foramen magnum.  There is an approximate 0.8 x 0.6 cm displaced osseous fragment seen adjacent to the base of the dens within the spinal canal (image 14, series four).  There is an age indeterminate avulsion fracture involving the spinous process of the T1 vertebral body.  Calcifications within the bilateral carotid bulbs.  Limited visualization of the lung eighty suggests a right apical approximately 1.3 x 0.9 cm nodule (image 53, series five).  IMPRESSION: 1.  Findings worrisome for extensive osseous metastatic disease to the cervical spine, most conspicuously involving the T2 vertebral body. 2.   Comminuted, displaced, likely pathologic fracture involving primarily the right lateral ring of C1 with associated displacement and cranial migration of the dens through the foramen magnum. There is an approximately 0.8 cm displaced osseous fragment adjacent to the base of the dens within the spinal canal (image 14, series four).  3.  Age indeterminate avulsion fracture involving the spinous process of the T1 vertebral body.  4.  Osseous metastatic disease involving the  medial aspect of the imaged bilateral superior ribs, most conspicuously involving the posterior medial aspect of the left 2nd rib.  5.  Indeterminate approximately 1.3 cm nodule within the imaged right lung apex.  Above findings discussed with Dr. Preston Fleeting at 13 28.   Original Report Authenticated By: Tacey Ruiz, MD   Ct Cervical Spine Wo Contrast  09/23/2012   *RADIOLOGY REPORT*  Clinical Data: Several falls of the last several weeks with confusion, history of prostate cancer  CT HEAD WITHOUT CONTRAST CT CERVICAL SPINE WITHOUT CONTRAST  Technique:  Multidetector CT imaging of the head and cervical spine was performed following the standard protocol without intravenous contrast.  Multiplanar CT image reconstructions of the cervical spine were also generated.  Comparison:  Head CT - 09/16/2006; chest CT - 09/16/2006  CT HEAD  Findings:  The examination is degraded secondary to patient motion artifact necessitating additional images.  In particular, there is suboptimal evaluation of the base of the skull.  Grossly unchanged findings of mild diffuse atrophy with stable diffuse sulcal prominence.  Grossly unchanged mild centralized volume loss with corresponding ex vacuo dilatation of the ventricular system.  Scattered periventricular hypodensities compatible with microvascular ischemic disease.  Old lacunar infarcts within the bilateral basal ganglia.  Given the degradation of the examination and extensive background parenchymal abnormalities, there is no CT evidence of acute large territory infarct.  No definite intraparenchymal or extra-axial mass or hemorrhage.  Unchanged size configuration of the ventricles and basilar cisterns.  No midline shift.  Minimal mucosal thickening within the left sphenoid sinus.  The remaining paranasal sinuses and mastoid air cells are normally aerated.  Regional soft tissues are normal.  No definite displaced calvarial fracture.  IMPRESSION: 1.  Degraded examination without definite  acute intracranial process.  If clinical concern persists, further evaluation with repeat head CT when the patient better able to tolerate the examination is recommended. 2. Grossly stable findings of advanced atrophy and microvascular ischemic disease.  CT CERVICAL SPINE  Findings:  C1 to the superior endplate of T2 is imaged.  There is increased mottled sclerosis most conspicuous involving the T2 vertebral body with additional ill-defined areas of sclerosis within the left lateral aspect of the C7 and C9 T1 vertebral bodies, worrisome for osseous metastatic disease.  There is mottled sclerosis involving the posterior medial aspect of several of the imaged superior ribs, most conspicuously involving the posterior aspect of the left sided second rib.  There is a comminuted displaced likely pathologic fracture involving the right lateral aspect of the arch of C1 with associated posterior lateral displacement of the right side of the arch of C1 and left lateral deviation of the left side of the arch of C1.  These findings are associated with cranial migration of the dens through the foramen magnum.  There is an approximate 0.8 x 0.6 cm displaced osseous fragment seen adjacent to the base of the dens within the spinal canal (image 14, series four).  There is an age indeterminate avulsion fracture involving the spinous process of the T1 vertebral body.  Calcifications within the bilateral carotid bulbs.  Limited visualization of the lung eighty suggests a right apical approximately 1.3 x 0.9 cm nodule (image 53, series five).  IMPRESSION: 1.  Findings worrisome for extensive osseous metastatic disease to the cervical spine, most conspicuously involving the T2 vertebral body. 2.   Comminuted, displaced, likely pathologic fracture involving primarily the right lateral ring of C1 with associated displacement and cranial migration of the dens through the foramen magnum. There is an approximately 0.8 cm displaced osseous  fragment adjacent to the base of the dens within the spinal canal (image 14, series four).  3.  Age indeterminate avulsion fracture involving the spinous process of the T1 vertebral body.  4.  Osseous metastatic disease involving the medial aspect of the imaged bilateral superior ribs, most conspicuously involving the posterior medial aspect of the left 2nd rib.  5.  Indeterminate approximately 1.3 cm nodule within the imaged right lung apex.  Above findings discussed with Dr.  Glick at 13 28.   Original Report Authenticated By: Tacey Ruiz, MD   Dg Chest Portable 1 View  09/23/2012   *RADIOLOGY REPORT*  Clinical Data: Weakness.  History of hypertension.  PORTABLE CHEST - 1 VIEW  Comparison: 09/16/2006  Findings: Artifact overlies the chest.  The heart is enlarged. There is venous hypertension.  There may be early interstitial edema.  There is volume loss in the lower lobes, left more than right.  Basilar pneumonia is not excluded.  There appear to be old healed rib fractures.  There has been previous vertebral augmentation in the lower thoracic region. Sclerotic foci are seen within the skeleton consistent with progression of blastic metastatic disease (prostate cancer).  IMPRESSION: Possible fluid overload/early interstitial edema.  Density in the lower lobes left worse than right that could be atelectasis or pneumonia.  Sclerotic foci evident in the skeleton progressive since the previous studies consistent with blastic metastases related to prostate cancer.   Original Report Authenticated By: Paulina Fusi, M.D.    EKG: Independently reviewed. Sinus rhythm, LVH, repolarization changes  Assessment/Plan Principal Problem:   Hypercalcemia Active Problems:   Hypertrophic obstructive cardiomyopathy   Deep vein thrombosis (DVT)   Chronic venous hypertension due to DVT with ulcer and inflammation   Prostate cancer   Hypertension   Hyperlipidemia   Spine metastasis   Unspecified constipation   Anemia    Acute renal failure (ARF)   CKD stage G3b/A1, GFR 30 - 44 and albumin creatinine ratio <30 mg/g   Malnutrition of moderate degree   Closed C1 fracture   Hyponatremia 1. Hypercalcemia - secondary to stage IV prostate cancer For now the patient is severely volume depleted so he'll require IV fluids and calcitonin. After he becomes euvolemic and volume repleted we will give him pamidronate IV.   2. Metastatic prostate cancer. PSA >1460 (08/31/2011) -> 623 (11/15/2011) -> 348 (03/18/2012) -> 587 (08/30/2012).  Followed by Dr. Mena Goes - On Firmagon/Casodex. On Xgeva for bone disease.  CT scan 08/31/2011 shows bulky intrathoracic and intraabdominal adenopathy. He also has a pathologic fx of L4. For another CT in July, 2014.  He had a IR bone tumor ablation 10/17/2011 by Dr. Corliss Skains.  Saw Dr. Clelia Croft (08/30/2012) - for metastatic prostate ca. I have called in oncology consult on May 26 so that the patient's regimen is adjusted emergently.   3. Bilateral chronic venous stasis ulcers, medially above his ankles. L>R  He grew out Pseudomonas on cultures in May 2012 and has been on Septra since then from [facility]. For now we will stop antibiotics until clinical status stabilizes. Wound care consult in the hospital  4 history of recurrent DVTs. The patient was On coumadin until his anemia has become too severe for treatment to be safe.  5. Hypertrophic cardiomyopathy.   6. Constipation  Treatment with Senokot, MiraLAX, Colace  7. severe protein caloric malnutrition Weight down about 24 pounds over the last 3 months  He weighed 145 in February, 2014.  Ensure 3 times a day    8. Anemia Hgb was 5.2 - 08/30/2012 and patient did received a unit of packed red blood cells in the [facility]. Hb i s6 today . The family denies active bleeding. Anemia is probably related to advanced malignancy process.  Patient to be transfused in hospital today. We will followup CBC tomorrow  9. cervical spine fracture  post fall -  Patient to be placed in hard cervical collar. Consultation for radiation therapy to  be given if oncology feels worthwhile trying.    10. Hyponatremia - ? Cause - ? Siadh. Check urine osmolarity   11. Acute on chronic renal failure-probably prerenal. Start IV fluids followup urine output   Code Status: full code per wife and son request Disposition Plan: home   Roger Fasnacht [facility] Pager [phone]  If 7PM-7AM, please contact night-coverage [email or web address] Password [facility] 09/23/2012, 3:03 PM

## 2012-09-23 NOTE — ED Notes (Signed)
Critical hgb 6.0- reported by lab Roy Blackwell at 1100.  Reported to [place] at 1100.

## 2012-09-23 NOTE — ED Notes (Signed)
Correction to prior entry at 1030-  Lab specimen of type and screen was collected by Philip Aspen.

## 2012-09-23 NOTE — ED Notes (Signed)
Portable chest x ray ordered and performed.

## 2012-09-23 NOTE — ED Notes (Signed)
Unable to assess lower extremities d/t wrapping in place that is changed by MD for DVT and pressure ulcers per wife report.  Will assess with ED MD.  Pulses in bilateral upper arm extremities 2+.

## 2012-09-23 NOTE — ED Notes (Signed)
Blood consent signed at bedside. Witnessed by [employer]

## 2012-09-23 NOTE — ED Notes (Signed)
No adverse reaction to blood to this point

## 2012-09-23 NOTE — ED Provider Notes (Deleted)
   Dione Booze, MD 09/23/12 1228

## 2012-09-23 NOTE — ED Notes (Signed)
Wife reports pt has been collapsing d/t weakness and decreased appetite.

## 2012-09-23 NOTE — ED Provider Notes (Addendum)
79 year old male with known prostate cancer comes in with no percussive weakness over the past week. Family has noted he has been confused and has had several falls at home. He did have a blood transfusion because of anemia about 4 weeks ago. On exam, he is somewhat lethargic and confused. Lungs are clear and heart is regular rate and rhythm. There no focal motor or sensory deficits. Hemoglobin has come back at 6.0, sodium 125, calcium 15.7,  and creatinine has risen to 2.09. He will need to be admitted for blood transfusion and treatment of hyper calcium and. Because of falls, CT scans or be obtained of head and cervical spine.   Date: 09/23/2012  Rate: 62  Rhythm: normal sinus rhythm  QRS Axis: normal  Intervals: normal  ST/T Wave abnormalities: T wave inversion in inferior and anterolateral leads  Conduction Disutrbances:none  Narrative Interpretation: Left ventricular hypertrophy with T-wave inversion secondary to LVH, probable old lateral wall myocardial infarction. When compared with ECG of 05/24/2012, no significant changes are seen.  Old EKG Reviewed: unchanged  Medical screening examination/treatment/procedure(s) were conducted as a shared visit with non-physician practitioner(s) and myself.  I personally evaluated the patient during the encounter   Dione Booze, MD 09/23/12 1145  CT has come back showing a stable fracture of C1. He has been placed in a stiff cervical collar and case is been discussed with the surgery. Dr. Lovell Sheehan feels that unless his cancer can be brought in remission, surgical management would be pointless and recommends that he be kept in a stiff cervical collar. Consider palliative radiation therapy to this area.  Dione Booze, MD 09/23/12 [phone]

## 2012-09-23 NOTE — ED Notes (Signed)
Wife reports that pt has started collapsing d/t weakness along with decreased appetite.  Pt received blood transfusion d/t anemia and is scheduled for another transfusion on 6/3.  Wife reports they have no means of helping pt get around at home expect by cane.

## 2012-09-23 NOTE — Consult Note (Signed)
WOC consult Note Reason for Consult:CHange dressings. Wound type:Bilateral medial malleolus ulcerations (chronic). Suspected etiology is venous insufficiency.   Caregiver states that "ulcers have been treated by Dr. Ezzard Standing on an outpatient basis for several months and that he sees him every other week on Wednesdays.  The ulcers have decreased dramatically in size." Pressure Ulcer POA: No Measurement:Left medial malleolus:  2cm x 1cm x 0.2cm; clean pink and non-healing/non-granulating.   Right medial malleolus:  5cm x 1cm x 0.2cm ; clean pink and non-granulating, with small area at distal end covered with slough measuring 2cm x 1cm. Wound bed:As described above. Drainage (amount, consistency, odor) scant amount on old dressings. No odor. Periwound:intact. Dressing procedure/placement/frequency: I will recommend a moisture retentive soft silicone foam dressing (Allevyn) to the ulcers while in the hospital and implement twice-weekly changes on Mondays and Thursdays.  Wound measurements are to be obtained on Thursdays. Patient is to follow up with Dr. Ezzard Standing upon discharge and resume his twice-monthly visits.   I will not follow, but will remain available to this patient and his nursing and medical teams.  Please re-consult if needed. Thanks, Ladona Mow, MSN, RN, [facility], [facility] [phone])

## 2012-09-23 NOTE — ED Notes (Signed)
Hospitalist at bedside 

## 2012-09-24 DIAGNOSIS — K5900 Constipation, unspecified: Secondary | ICD-10-CM

## 2012-09-24 DIAGNOSIS — S12000A Unspecified displaced fracture of first cervical vertebra, initial encounter for closed fracture: Secondary | ICD-10-CM

## 2012-09-24 DIAGNOSIS — C61 Malignant neoplasm of prostate: Secondary | ICD-10-CM

## 2012-09-24 LAB — TYPE AND SCREEN
ABO/RH(D): A POS
Antibody Screen: NEGATIVE
Unit division: 0
Unit division: 0

## 2012-09-24 LAB — COMPREHENSIVE METABOLIC PANEL
Albumin: 2.2 g/dL — ABNORMAL LOW (ref 3.5–5.2)
BUN: 57 mg/dL — ABNORMAL HIGH (ref 6–23)
CO2: 21 mEq/L (ref 19–32)
Calcium: 14.9 mg/dL (ref 8.4–10.5)
Creatinine, Ser: 2.09 mg/dL — ABNORMAL HIGH (ref 0.50–1.35)
Creatinine, Ser: 1.93 mg/dL — ABNORMAL HIGH (ref 0.50–1.35)
GFR calc Af Amer: 33 mL/min — ABNORMAL LOW (ref >90)
GFR calc Af Amer: 36 mL/min — ABNORMAL LOW (ref >90)
GFR calc non Af Amer: 31 mL/min — ABNORMAL LOW (ref >90)
Glucose, Bld: 93 mg/dL (ref 70–99)
Glucose, Bld: 80 mg/dL (ref 70–99)
Total Protein: 6.1 g/dL (ref 6.0–8.3)

## 2012-09-24 LAB — CBC
Hemoglobin: 9.1 g/dL — ABNORMAL LOW (ref 13.0–17.0)
RBC: 3.12 MIL/uL — ABNORMAL LOW (ref 4.22–5.81)

## 2012-09-24 LAB — URINE CULTURE: Colony Count: NO GROWTH

## 2012-09-24 MED ORDER — SODIUM CHLORIDE 0.9 % IV SOLN
60.0000 mg | Freq: Once | INTRAVENOUS | Status: AC
  Administered 2012-09-24: 60 mg via INTRAVENOUS
  Filled 2012-09-24: qty 20

## 2012-09-24 MED ORDER — BISACODYL 10 MG RE SUPP: 10.0000 mg | Freq: Every day | RECTAL | Status: DC | PRN

## 2012-09-24 MED ORDER — PANTOPRAZOLE SODIUM 40 MG IV SOLR
40.0000 mg | INTRAVENOUS | Status: DC
  Administered 2012-09-24 – 2012-09-25 (×2): 40 mg via INTRAVENOUS
  Filled 2012-09-24 (×3): qty 40

## 2012-09-24 NOTE — Progress Notes (Signed)
IP PROGRESS NOTE  Subjective:   Patient known to me with the following issues:  Principle Diagnosis: 79year old with hormone sensitive advanced prostate cancer diagnosed in 08/2011. His diagnosis made in 1992.  Prior Therapy: Radiation therapy in about 1992 (records unavailable to [place])  Current therapy: Hormone therapy in the form of Framagon under the care of [facility]. Patient states he was started on Casodex around early November 2013. He is also on Xgeva at [facility].  Patient was hospitalized on 5/26 due to falls and found to have hypercalcemia. He is S/P Pamidronate.  He is still confused and non verbal  Objective:  Vital signs in last 24 hours: Temp:  [98 F (36.7 C)-98.5 F (36.9 C)] 98.4 F (36.9 C) (05/27 1333) Pulse Rate:  [67-89] 74 (05/27 1333) Resp:  [18-22] 22 (05/27 1333) BP: (120-163)/(40-56) 120/53 mmHg (05/27 1333) SpO2:  [94 %-98 %] 98 % (05/27 1333) Weight:  [125 lb 14.1 oz (57.1 kg)-127 lb 10.3 oz (57.9 kg)] 125 lb 14.1 oz (57.1 kg) (05/27 0518) Weight change:  Last BM Date: 09/21/12  Intake/Output from previous day: 05/26 0701 - 05/27 0700 In: 350 [Blood:350] Out: -   Mouth: mucous membranes moist, pharynx normal without lesions Resp: clear to auscultation bilaterally Cardio: regular rate and rhythm, S1, S2 normal, no murmur, click, rub or gallop GI: soft, non-tender; bowel sounds normal; no masses,  no organomegaly Extremities: extremities normal, atraumatic, no cyanosis or edema  Portacath/PICC-without erythema  Lab Results:  Recent Labs  09/23/12 1024 09/23/12 2325 09/24/12 0355  WBC 12.4*  --  13.3*  HGB 6.0* 9.2* 9.1*  HCT 18.2* 26.7* 26.6*  PLT 321  --  310    BMET  Recent Labs  09/23/12 1024 09/24/12 0355  NA 125* 129*  K 5.1 4.5  CL 91* 95*  CO2 20 21  GLUCOSE 93 80  BUN 57* 55*  CREATININE 2.09* 1.93*  CALCIUM >15.0* 14.9*    Studies/Results: Ct Head Wo Contrast  09/23/2012   *RADIOLOGY REPORT*   Clinical Data: Several falls of the last several weeks with confusion, history of prostate cancer  CT HEAD WITHOUT CONTRAST CT CERVICAL SPINE WITHOUT CONTRAST  Technique:  Multidetector CT imaging of the head and cervical spine was performed following the standard protocol without intravenous contrast.  Multiplanar CT image reconstructions of the cervical spine were also generated.  Comparison:  Head CT - 09/16/2006; chest CT - 09/16/2006  CT HEAD  Findings:  The examination is degraded secondary to patient motion artifact necessitating additional images.  In particular, there is suboptimal evaluation of the base of the skull.  Grossly unchanged findings of mild diffuse atrophy with stable diffuse sulcal prominence.  Grossly unchanged mild centralized volume loss with corresponding ex vacuo dilatation of the ventricular system.  Scattered periventricular hypodensities compatible with microvascular ischemic disease.  Old lacunar infarcts within the bilateral basal ganglia.  Given the degradation of the examination and extensive background parenchymal abnormalities, there is no CT evidence of acute large territory infarct.  No definite intraparenchymal or extra-axial mass or hemorrhage.  Unchanged size configuration of the ventricles and basilar cisterns.  No midline shift.  Minimal mucosal thickening within the left sphenoid sinus.  The remaining paranasal sinuses and mastoid air cells are normally aerated.  Regional soft tissues are normal.  No definite displaced calvarial fracture.  IMPRESSION: 1.  Degraded examination without definite acute intracranial process.  If clinical concern persists, further evaluation with repeat head CT when the patient  better able to tolerate the examination is recommended. 2. Grossly stable findings of advanced atrophy and microvascular ischemic disease.  CT CERVICAL SPINE  Findings:  C1 to the superior endplate of T2 is imaged.  There is increased mottled sclerosis most conspicuous  involving the T2 vertebral body with additional ill-defined areas of sclerosis within the left lateral aspect of the C7 and C9 T1 vertebral bodies, worrisome for osseous metastatic disease.  There is mottled sclerosis involving the posterior medial aspect of several of the imaged superior ribs, most conspicuously involving the posterior aspect of the left sided second rib.  There is a comminuted displaced likely pathologic fracture involving the right lateral aspect of the arch of C1 with associated posterior lateral displacement of the right side of the arch of C1 and left lateral deviation of the left side of the arch of C1.  These findings are associated with cranial migration of the dens through the foramen magnum.  There is an approximate 0.8 x 0.6 cm displaced osseous fragment seen adjacent to the base of the dens within the spinal canal (image 14, series four).  There is an age indeterminate avulsion fracture involving the spinous process of the T1 vertebral body.  Calcifications within the bilateral carotid bulbs.  Limited visualization of the lung eighty suggests a right apical approximately 1.3 x 0.9 cm nodule (image 53, series five).  IMPRESSION: 1.  Findings worrisome for extensive osseous metastatic disease to the cervical spine, most conspicuously involving the T2 vertebral body. 2.   Comminuted, displaced, likely pathologic fracture involving primarily the right lateral ring of C1 with associated displacement and cranial migration of the dens through the foramen magnum. There is an approximately 0.8 cm displaced osseous fragment adjacent to the base of the dens within the spinal canal (image 14, series four).  3.  Age indeterminate avulsion fracture involving the spinous process of the T1 vertebral body.  4.  Osseous metastatic disease involving the medial aspect of the imaged bilateral superior ribs, most conspicuously involving the posterior medial aspect of the left 2nd rib.  5.  Indeterminate  approximately 1.3 cm nodule within the imaged right lung apex.  Above findings discussed with Dr. Preston Fleeting at 13 28.   Original Report Authenticated By: Tacey Ruiz, MD   Ct Cervical Spine Wo Contrast  09/23/2012   *RADIOLOGY REPORT*  Clinical Data: Several falls of the last several weeks with confusion, history of prostate cancer  CT HEAD WITHOUT CONTRAST CT CERVICAL SPINE WITHOUT CONTRAST  Technique:  Multidetector CT imaging of the head and cervical spine was performed following the standard protocol without intravenous contrast.  Multiplanar CT image reconstructions of the cervical spine were also generated.  Comparison:  Head CT - 09/16/2006; chest CT - 09/16/2006  CT HEAD  Findings:  The examination is degraded secondary to patient motion artifact necessitating additional images.  In particular, there is suboptimal evaluation of the base of the skull.  Grossly unchanged findings of mild diffuse atrophy with stable diffuse sulcal prominence.  Grossly unchanged mild centralized volume loss with corresponding ex vacuo dilatation of the ventricular system.  Scattered periventricular hypodensities compatible with microvascular ischemic disease.  Old lacunar infarcts within the bilateral basal ganglia.  Given the degradation of the examination and extensive background parenchymal abnormalities, there is no CT evidence of acute large territory infarct.  No definite intraparenchymal or extra-axial mass or hemorrhage.  Unchanged size configuration of the ventricles and basilar cisterns.  No midline shift.  Minimal mucosal thickening  within the left sphenoid sinus.  The remaining paranasal sinuses and mastoid air cells are normally aerated.  Regional soft tissues are normal.  No definite displaced calvarial fracture.  IMPRESSION: 1.  Degraded examination without definite acute intracranial process.  If clinical concern persists, further evaluation with repeat head CT when the patient better able to tolerate the  examination is recommended. 2. Grossly stable findings of advanced atrophy and microvascular ischemic disease.  CT CERVICAL SPINE  Findings:  C1 to the superior endplate of T2 is imaged.  There is increased mottled sclerosis most conspicuous involving the T2 vertebral body with additional ill-defined areas of sclerosis within the left lateral aspect of the C7 and C9 T1 vertebral bodies, worrisome for osseous metastatic disease.  There is mottled sclerosis involving the posterior medial aspect of several of the imaged superior ribs, most conspicuously involving the posterior aspect of the left sided second rib.  There is a comminuted displaced likely pathologic fracture involving the right lateral aspect of the arch of C1 with associated posterior lateral displacement of the right side of the arch of C1 and left lateral deviation of the left side of the arch of C1.  These findings are associated with cranial migration of the dens through the foramen magnum.  There is an approximate 0.8 x 0.6 cm displaced osseous fragment seen adjacent to the base of the dens within the spinal canal (image 14, series four).  There is an age indeterminate avulsion fracture involving the spinous process of the T1 vertebral body.  Calcifications within the bilateral carotid bulbs.  Limited visualization of the lung eighty suggests a right apical approximately 1.3 x 0.9 cm nodule (image 53, series five).  IMPRESSION: 1.  Findings worrisome for extensive osseous metastatic disease to the cervical spine, most conspicuously involving the T2 vertebral body. 2.   Comminuted, displaced, likely pathologic fracture involving primarily the right lateral ring of C1 with associated displacement and cranial migration of the dens through the foramen magnum. There is an approximately 0.8 cm displaced osseous fragment adjacent to the base of the dens within the spinal canal (image 14, series four).  3.  Age indeterminate avulsion fracture involving the  spinous process of the T1 vertebral body.  4.  Osseous metastatic disease involving the medial aspect of the imaged bilateral superior ribs, most conspicuously involving the posterior medial aspect of the left 2nd rib.  5.  Indeterminate approximately 1.3 cm nodule within the imaged right lung apex.  Above findings discussed with Dr. Preston Fleeting at 13 28.   Original Report Authenticated By: Tacey Ruiz, MD   Dg Chest Portable 1 View  09/23/2012   *RADIOLOGY REPORT*  Clinical Data: Weakness.  History of hypertension.  PORTABLE CHEST - 1 VIEW  Comparison: 09/16/2006  Findings: Artifact overlies the chest.  The heart is enlarged. There is venous hypertension.  There may be early interstitial edema.  There is volume loss in the lower lobes, left more than right.  Basilar pneumonia is not excluded.  There appear to be old healed rib fractures.  There has been previous vertebral augmentation in the lower thoracic region. Sclerotic foci are seen within the skeleton consistent with progression of blastic metastatic disease (prostate cancer).  IMPRESSION: Possible fluid overload/early interstitial edema.  Density in the lower lobes left worse than right that could be atelectasis or pneumonia.  Sclerotic foci evident in the skeleton progressive since the previous studies consistent with blastic metastases related to prostate cancer.   Original Report Authenticated  By: Paulina Fusi, M.D.    Medications: I have reviewed the patient's current medications.  Assessment/Plan:   79 year old with:  1. Hypercalcemia: I agree with the current management. He is S/P pamidronate today. Too early to see benefit. This is likely due to cancer progression in the bone.   2. Prostate cancer. 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 He will likely need different therapy once he recovers from this episode. He is not a candidate for chemotherapy. We might consider Zytiga if he recovers fully as an out patient.   3.  Prognosis: overall poor. It depends if he recovers from this episode. If so, then we can offer him palliative treatment for his cancer. If he develops more complications, and is not improving in the next 24-48 hours then would consider more of a palliative care approach.   This was discussed with the family that was present at the bed side. All their questions were answered.    LOS: 1 day   [facility] 09/24/2012, 4:52 PM

## 2012-09-24 NOTE — Progress Notes (Signed)
[facility] PROGRESS NOTE  Roy Blackwell WUJ:[id number] DOB: March 09, 1929 DOA: 09/23/2012 PCP: [name], MD  Assessment/Plan: 1. Hypercalcemia - secondary to stage IV prostate cancer - the patient was placed on IV normal saline from the day of admission. He was started on subcutaneous calcitonin. By may the 27th we felt that he was euvolemic and he was prescribed pamidronate 60 mg IV x1. We'll continue IV fluids and monitor her calcium level closely.  2. Metastatic prostate cancer. PSA >1460 (08/31/2011) -> 623 (11/15/2011) -> 348 (03/18/2012) -> 587 (08/30/2012).  Followed by Dr. Mena Goes - On Firmagon/Casodex. On Xgeva for bone disease.  CT scan 08/31/2011 shows bulky intrathoracic and intraabdominal adenopathy. He also has a pathologic fx of L4. He had a IR bone tumor ablation 10/17/2011 by Dr. Corliss Skains. Saw Dr. Clelia Croft (08/30/2012) - for metastatic prostate ca. upon admission to [facility] May 27 a CT scan of the cervical spine indicates numerous blastic metastatic deposits.  I have called in oncology consult on May 26 so that the patient's regimen is adjusted emergently.  3. Bilateral chronic venous stasis ulcers, medially above his ankles. L>R  He grew out Pseudomonas on cultures in May 2012 and has been on Septra since then from [facility]. For now we did stop antibiotics until clinical status stabilizes. Wound care consult in the hospital was obtained 4 history of recurrent DVTs. The patient was On coumadin until his anemia has become too severe for treatment to be safe. Continue to observe off Coumadin. If no major bleeding is noted in hemoglobin remained stable we may resume Coumadin in the hospital. Currently the patient is on heparin 5000 units 3 times a day for DVT prophylaxis.  5. Hypertrophic cardiomyopathy. - Monitor volume status closely 6. Constipation  Treatment with Senokot, MiraLAX, Colace  7. severe protein caloric malnutrition  Weight down about 20 pounds over the last 3 months   He weighed 145 in February, 2014. Weight recorded on admission is 125 pounds Ensure 3 times a day prescribed on admission.  8. Anemia Hgb was 5.2 on  08/30/2012 and patient did received one unit of packed red blood cells in the [facility]. Hb was measured at 6 on the day of admission . The family denies noticing any active bleeding. Anemia is probably related to advanced prostate cancer infiltrating the bone marrow  Patient was transfused 2 units of packed red blood cells on May 26 with good response at the followup labs   9. C 1 cervical spine fracture post fall - Patient was placed in hard cervical collar on admission at the recommendation of Dr. Tressie Stalker for neurosurgery, on call on May 26. Consultation for radiation therapy to be considered if oncology feels worthwhile trying. On the morning of May 27 the patient was found with his cervical collar off after he took it off himself. Save considerable placed at the bedside so the patient cannot remove his cervical collar again.   10. Hyponatremia - ? Cause - combination of dehydration and Siadh. IV fluids with free water restriction. Continue to monitor  11. Acute on chronic renal failure-probably prerenal. Started IV fluids with mild improvement by May 27 . followup urine output and creatinine level.    12. Toxic metabolic encephalopathy from hypercalcemia - sitter at bedside  Code Status: Full code per family request Family Communication: Wife yesterday  Disposition Plan: Unclear   Consultants:  Oncology  Procedures:  CT cervical spine  Antibiotics:  HPI/Subjective: Confused, does not  know why he is in the hospital, denies pain  Objective: Filed Vitals:   09/23/12 2020 09/24/12 0217 09/24/12 0518 09/24/12 0544  BP: 123/40  163/48 153/48  Pulse: 68  71   Temp: 98.2 F (36.8 C)  98 F (36.7 C)   TempSrc: Oral  Oral   Resp: 20  18   Height:    5\' 6"  (1.676 m)  Weight:  57.9 kg (127 lb 10.3 oz) 57.1 kg  (125 lb 14.1 oz)   SpO2: 95%  94%    Patient Vitals for the past 24 hrs:  BP Temp Temp src Pulse Resp SpO2 Height Weight  09/24/12 0544 153/48 mmHg - - - - - 5\' 6"  (1.676 m) -  09/24/12 0518 163/48 mmHg 98 F (36.7 C) Oral 71 18 94 % - 57.1 kg (125 lb 14.1 oz)  09/24/12 0217 - - - - - - - 57.9 kg (127 lb 10.3 oz)  09/23/12 2020 123/40 mmHg 98.2 F (36.8 C) Oral 68 20 95 % - -  09/23/12 1945 129/42 mmHg 98.3 F (36.8 C) Oral 68 20 - - -  09/23/12 1845 122/56 mmHg 98.2 F (36.8 C) Oral 67 20 - - -  09/23/12 1745 149/41 mmHg 98.5 F (36.9 C) Oral 80 20 - - -  09/23/12 1700 132/45 mmHg 98.2 F (36.8 C) Oral 89 20 - - -  09/23/12 1630 135/45 mmHg - - 69 - - - -  09/23/12 1530 135/52 mmHg - - 79 - - - -  09/23/12 1506 - - - - - - - 56.7 kg (125 lb)  09/23/12 1445 132/41 mmHg 98.7 F (37.1 C) Oral 95 23 95 % - -  09/23/12 1430 123/36 mmHg 98.4 F (36.9 C) Oral 66 22 90 % - -  09/23/12 1415 103/51 mmHg - - 60 16 95 % - -  09/23/12 1410 126/48 mmHg 98.6 F (37 C) Oral 63 20 92 % - -  09/23/12 1356 106/38 mmHg - - 63 14 93 % - -  09/23/12 1217 - 98 F (36.7 C) Rectal - - - - -  09/23/12 1215 144/36 mmHg - - 61 - 90 % - -  09/23/12 1145 125/42 mmHg - - 61 18 91 % - -  09/23/12 1115 132/43 mmHg - - 62 21 96 % - -  09/23/12 1045 118/46 mmHg - - 61 21 96 % - -  09/23/12 1015 124/43 mmHg - - 59 17 97 % - -  09/23/12 0945 119/42 mmHg - - 63 17 95 % - -  09/23/12 0940 123/48 mmHg 98 F (36.7 C) Oral 65 21 96 % - -     Intake/Output Summary (Last 24 hours) at 09/24/12 0738 Last data filed at 09/23/12 1700  Gross per 24 hour  Intake    350 ml  Output      0 ml  Net    350 ml   Filed Weights   09/23/12 1506 09/24/12 0217 09/24/12 0518  Weight: 56.7 kg (125 lb) 57.9 kg (127 lb 10.3 oz) 57.1 kg (125 lb 14.1 oz)    Exam:   General:  Alert, not oriented x3  Cardiovascular: Regular rate and rhythm without murmurs rubs or gallops  Respiratory: Clear to auscultation  bilaterally  Abdomen: Excavated, nontender  Musculoskeletal: Tenderness over the spine  Neurological exam-cranial nerves 2-12 intact, strength 4/5 on the left side, strength 5 out of 5 on the right side   Data Reviewed: Basic  Metabolic Panel:  Recent Labs Lab 09/23/12 1024 09/24/12 0355  NA 125* 129*  K 5.1 4.5  CL 91* 95*  CO2 20 21  GLUCOSE 93 80  BUN 57* 55*  CREATININE 2.09* 1.93*  CALCIUM 15.7* 14.9*   Liver Function Tests:  Recent Labs Lab 09/23/12 1024 09/24/12 0355  AST 22 27  ALT 6 7  ALKPHOS 127* 137*  BILITOT 0.2* 0.3  PROT 6.1 6.1  ALBUMIN 2.2* 2.3*   No results found for this basename: LIPASE, AMYLASE,  in the last 168 hours No results found for this basename: AMMONIA,  in the last 168 hours CBC:  Recent Labs Lab 09/23/12 1024 09/23/12 2325 09/24/12 0355  WBC 12.4*  --  13.3*  NEUTROABS 7.7  --   --   HGB 6.0* 9.2* 9.1*  HCT 18.2* 26.7* 26.6*  MCV 93.3  --  85.3  PLT 321  --  310   Cardiac Enzymes:  Recent Labs Lab 09/23/12 1024  TROPONINI <0.30   BNP (last 3 results) No results found for this basename: PROBNP,  in the last 8760 hours CBG: No results found for this basename: GLUCAP,  in the last 168 hours  No results found for this or any previous visit (from the past 240 hour(s)).   Studies: Ct Head Wo Contrast  09/23/2012   *RADIOLOGY REPORT*  Clinical Data: Several falls of the last several weeks with confusion, history of prostate cancer  CT HEAD WITHOUT CONTRAST CT CERVICAL SPINE WITHOUT CONTRAST  Technique:  Multidetector CT imaging of the head and cervical spine was performed following the standard protocol without intravenous contrast.  Multiplanar CT image reconstructions of the cervical spine were also generated.  Comparison:  Head CT - 09/16/2006; chest CT - 09/16/2006  CT HEAD  Findings:  The examination is degraded secondary to patient motion artifact necessitating additional images.  In particular, there is suboptimal  evaluation of the base of the skull.  Grossly unchanged findings of mild diffuse atrophy with stable diffuse sulcal prominence.  Grossly unchanged mild centralized volume loss with corresponding ex vacuo dilatation of the ventricular system.  Scattered periventricular hypodensities compatible with microvascular ischemic disease.  Old lacunar infarcts within the bilateral basal ganglia.  Given the degradation of the examination and extensive background parenchymal abnormalities, there is no CT evidence of acute large territory infarct.  No definite intraparenchymal or extra-axial mass or hemorrhage.  Unchanged size configuration of the ventricles and basilar cisterns.  No midline shift.  Minimal mucosal thickening within the left sphenoid sinus.  The remaining paranasal sinuses and mastoid air cells are normally aerated.  Regional soft tissues are normal.  No definite displaced calvarial fracture.  IMPRESSION: 1.  Degraded examination without definite acute intracranial process.  If clinical concern persists, further evaluation with repeat head CT when the patient better able to tolerate the examination is recommended. 2. Grossly stable findings of advanced atrophy and microvascular ischemic disease.  CT CERVICAL SPINE  Findings:  C1 to the superior endplate of T2 is imaged.  There is increased mottled sclerosis most conspicuous involving the T2 vertebral body with additional ill-defined areas of sclerosis within the left lateral aspect of the C7 and C9 T1 vertebral bodies, worrisome for osseous metastatic disease.  There is mottled sclerosis involving the posterior medial aspect of several of the imaged superior ribs, most conspicuously involving the posterior aspect of the left sided second rib.  There is a comminuted displaced likely pathologic fracture involving the right lateral aspect  of the arch of C1 with associated posterior lateral displacement of the right side of the arch of C1 and left lateral deviation  of the left side of the arch of C1.  These findings are associated with cranial migration of the dens through the foramen magnum.  There is an approximate 0.8 x 0.6 cm displaced osseous fragment seen adjacent to the base of the dens within the spinal canal (image 14, series four).  There is an age indeterminate avulsion fracture involving the spinous process of the T1 vertebral body.  Calcifications within the bilateral carotid bulbs.  Limited visualization of the lung eighty suggests a right apical approximately 1.3 x 0.9 cm nodule (image 53, series five).  IMPRESSION: 1.  Findings worrisome for extensive osseous metastatic disease to the cervical spine, most conspicuously involving the T2 vertebral body. 2.   Comminuted, displaced, likely pathologic fracture involving primarily the right lateral ring of C1 with associated displacement and cranial migration of the dens through the foramen magnum. There is an approximately 0.8 cm displaced osseous fragment adjacent to the base of the dens within the spinal canal (image 14, series four).  3.  Age indeterminate avulsion fracture involving the spinous process of the T1 vertebral body.  4.  Osseous metastatic disease involving the medial aspect of the imaged bilateral superior ribs, most conspicuously involving the posterior medial aspect of the left 2nd rib.  5.  Indeterminate approximately 1.3 cm nodule within the imaged right lung apex.  Above findings discussed with Dr. Preston Fleeting at 13 28.   Original Report Authenticated By: Tacey Ruiz, MD   Ct Cervical Spine Wo Contrast  09/23/2012   *RADIOLOGY REPORT*  Clinical Data: Several falls of the last several weeks with confusion, history of prostate cancer  CT HEAD WITHOUT CONTRAST CT CERVICAL SPINE WITHOUT CONTRAST  Technique:  Multidetector CT imaging of the head and cervical spine was performed following the standard protocol without intravenous contrast.  Multiplanar CT image reconstructions of the cervical spine  were also generated.  Comparison:  Head CT - 09/16/2006; chest CT - 09/16/2006  CT HEAD  Findings:  The examination is degraded secondary to patient motion artifact necessitating additional images.  In particular, there is suboptimal evaluation of the base of the skull.  Grossly unchanged findings of mild diffuse atrophy with stable diffuse sulcal prominence.  Grossly unchanged mild centralized volume loss with corresponding ex vacuo dilatation of the ventricular system.  Scattered periventricular hypodensities compatible with microvascular ischemic disease.  Old lacunar infarcts within the bilateral basal ganglia.  Given the degradation of the examination and extensive background parenchymal abnormalities, there is no CT evidence of acute large territory infarct.  No definite intraparenchymal or extra-axial mass or hemorrhage.  Unchanged size configuration of the ventricles and basilar cisterns.  No midline shift.  Minimal mucosal thickening within the left sphenoid sinus.  The remaining paranasal sinuses and mastoid air cells are normally aerated.  Regional soft tissues are normal.  No definite displaced calvarial fracture.  IMPRESSION: 1.  Degraded examination without definite acute intracranial process.  If clinical concern persists, further evaluation with repeat head CT when the patient better able to tolerate the examination is recommended. 2. Grossly stable findings of advanced atrophy and microvascular ischemic disease.  CT CERVICAL SPINE  Findings:  C1 to the superior endplate of T2 is imaged.  There is increased mottled sclerosis most conspicuous involving the T2 vertebral body with additional ill-defined areas of sclerosis within the left lateral aspect of the  C7 and C9 T1 vertebral bodies, worrisome for osseous metastatic disease.  There is mottled sclerosis involving the posterior medial aspect of several of the imaged superior ribs, most conspicuously involving the posterior aspect of the left sided  second rib.  There is a comminuted displaced likely pathologic fracture involving the right lateral aspect of the arch of C1 with associated posterior lateral displacement of the right side of the arch of C1 and left lateral deviation of the left side of the arch of C1.  These findings are associated with cranial migration of the dens through the foramen magnum.  There is an approximate 0.8 x 0.6 cm displaced osseous fragment seen adjacent to the base of the dens within the spinal canal (image 14, series four).  There is an age indeterminate avulsion fracture involving the spinous process of the T1 vertebral body.  Calcifications within the bilateral carotid bulbs.  Limited visualization of the lung eighty suggests a right apical approximately 1.3 x 0.9 cm nodule (image 53, series five).  IMPRESSION: 1.  Findings worrisome for extensive osseous metastatic disease to the cervical spine, most conspicuously involving the T2 vertebral body. 2.   Comminuted, displaced, likely pathologic fracture involving primarily the right lateral ring of C1 with associated displacement and cranial migration of the dens through the foramen magnum. There is an approximately 0.8 cm displaced osseous fragment adjacent to the base of the dens within the spinal canal (image 14, series four).  3.  Age indeterminate avulsion fracture involving the spinous process of the T1 vertebral body.  4.  Osseous metastatic disease involving the medial aspect of the imaged bilateral superior ribs, most conspicuously involving the posterior medial aspect of the left 2nd rib.  5.  Indeterminate approximately 1.3 cm nodule within the imaged right lung apex.  Above findings discussed with Dr. Preston Fleeting at 13 28.   Original Report Authenticated By: Tacey Ruiz, MD   Dg Chest Portable 1 View  09/23/2012   *RADIOLOGY REPORT*  Clinical Data: Weakness.  History of hypertension.  PORTABLE CHEST - 1 VIEW  Comparison: 09/16/2006  Findings: Artifact overlies the  chest.  The heart is enlarged. There is venous hypertension.  There may be early interstitial edema.  There is volume loss in the lower lobes, left more than right.  Basilar pneumonia is not excluded.  There appear to be old healed rib fractures.  There has been previous vertebral augmentation in the lower thoracic region. Sclerotic foci are seen within the skeleton consistent with progression of blastic metastatic disease (prostate cancer).  IMPRESSION: Possible fluid overload/early interstitial edema.  Density in the lower lobes left worse than right that could be atelectasis or pneumonia.  Sclerotic foci evident in the skeleton progressive since the previous studies consistent with blastic metastases related to prostate cancer.   Original Report Authenticated By: Paulina Fusi, M.D.    Scheduled Meds: . antiseptic oral rinse  15 mL Mouth Rinse BID  . bicalutamide  50 mg Oral Daily  . calcitonin  100 Units Subcutaneous Daily  . docusate sodium  100 mg Oral BID  . feeding supplement  1 Container Oral TID BM  . folic acid  1 mg Oral Daily  . heparin  5,000 Units Subcutaneous Q8H  . pamidronate  60 mg Intravenous Once  . pantoprazole  40 mg Oral Q1200  . polyethylene glycol  34 g Oral BID  . senna  1 tablet Oral BID  . sodium chloride  3 mL Intravenous Q12H  .  thiamine  100 mg Oral Daily   Continuous Infusions: . sodium chloride 100 mL/hr at 09/24/12 0544    Principal Problem:   Hypercalcemia Active Problems:   Hypertrophic obstructive cardiomyopathy   Deep vein thrombosis (DVT)   Chronic venous hypertension due to DVT with ulcer and inflammation   Prostate cancer   Hypertension   Hyperlipidemia   Spine metastasis   Unspecified constipation   Anemia   Acute renal failure (ARF)   CKD stage G3b/A1, GFR 30 - 44 and albumin creatinine ratio <30 mg/g   Malnutrition of moderate degree   Closed C1 fracture   Hyponatremia        Diona Peregoy  [facility] Pager [phone]. If  7PM-7AM, please contact night-coverage at [email or web address], password [email or web address] 09/24/2012, 7:38 AM  LOS: 1 day

## 2012-09-24 NOTE — Progress Notes (Signed)
PT had a calcium of 14.9. RN paged Md on call and is awaiting further orders.

## 2012-09-24 NOTE — Evaluation (Signed)
Physical Therapy Evaluation Patient Details Name: Roy Blackwell MRN: [id number] DOB: 1928/08/09 Today's Date: 09/24/2012 Time: 6578-4696 PT Time Calculation (min): 13 min  PT Assessment / Plan / Recommendation Clinical Impression    Pt admitted with C-1 fx and metastatic prostate CA. Pt currently with functional limitations due to the deficits listed below (PT Problem List). Pt will benefit from skilled PT to increase their independence and safety with mobility to allow discharge to SNF unless family able to provide necessary care at home.      PT Assessment  Patient needs continued PT services    Follow Up Recommendations  SNF    Does the patient have the potential to tolerate intense rehabilitation      Barriers to Discharge        Equipment Recommendations  Other (comment) (TBA)    Recommendations for Other Services     Frequency Min 3X/week    Precautions / Restrictions Precautions Precautions: Fall Required Braces or Orthoses: Cervical Brace Cervical Brace: Hard collar;Other (comment) (at all times)   Pertinent Vitals/Pain No signs of pain.      Mobility  Bed Mobility Bed Mobility: Rolling Right;Rolling Left;Supine to Sit;Sitting - Scoot to [place] of Bed;Sit to Supine Rolling Right: 2: Max assist Rolling Left: 2: Max assist Supine to Sit: 2: Max assist Sitting - Scoot to [place] of Bed: 2: Max assist Sit to Supine: 2: Max assist Details for Bed Mobility Assistance: Assist for all mobility and verbal/tactile cues for technique. Transfers Details for Transfer Assistance: Attempted to stand but pt not making any effort to stand due to cognitive deficits.    Exercises     PT Diagnosis: Difficulty walking;Generalized weakness;Altered mental status  PT Problem List: Decreased strength;Decreased activity tolerance;Decreased balance;Decreased mobility;Decreased cognition;Decreased knowledge of use of DME;Decreased safety awareness;Decreased knowledge of precautions PT  Treatment Interventions: DME instruction;Gait training;Functional mobility training;Therapeutic activities;Balance training;Therapeutic exercise;Cognitive remediation;Patient/family education   PT Goals Acute Rehab PT Goals PT Goal Formulation: Patient unable to participate in goal setting Time For Goal Achievement: 10/01/12 Potential to Achieve Goals: Fair Pt will go Supine/Side to Sit: with min assist PT Goal: Supine/Side to Sit - Progress: Goal set today Pt will go Sit to Supine/Side: with min assist PT Goal: Sit to Supine/Side - Progress: Goal set today Pt will go Sit to Stand: with mod assist PT Goal: Sit to Stand - Progress: Goal set today Pt will go Stand to Sit: with mod assist PT Goal: Stand to Sit - Progress: Goal set today Pt will Transfer Bed to Chair/Chair to Bed: with mod assist PT Transfer Goal: Bed to Chair/Chair to Bed - Progress: Goal set today  Visit Information  Last PT Received On: 09/24/12 Assistance Needed: +2    Subjective Data  Subjective: "Thank you," pt stated when I was leaving room Patient Stated Goal: Pt unable to state.   Prior Functioning  Home Living Additional Comments: Pt unable to answer questions concerning prior status. Prior Function Comments: Pt unable to answer.    Cognition  Cognition Arousal/Alertness: Awake/alert Behavior During Therapy: Restless Overall Cognitive Status: No family/caregiver present to determine baseline cognitive functioning Area of Impairment: Orientation;Attention;Following commands;Problem solving Orientation Level: Disoriented to;Place;Time;Situation Current Attention Level: Focused Following Commands: Follows one step commands inconsistently Problem Solving: Slow processing;Decreased initiation;Difficulty sequencing;Requires verbal cues;Requires tactile cues    Extremity/Trunk Assessment Right Lower Extremity Assessment RLE ROM/Strength/Tone: Unable to fully assess;Due to impaired cognition Left Lower  Extremity Assessment LLE ROM/Strength/Tone: Unable to fully assess;Due to impaired cognition  Balance Balance Balance Assessed: Yes Static Sitting Balance Static Sitting - Balance Support: Bilateral upper extremity supported Static Sitting - Level of Assistance: 4: Min assist  End of Session PT - End of Session Equipment Utilized During Treatment: Gait belt;Cervical collar Activity Tolerance: Patient limited by fatigue Patient left: in bed;with call bell/phone within reach;with nursing in room (sitter present) Nurse Communication: Mobility status  GP     [name] 09/24/2012, 10:58 AM  Skip Mayer PT [phone]

## 2012-09-24 NOTE — Progress Notes (Signed)
Pt draining red blood fromfoley. On call NP notified, no new orders given.

## 2012-09-24 NOTE — Progress Notes (Signed)
INITIAL NUTRITION ASSESSMENT  DOCUMENTATION CODES Per approved criteria  -Severe malnutrition in the context of chronic illness   INTERVENTION: 1. Continue Ensure TID per previous orders 2. Down grade diet to Puree (Dysphagia 1) 3. If pt continues with problems swallowing meals, may benefit from SLP evaluation  NUTRITION DIAGNOSIS: Inadequate oral intake related to increased needs and decreased swallowing ability as evidenced by weight loss.   Goal: PO intake to meet>/=90% estimated nutrition needs with good tolerance  Monitor:  PO intake, diet tolerance, weight trends, labs   Reason for Assessment: Malnutrition Screening Tool  79 y.o. male  Admitting Dx: Hypercalcemia  ASSESSMENT: Pt with metastatic prostate cancer with met to the bones, abdomen, and pelvis. Was brought to ED with increased weakness and fall. In ED found with stable C1 fracture, no plans for surgery, in hard cervical collar.   Pt with sitter in room. States he is eating well. Sitter states pt is not swallowing solids at all, holding food in mouth. Does tolerate liquids fine.  Pt with 20 lb weight loss in 3 months per weight hx, 13% body weight, severe weight loss. Meal completion was recorded at 5% this morning. Family reported decreased intake for several weeks PTA per notes.   Pt meets criteria for severe malnutrition in the context of chronic illness 2/2 weight loss of 13% body weight in 3 months and meeting <75% estimated needs for >/=1 month.   Height: Ht Readings from Last 1 Encounters:  09/24/12 5\' 6"  (1.676 m)    Weight: Wt Readings from Last 1 Encounters:  09/24/12 125 lb 14.1 oz (57.1 kg)    Ideal Body Weight: 142 lbs   % Ideal Body Weight: 88%  Wt Readings from Last 10 Encounters:  09/24/12 125 lb 14.1 oz (57.1 kg)  09/11/12 121 lb 6.4 oz (55.067 kg)  08/30/12 125 lb 8 oz (56.926 kg)  08/23/12 128 lb 9.6 oz (58.333 kg)  08/09/12 130 lb (58.968 kg)  08/01/12 135 lb (61.236 kg)   07/26/12 137 lb (62.143 kg)  07/11/12 140 lb (63.504 kg)  06/20/12 141 lb (63.957 kg)  06/06/12 145 lb (65.772 kg)    Usual Body Weight: 145 lbs   % Usual Body Weight: 86%  BMI:  Body mass index is 20.33 kg/(m^2). WNL   Estimated Nutritional Needs: Kcal: 1800-2000 Protein: 75-85 gm  Fluid: 1.8-2 L   Skin: Stage II pressure ulcer on ankle  Diet Order: General  EDUCATION NEEDS: -No education needs identified at this time   Intake/Output Summary (Last 24 hours) at 09/24/12 1136 Last data filed at 09/24/12 0900  Gross per 24 hour  Intake    468 ml  Output      0 ml  Net    468 ml    Last BM: PTA   Labs:   Recent Labs Lab 09/23/12 1024 09/24/12 0355  NA 125* 129*  K 5.1 4.5  CL 91* 95*  CO2 20 21  BUN 57* 55*  CREATININE 2.09* 1.93*  CALCIUM >15.0* 14.9*  GLUCOSE 93 80    CBG (last 3)  No results found for this basename: GLUCAP,  in the last 72 hours  Scheduled Meds: . antiseptic oral rinse  15 mL Mouth Rinse BID  . bicalutamide  50 mg Oral Daily  . calcitonin  100 Units Subcutaneous Daily  . docusate sodium  100 mg Oral BID  . feeding supplement  1 Container Oral TID BM  . heparin  5,000 Units Subcutaneous Q8H  .  pamidronate  60 mg Intravenous Once  . pantoprazole (PROTONIX) IV  40 mg Intravenous Q24H  . polyethylene glycol  34 g Oral BID  . senna  1 tablet Oral BID  . sodium chloride  3 mL Intravenous Q12H    Continuous Infusions: . sodium chloride 100 mL/hr at 09/24/12 0544    Past Medical History  Diagnosis Date  . Hypertrophic cardiomyopathy   . Prostate cancer 08/27/2011  . Hypertension 08/27/2011  . Pulmonary nodule 08/27/2011    2008  . Hyperlipidemia 08/27/2011  . Inguinal lymphadenopathy 08/31/2011    Bulky bilat 2009 CT  . Warfarin anticoagulation 08/31/2011  . Hydronephrosis 09/01/2011  . CKD (chronic kidney disease) 09/01/2011  . Abdominal lymphadenopathy 09/01/2011  . Venous insufficiency     His diagnosis this is a note on Roy Blackwell he was diagnosed as having chronic thrombophlebitis when he was just in back in the 50s and has seen numerous surgeons Dr. Nedra Hai Dr. 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 Dr. Genelle Bal he continued his Coumadin management but he has no history of atri    History reviewed. No pertinent past surgical history.  Clarene Duke RD, LDN Pager [phone] After Hours pager [phone]

## 2012-09-24 NOTE — Progress Notes (Signed)
Pt confused, oriented x1. Tried to pull collar off - pulled his chin out of chinrest. Collar reapplied w/ MD at bedside. Alerted charge nurse that Recruitment consultant is needed asap.

## 2012-09-24 NOTE — Progress Notes (Signed)
Post void residual was 517 ml. Foley ordered.

## 2012-09-25 ENCOUNTER — Encounter (INDEPENDENT_AMBULATORY_CARE_PROVIDER_SITE_OTHER): Payer: Medicare Other

## 2012-09-25 ENCOUNTER — Other Ambulatory Visit: Payer: Self-pay

## 2012-09-25 LAB — CBC
Hemoglobin: 8.4 g/dL — ABNORMAL LOW (ref 13.0–17.0)
MCHC: 34.4 g/dL (ref 30.0–36.0)
Platelets: 246 10*3/uL (ref 150–400)
RBC: 2.88 MIL/uL — ABNORMAL LOW (ref 4.22–5.81)

## 2012-09-25 LAB — GLUCOSE, CAPILLARY: Glucose-Capillary: 112 mg/dL — ABNORMAL HIGH (ref 70–99)

## 2012-09-25 LAB — BASIC METABOLIC PANEL
BUN: 44 mg/dL — ABNORMAL HIGH (ref 6–23)
GFR calc Af Amer: 54 mL/min — ABNORMAL LOW (ref >90)
GFR calc non Af Amer: 47 mL/min — ABNORMAL LOW (ref >90)
Potassium: 4 mEq/L (ref 3.5–5.1)
Sodium: 136 mEq/L (ref 135–145)

## 2012-09-25 MED ORDER — SODIUM CHLORIDE 0.9 % IV SOLN
INTRAVENOUS | Status: DC
  Administered 2012-09-25 – 2012-09-29 (×7): via INTRAVENOUS
  Administered 2012-09-29: 1000 mL via INTRAVENOUS
  Administered 2012-09-30: 05:00:00 via INTRAVENOUS

## 2012-09-25 MED ORDER — MAGNESIUM HYDROXIDE 400 MG/5ML PO SUSP: 30.0000 mL | Freq: Once | ORAL | Status: DC

## 2012-09-25 NOTE — Progress Notes (Signed)
Pt's heart rate between 120-140s. Pt denies any chest pain. On call NP notified. EKG obtained. No new orders given. Will continue to monitor.

## 2012-09-25 NOTE — Progress Notes (Signed)
[facility] PROGRESS NOTE  AFNAN EMBERTON ZOX:[id number] DOB: Dec 26, 1928 DOA: 09/23/2012 PCP: [name], MD  Assessment/Plan:  Hypercalcemia - secondary to stage IV prostate cancer  Corrected calcium level is 16.3 on 09/24/2012 On IV normal saline from the day of admission. He was started on subcutaneous calcitonin.  By may the 27th we felt that he was euvolemic and he was prescribed pamidronate 60 mg IV x1.  Continue IV fluids, calcium is improving.  Metastatic prostate cancer. PSA >1460 (08/31/2011) -> 623 (11/15/2011) -> 348 (03/18/2012) -> 587 (08/30/2012).  Followed by Dr. Mena Goes - On Firmagon/Casodex. On Xgeva for bone disease.  CT scan 08/31/2011 shows bulky intrathoracic and intraabdominal adenopathy.  He also has a pathologic fx of L4. He had a IR bone tumor ablation 10/17/2011 by Dr. Corliss Skains.  Saw Dr. Clelia Croft (08/30/2012) - for metastatic prostate ca. upon admission to [facility] May 27 a CT scan of the cervical spine indicates numerous blastic metastatic deposits.   Bilateral chronic venous stasis ulcers Medially above his ankles. L>R  He grew out Pseudomonas on cultures in May 2012 and has been on Septra since then from [facility].  For now we did stop antibiotics until clinical status stabilizes. Wound care consult in the hospital was obtained  History of recurrent DVTs.  The patient was On coumadin until his anemia has become too severe for treatment to be safe.  If no major bleeding is noted in hemoglobin remained stable we may resume Coumadin in the hospital.  Currently the patient is on heparin 5000 units 3 times a day for DVT prophylaxis.   Hypertrophic cardiomyopathy Monitor volume status closely  Constipation  Treatment with Senokot, MiraLAX, Colace , secondary to the hypercalcemia.  Severe protein caloric malnutrition  Weight down about 20 pounds over the last 3 months  He weighed 145 in February, 2014. Weight recorded on admission is 125 pounds Ensure 3  times a day prescribed on admission.   Anemia Hgb was 5.2 on  08/30/2012 and patient did received one unit of packed red blood cells in the [facility]. Hb was measured at 6 on the day of admission . The family denies noticing any active bleeding. Anemia is probably related to advanced prostate cancer infiltrating the bone marrow  Patient was transfused 2 units of packed red blood cells on May 26 with good response at the followup labs   C 1 cervical spine fracture post fall - Patient was placed in hard cervical collar on admission at the recommendation of Dr. Tressie Stalker for neurosurgery, on call on May 26. Consultation for radiation therapy to be considered if oncology feels worthwhile trying. On the morning of May 27 the patient was found with his cervical collar off after he took it off himself. Save considerable placed at the bedside so the patient cannot remove his cervical collar again.   Hyponatremia - ? Cause - combination of dehydration and Siadh. IV fluids with free water restriction. Continue to monitor  Acute on chronic renal failure-probably prerenal. Started IV fluids with mild improvement by May 27 . followup urine output and creatinine level.    Toxic metabolic encephalopathy from hypercalcemia - sitter at bedside  Code Status: Full code per family request Family Communication: Wife yesterday  Disposition Plan: Unclear   Consultants:  Oncology  Procedures:  CT cervical spine  Antibiotics:  HPI/Subjective: Confused, does not know why he is in the hospital, denies pain  Objective: Filed Vitals:   09/24/12 0544 09/24/12  1333 09/24/12 2124 09/25/12 0622  BP: 153/48 120/53 131/55 126/61  Pulse:  74 78 75  Temp:  98.4 F (36.9 C) 98.4 F (36.9 C) 98.7 F (37.1 C)  TempSrc:  Oral Oral Oral  Resp:  22 18 20   Height: 5\' 6"  (1.676 m)     Weight:      SpO2:  98% 93% 95%   Patient Vitals for the past 24 hrs:  BP Temp Temp src Pulse Resp SpO2   09/25/12 0622 126/61 mmHg 98.7 F (37.1 C) Oral 75 20 95 %  09/24/12 2124 131/55 mmHg 98.4 F (36.9 C) Oral 78 18 93 %  09/24/12 1333 120/53 mmHg 98.4 F (36.9 C) Oral 74 22 98 %     Intake/Output Summary (Last 24 hours) at 09/25/12 1036 Last data filed at 09/25/12 0853  Gross per 24 hour  Intake 1418.33 ml  Output   1600 ml  Net -181.67 ml   Filed Weights   09/23/12 1506 09/24/12 0217 09/24/12 0518  Weight: 56.7 kg (125 lb) 57.9 kg (127 lb 10.3 oz) 57.1 kg (125 lb 14.1 oz)    Exam:   General:  Alert, not oriented x3  Cardiovascular: Regular rate and rhythm without murmurs rubs or gallops  Respiratory: Clear to auscultation bilaterally  Abdomen: Excavated, nontender  Musculoskeletal: Tenderness over the spine  Neurological exam-cranial nerves 2-12 intact, strength 4/5 on the left side, strength 5 out of 5 on the right side   Data Reviewed: Basic Metabolic Panel:  Recent Labs Lab 09/23/12 1024 09/24/12 0355 09/25/12 0505  NA 125* 129* 136  K 5.1 4.5 4.0  CL 91* 95* 104  CO2 20 21 20   GLUCOSE 93 80 111*  BUN 57* 55* 44*  CREATININE 2.09* 1.93* 1.37*  CALCIUM >15.0* 14.9* 13.6*   Liver Function Tests:  Recent Labs Lab 09/23/12 1024 09/24/12 0355  AST 22 27  ALT 6 7  ALKPHOS 127* 137*  BILITOT 0.2* 0.3  PROT 6.1 6.1  ALBUMIN 2.2* 2.3*   No results found for this basename: LIPASE, AMYLASE,  in the last 168 hours No results found for this basename: AMMONIA,  in the last 168 hours CBC:  Recent Labs Lab 09/23/12 1024 09/23/12 2325 09/24/12 0355 09/25/12 0505  WBC 12.4*  --  13.3* 13.4*  NEUTROABS 7.7  --   --   --   HGB 6.0* 9.2* 9.1* 8.4*  HCT 18.2* 26.7* 26.6* 24.4*  MCV 93.3  --  85.3 84.7  PLT 321  --  310 246   Cardiac Enzymes:  Recent Labs Lab 09/23/12 1024  TROPONINI <0.30   BNP (last 3 results) No results found for this basename: PROBNP,  in the last 8760 hours CBG: No results found for this basename: GLUCAP,  in the last  168 hours  Recent Results (from the past 240 hour(s))  URINE CULTURE     Status: None   Collection Time    09/23/12 11:10 AM      Result Value Range Status   Specimen Description URINE, RANDOM   Final   Special Requests NONE   Final   Culture  Setup Time 09/23/2012 20:28   Final   Colony Count NO GROWTH   Final   Culture NO GROWTH   Final   Report Status 09/24/2012 FINAL   Final     Studies: Ct Head Wo Contrast  09/23/2012   *RADIOLOGY REPORT*  Clinical Data: Several falls of the last several weeks with confusion,  history of prostate cancer  CT HEAD WITHOUT CONTRAST CT CERVICAL SPINE WITHOUT CONTRAST  Technique:  Multidetector CT imaging of the head and cervical spine was performed following the standard protocol without intravenous contrast.  Multiplanar CT image reconstructions of the cervical spine were also generated.  Comparison:  Head CT - 09/16/2006; chest CT - 09/16/2006  CT HEAD  Findings:  The examination is degraded secondary to patient motion artifact necessitating additional images.  In particular, there is suboptimal evaluation of the base of the skull.  Grossly unchanged findings of mild diffuse atrophy with stable diffuse sulcal prominence.  Grossly unchanged mild centralized volume loss with corresponding ex vacuo dilatation of the ventricular system.  Scattered periventricular hypodensities compatible with microvascular ischemic disease.  Old lacunar infarcts within the bilateral basal ganglia.  Given the degradation of the examination and extensive background parenchymal abnormalities, there is no CT evidence of acute large territory infarct.  No definite intraparenchymal or extra-axial mass or hemorrhage.  Unchanged size configuration of the ventricles and basilar cisterns.  No midline shift.  Minimal mucosal thickening within the left sphenoid sinus.  The remaining paranasal sinuses and mastoid air cells are normally aerated.  Regional soft tissues are normal.  No definite  displaced calvarial fracture.  IMPRESSION: 1.  Degraded examination without definite acute intracranial process.  If clinical concern persists, further evaluation with repeat head CT when the patient better able to tolerate the examination is recommended. 2. Grossly stable findings of advanced atrophy and microvascular ischemic disease.  CT CERVICAL SPINE  Findings:  C1 to the superior endplate of T2 is imaged.  There is increased mottled sclerosis most conspicuous involving the T2 vertebral body with additional ill-defined areas of sclerosis within the left lateral aspect of the C7 and C9 T1 vertebral bodies, worrisome for osseous metastatic disease.  There is mottled sclerosis involving the posterior medial aspect of several of the imaged superior ribs, most conspicuously involving the posterior aspect of the left sided second rib.  There is a comminuted displaced likely pathologic fracture involving the right lateral aspect of the arch of C1 with associated posterior lateral displacement of the right side of the arch of C1 and left lateral deviation of the left side of the arch of C1.  These findings are associated with cranial migration of the dens through the foramen magnum.  There is an approximate 0.8 x 0.6 cm displaced osseous fragment seen adjacent to the base of the dens within the spinal canal (image 14, series four).  There is an age indeterminate avulsion fracture involving the spinous process of the T1 vertebral body.  Calcifications within the bilateral carotid bulbs.  Limited visualization of the lung eighty suggests a right apical approximately 1.3 x 0.9 cm nodule (image 53, series five).  IMPRESSION: 1.  Findings worrisome for extensive osseous metastatic disease to the cervical spine, most conspicuously involving the T2 vertebral body. 2.   Comminuted, displaced, likely pathologic fracture involving primarily the right lateral ring of C1 with associated displacement and cranial migration of the  dens through the foramen magnum. There is an approximately 0.8 cm displaced osseous fragment adjacent to the base of the dens within the spinal canal (image 14, series four).  3.  Age indeterminate avulsion fracture involving the spinous process of the T1 vertebral body.  4.  Osseous metastatic disease involving the medial aspect of the imaged bilateral superior ribs, most conspicuously involving the posterior medial aspect of the left 2nd rib.  5.  Indeterminate approximately  1.3 cm nodule within the imaged right lung apex.  Above findings discussed with Dr. Preston Fleeting at 13 28.   Original Report Authenticated By: Tacey Ruiz, MD   Ct Cervical Spine Wo Contrast  09/23/2012   *RADIOLOGY REPORT*  Clinical Data: Several falls of the last several weeks with confusion, history of prostate cancer  CT HEAD WITHOUT CONTRAST CT CERVICAL SPINE WITHOUT CONTRAST  Technique:  Multidetector CT imaging of the head and cervical spine was performed following the standard protocol without intravenous contrast.  Multiplanar CT image reconstructions of the cervical spine were also generated.  Comparison:  Head CT - 09/16/2006; chest CT - 09/16/2006  CT HEAD  Findings:  The examination is degraded secondary to patient motion artifact necessitating additional images.  In particular, there is suboptimal evaluation of the base of the skull.  Grossly unchanged findings of mild diffuse atrophy with stable diffuse sulcal prominence.  Grossly unchanged mild centralized volume loss with corresponding ex vacuo dilatation of the ventricular system.  Scattered periventricular hypodensities compatible with microvascular ischemic disease.  Old lacunar infarcts within the bilateral basal ganglia.  Given the degradation of the examination and extensive background parenchymal abnormalities, there is no CT evidence of acute large territory infarct.  No definite intraparenchymal or extra-axial mass or hemorrhage.  Unchanged size configuration of the  ventricles and basilar cisterns.  No midline shift.  Minimal mucosal thickening within the left sphenoid sinus.  The remaining paranasal sinuses and mastoid air cells are normally aerated.  Regional soft tissues are normal.  No definite displaced calvarial fracture.  IMPRESSION: 1.  Degraded examination without definite acute intracranial process.  If clinical concern persists, further evaluation with repeat head CT when the patient better able to tolerate the examination is recommended. 2. Grossly stable findings of advanced atrophy and microvascular ischemic disease.  CT CERVICAL SPINE  Findings:  C1 to the superior endplate of T2 is imaged.  There is increased mottled sclerosis most conspicuous involving the T2 vertebral body with additional ill-defined areas of sclerosis within the left lateral aspect of the C7 and C9 T1 vertebral bodies, worrisome for osseous metastatic disease.  There is mottled sclerosis involving the posterior medial aspect of several of the imaged superior ribs, most conspicuously involving the posterior aspect of the left sided second rib.  There is a comminuted displaced likely pathologic fracture involving the right lateral aspect of the arch of C1 with associated posterior lateral displacement of the right side of the arch of C1 and left lateral deviation of the left side of the arch of C1.  These findings are associated with cranial migration of the dens through the foramen magnum.  There is an approximate 0.8 x 0.6 cm displaced osseous fragment seen adjacent to the base of the dens within the spinal canal (image 14, series four).  There is an age indeterminate avulsion fracture involving the spinous process of the T1 vertebral body.  Calcifications within the bilateral carotid bulbs.  Limited visualization of the lung eighty suggests a right apical approximately 1.3 x 0.9 cm nodule (image 53, series five).  IMPRESSION: 1.  Findings worrisome for extensive osseous metastatic disease to  the cervical spine, most conspicuously involving the T2 vertebral body. 2.   Comminuted, displaced, likely pathologic fracture involving primarily the right lateral ring of C1 with associated displacement and cranial migration of the dens through the foramen magnum. There is an approximately 0.8 cm displaced osseous fragment adjacent to the base of the dens within the spinal  canal (image 14, series four).  3.  Age indeterminate avulsion fracture involving the spinous process of the T1 vertebral body.  4.  Osseous metastatic disease involving the medial aspect of the imaged bilateral superior ribs, most conspicuously involving the posterior medial aspect of the left 2nd rib.  5.  Indeterminate approximately 1.3 cm nodule within the imaged right lung apex.  Above findings discussed with Dr. Preston Fleeting at 13 28.   Original Report Authenticated By: Tacey Ruiz, MD    Scheduled Meds: . antiseptic oral rinse  15 mL Mouth Rinse BID  . bicalutamide  50 mg Oral Daily  . calcitonin  100 Units Subcutaneous Daily  . docusate sodium  100 mg Oral BID  . feeding supplement  1 Container Oral TID BM  . heparin  5,000 Units Subcutaneous Q8H  . pantoprazole (PROTONIX) IV  40 mg Intravenous Q24H  . polyethylene glycol  34 g Oral BID  . senna  1 tablet Oral BID  . sodium chloride  3 mL Intravenous Q12H   Continuous Infusions: . sodium chloride 100 mL/hr at 09/24/12 0544    Principal Problem:   Hypercalcemia Active Problems:   Hypertrophic obstructive cardiomyopathy(425.11)   Deep vein thrombosis (DVT)   Chronic venous hypertension due to DVT with ulcer and inflammation   Prostate cancer   Hypertension   Hyperlipidemia   Spine metastasis   Unspecified constipation   Anemia   Acute renal failure (ARF)   CKD stage G3b/A1, GFR 30 - 44 and albumin creatinine ratio <30 mg/g   Malnutrition of moderate degree   Closed C1 fracture   Hyponatremia        Silvestre Mines A  [facility] Pager [phone]. If  7PM-7AM, please contact night-coverage at [email or web address], password [facility] 09/25/2012, 10:36 AM  LOS: 2 days

## 2012-09-26 DIAGNOSIS — E440 Moderate protein-calorie malnutrition: Secondary | ICD-10-CM

## 2012-09-26 LAB — RENAL FUNCTION PANEL
CO2: 15 mEq/L — ABNORMAL LOW (ref 19–32)
Chloride: 110 mEq/L (ref 96–112)
Creatinine, Ser: 1.58 mg/dL — ABNORMAL HIGH (ref 0.50–1.35)
GFR calc Af Amer: 46 mL/min — ABNORMAL LOW (ref >90)
GFR calc non Af Amer: 39 mL/min — ABNORMAL LOW (ref >90)
Potassium: 3 mEq/L — ABNORMAL LOW (ref 3.5–5.1)
Sodium: 141 mEq/L (ref 135–145)

## 2012-09-26 LAB — CBC
MCV: 86.8 fL (ref 78.0–100.0)
Platelets: 225 10*3/uL (ref 150–400)
RBC: 2.65 MIL/uL — ABNORMAL LOW (ref 4.22–5.81)
WBC: 12.9 10*3/uL — ABNORMAL HIGH (ref 4.0–10.5)

## 2012-09-26 MED ORDER — POTASSIUM CHLORIDE CRYS ER 20 MEQ PO TBCR
40.0000 meq | EXTENDED_RELEASE_TABLET | Freq: Four times a day (QID) | ORAL | Status: AC
  Administered 2012-09-26 (×2): 40 meq via ORAL
  Filled 2012-09-26: qty 2

## 2012-09-26 MED ORDER — PANTOPRAZOLE SODIUM 40 MG PO TBEC
40.0000 mg | DELAYED_RELEASE_TABLET | Freq: Every day | ORAL | Status: DC
  Administered 2012-09-26 – 2012-10-01 (×6): 40 mg via ORAL
  Filled 2012-09-26 (×6): qty 1

## 2012-09-26 MED ORDER — ACETAMINOPHEN 650 MG RE SUPP: 650.0000 mg | RECTAL | Status: DC | PRN

## 2012-09-26 NOTE — Progress Notes (Signed)
Physical Therapy Treatment Patient Details Name: Roy Blackwell MRN: [id number] DOB: 1928/11/19 Today's Date: 09/26/2012 Time: 4098-1191 PT Time Calculation (min): 24 min  PT Assessment / Plan / Recommendation Comments on Treatment Session  Pt needed extra time and frequent stimulation to stay awake.  He went along with AAROM to LE and RUE, but did not follow commands.  Pt was unable to tolerate sitting  on EOB today.  Cervical collar maintained    Follow Up Recommendations  SNF     Does the patient have the potential to tolerate intense rehabilitation     Barriers to Discharge        Equipment Recommendations  Other (comment) (TBA)    Recommendations for Other Services    Frequency Min 3X/week   Plan Discharge plan remains appropriate;Frequency remains appropriate    Precautions / Restrictions     Pertinent Vitals/Pain Pt with occasional expressions of pain with bed mobility    Mobility  Bed Mobility Bed Mobility: Rolling Right;Rolling Left;Supine to Sit;Sitting - Scoot to [place] of Bed;Sit to Supine Rolling Right: 2: Max assist;1: +2 Total assist Rolling Right: Patient Percentage: 10% Rolling Left: 2: Max assist;1: +2 Total assist Rolling Left: Patient Percentage: 10% Details for Bed Mobility Assistance: Assist for all mobility and verbal/tactile cues for technique. Pt was unable to get to sitting on edge of bed today Ambulation/Gait Ambulation/Gait Assistance: Not tested (comment)    Exercises General Exercises - Upper Extremity Shoulder Flexion: PROM;Right;5 reps (left arm is in elbow extension to protect IV) Elbow Flexion: PROM;Right;5 reps Elbow Extension: PROM;Right Wrist Flexion: PROM;Right;5 reps Wrist Extension: PROM;Right;5 reps General Exercises - Lower Extremity Ankle Circles/Pumps: AAROM;Both;5 reps;Supine Hip ABduction/ADduction: AAROM;Both;10 reps;Supine Hip Flexion/Marching: AAROM;Both;10 reps;Supine   PT Diagnosis:    PT Problem List:   PT Treatment  Interventions:     PT Goals Acute Rehab PT Goals PT Goal Formulation: Patient unable to participate in goal setting Time For Goal Achievement: 10/01/12 Potential to Achieve Goals: Fair Pt will go Supine/Side to Sit: with min assist PT Goal: Supine/Side to Sit - Progress: Not progressing Pt will go Sit to Supine/Side: with min assist PT Goal: Sit to Supine/Side - Progress: Not progressing Pt will go Sit to Stand: with mod assist Pt will go Stand to Sit: with mod assist Pt will Transfer Bed to Chair/Chair to Bed: with mod assist  Visit Information  Last PT Received On: 09/26/12    Subjective Data  Subjective: Pt fell asleep frequently throughout treatment, but would awaken easily Patient Stated Goal: Pt unable to state.   Cognition  Cognition Arousal/Alertness: Lethargic Overall Cognitive Status: No family/caregiver present to determine baseline cognitive functioning Area of Impairment: Orientation;Attention;Following commands;Problem solving Orientation Level: Disoriented to;Place;Time;Situation Current Attention Level: Focused Following Commands: Follows one step commands inconsistently Problem Solving: Slow processing;Decreased initiation;Difficulty sequencing;Requires verbal cues;Requires tactile cues General Comments: pt did not follow commands, but did go along with gentle manual guidance to extend legs    Balance  Balance Balance Assessed: No  End of Session PT - End of Session Equipment Utilized During Treatment: Cervical collar Activity Tolerance: Patient limited by fatigue Patient left: in bed;with call bell/phone within reach;with nursing in room (sitter present) Nurse Communication: Mobility status   GP    Bayard Hugger. [place], [place] [phone] 09/26/2012, 9:28 AM

## 2012-09-26 NOTE — Progress Notes (Signed)
[facility] PROGRESS NOTE  Roy Blackwell YQM:[id number] DOB: 09-20-1928 DOA: 09/23/2012 PCP: [name], MD  HPI/Subjective: Confused, appears comfortable. I think even if he improved during this hospital stay he'll be appropriate for hospice. Dr. Clelia Croft please advise  Assessment/Plan:  Hypercalcemia - secondary to stage IV prostate cancer  Corrected calcium level is 16.3 on 09/24/2012 On IV normal saline from the day of admission. He was started on subcutaneous calcitonin.  By may the 27th we felt that he was euvolemic and he was prescribed pamidronate 60 mg IV x1.  Continue IV fluids, calcium is improving.  Metastatic prostate cancer. PSA >1460 (08/31/2011) -> 623 (11/15/2011) -> 348 (03/18/2012) -> 587 (08/30/2012).  Followed by Dr. Mena Goes - On Firmagon/Casodex. On Xgeva for bone disease.  CT scan 08/31/2011 shows bulky intrathoracic and intraabdominal adenopathy.  He also has a pathologic fx of L4. He had a IR bone tumor ablation 10/17/2011 by Dr. Corliss Skains.  Saw Dr. Clelia Croft (08/30/2012) - for metastatic prostate ca. upon admission to [facility] May 27 a CT scan of the cervical spine indicates numerous blastic metastatic deposits.   Bilateral chronic venous stasis ulcers Medially above his ankles. L>R  He grew out Pseudomonas on cultures in May 2012 and has been on Septra since then from [facility].  For now we did stop antibiotics until clinical status stabilizes. Wound care consult in the hospital was obtained  History of recurrent DVTs.  The patient was On coumadin until his anemia has become too severe for treatment to be safe.  If no major bleeding is noted in hemoglobin remained stable we may resume Coumadin in the hospital.  Currently the patient is on heparin 5000 units 3 times a day for DVT prophylaxis.   Hypertrophic cardiomyopathy Monitor volume status closely  Constipation  Treatment with Senokot, MiraLAX, Colace , secondary to the hypercalcemia.  Severe protein  caloric malnutrition  Weight down about 20 pounds over the last 3 months  He weighed 145 in February, 2014. Weight recorded on admission is 125 pounds Ensure 3 times a day prescribed on admission.   Anemia  Hgb was 5.2 on  08/30/2012 and patient did received one unit of packed red blood cells in the [facility].  Patient was transfused 2 units of packed RBCs on 09/23/2012. No evidence of obvious bleeding, hemoglobin dropped again to 7.7. I will hold on the transfusion  C 1 cervical spine fracture post fall - Patient was placed in hard cervical collar on admission at the recommendation of Dr. Tressie Stalker for neurosurgery, on call on May 26. Consultation for radiation therapy to be considered if oncology feels worthwhile trying. On the morning of May 27 the patient was found with his cervical collar off after he took it off himself. Save considerable placed at the bedside so the patient cannot remove his cervical collar again.   Hyponatremia - ? Cause - combination of dehydration and Siadh. IV fluids with free water restriction. Continue to monitor  Acute on chronic renal failure-probably prerenal. Started IV fluids with mild improvement by May 27 . followup urine output and creatinine level.    Toxic metabolic encephalopathy from hypercalcemia - sitter at bedside  Code Status: Full code per family request Family Communication: Wife yesterday  Disposition Plan: Unclear   Consultants:  Oncology  Procedures:  CT cervical spine  Antibiotics:    Objective: Filed Vitals:   09/25/12 2140 09/26/12 0419 09/26/12 0533 09/26/12 0625  BP: 134/55   107/53  Pulse: 87   80  Temp: 100.5 F (38.1 C) 101.9 F (38.8 C) 100.9 F (38.3 C) 100.2 F (37.9 C)  TempSrc: Oral Axillary Axillary Axillary  Resp: 20   18  Height:      Weight:      SpO2: 93%   94%   Patient Vitals for the past 24 hrs:  BP Temp Temp src Pulse Resp SpO2  09/26/12 0625 107/53 mmHg 100.2 F (37.9  C) Axillary 80 18 94 %  09/26/12 0533 - 100.9 F (38.3 C) Axillary - - -  09/26/12 0419 - 101.9 F (38.8 C) Axillary - - -  09/25/12 2140 134/55 mmHg 100.5 F (38.1 C) Oral 87 20 93 %  09/25/12 1653 132/56 mmHg 98.9 F (37.2 C) Oral 77 19 94 %     Intake/Output Summary (Last 24 hours) at 09/26/12 1229 Last data filed at 09/26/12 1610  Gross per 24 hour  Intake    500 ml  Output   1675 ml  Net  -1175 ml   Filed Weights   09/23/12 1506 09/24/12 0217 09/24/12 0518  Weight: 56.7 kg (125 lb) 57.9 kg (127 lb 10.3 oz) 57.1 kg (125 lb 14.1 oz)    Exam:   General:  Alert, not oriented x3  Cardiovascular: Regular rate and rhythm without murmurs rubs or gallops  Respiratory: Clear to auscultation bilaterally  Abdomen: Excavated, nontender  Musculoskeletal: Tenderness over the spine  Neurological exam-cranial nerves 2-12 intact, strength 4/5 on the left side, strength 5 out of 5 on the right side   Data Reviewed: Basic Metabolic Panel:  Recent Labs Lab 09/23/12 1024 09/24/12 0355 09/25/12 0505 09/26/12 0520  NA 125* 129* 136 141  K 5.1 4.5 4.0 3.0*  CL 91* 95* 104 110  CO2 20 21 20  15*  GLUCOSE 93 80 111* 125*  BUN 57* 55* 44* 44*  CREATININE 2.09* 1.93* 1.37* 1.58*  CALCIUM >15.0* 14.9* 13.6* 12.1*  PHOS  --   --   --  2.2*   Liver Function Tests:  Recent Labs Lab 09/23/12 1024 09/24/12 0355 09/26/12 0520  AST 22 27  --   ALT 6 7  --   ALKPHOS 127* 137*  --   BILITOT 0.2* 0.3  --   PROT 6.1 6.1  --   ALBUMIN 2.2* 2.3* 1.8*   No results found for this basename: LIPASE, AMYLASE,  in the last 168 hours No results found for this basename: AMMONIA,  in the last 168 hours CBC:  Recent Labs Lab 09/23/12 1024 09/23/12 2325 09/24/12 0355 09/25/12 0505 09/26/12 0520  WBC 12.4*  --  13.3* 13.4* 12.9*  NEUTROABS 7.7  --   --   --   --   HGB 6.0* 9.2* 9.1* 8.4* 7.7*  HCT 18.2* 26.7* 26.6* 24.4* 23.0*  MCV 93.3  --  85.3 84.7 86.8  PLT 321  --  310 246  225   Cardiac Enzymes:  Recent Labs Lab 09/23/12 1024  TROPONINI <0.30   BNP (last 3 results) No results found for this basename: PROBNP,  in the last 8760 hours CBG:  Recent Labs Lab 09/25/12 2136  GLUCAP 112*    Recent Results (from the past 240 hour(s))  URINE CULTURE     Status: None   Collection Time    09/23/12 11:10 AM      Result Value Range Status   Specimen Description URINE, RANDOM   Final   Special Requests NONE   Final  Culture  Setup Time 09/23/2012 20:28   Final   Colony Count NO GROWTH   Final   Culture NO GROWTH   Final   Report Status 09/24/2012 FINAL   Final     Studies: No results found.  Scheduled Meds: . antiseptic oral rinse  15 mL Mouth Rinse BID  . bicalutamide  50 mg Oral Daily  . calcitonin  100 Units Subcutaneous Daily  . feeding supplement  1 Container Oral TID BM  . heparin  5,000 Units Subcutaneous Q8H  . magnesium hydroxide  30 mL Oral Once  . pantoprazole  40 mg Oral Daily  . polyethylene glycol  34 g Oral BID  . sodium chloride  3 mL Intravenous Q12H   Continuous Infusions: . sodium chloride 100 mL/hr at 09/26/12 0320    Principal Problem:   Hypercalcemia Active Problems:   Hypertrophic obstructive cardiomyopathy(425.11)   Deep vein thrombosis (DVT)   Chronic venous hypertension due to DVT with ulcer and inflammation   Prostate cancer   Hypertension   Hyperlipidemia   Spine metastasis   Unspecified constipation   Anemia   Acute renal failure (ARF)   CKD stage G3b/A1, GFR 30 - 44 and albumin creatinine ratio <30 mg/g   Malnutrition of moderate degree   Closed C1 fracture   Hyponatremia        Yaminah Clayborn A  [facility] Pager [phone]. If 7PM-7AM, please contact night-coverage at [email or web address], password [facility] 09/26/2012, 12:29 PM  LOS: 3 days

## 2012-09-26 NOTE — Progress Notes (Signed)
Clinical Social Work Department CLINICAL SOCIAL WORK PLACEMENT NOTE 09/26/2012  Patient:  Roy Blackwell, Roy Blackwell  Account Number:  [id number] Admit date:  09/23/2012  Clinical Social Worker:  Unk Lightning, LCSW  Date/time:  09/26/2012 11:45 AM  Clinical Social Work is seeking post-discharge placement for this patient at the following level of care:   SKILLED NURSING   (*CSW will update this form in Epic as items are completed)     Patient/family provided with [facility] Department of Clinical Social Work's list of facilities offering this level of care within the geographic area requested by the patient (or if unable, by the patient's family).    Patient/family informed of their freedom to choose among providers that offer the needed level of care, that participate in Medicare, Medicaid or managed care program needed by the patient, have an available bed and are willing to accept the patient.    Patient/family informed of [facility]' ownership interest in [facility], as well as of the fact that they are under no obligation to receive care at this facility.  PASARR submitted to EDS on 09/26/2012 PASARR number received from EDS on 09/26/2012  FL2 transmitted to all facilities in geographic area requested by pt/family on   FL2 transmitted to all facilities within larger geographic area on   Patient informed that his/her managed care company has contracts with or will negotiate with  certain facilities, including the following:     Patient/family informed of bed offers received:   Patient chooses bed at  Physician recommends and patient chooses bed at    Patient to be transferred to  on   Patient to be transferred to facility by   The following physician request were entered in Epic:   Additional Comments:

## 2012-09-26 NOTE — Progress Notes (Signed)
NUTRITION FOLLOW UP/consult  Intervention:   1. Continue to offer nutrition supplements and meals 2. May benefit from enteral nutrition if aggressive treatment is desired  NUTRITION DIAGNOSIS:  Inadequate oral intake related to increased needs and decreased swallowing ability as evidenced by weight loss. Ongoing   Goal:  PO intake to meet>/=90% estimated nutrition needs with good tolerance. Unmet   Monitor:  PO intake, diet tolerance, weight trends, labs   Assessment:   RD consulted for poor po intake. Pt continues to be confused. RD downgraded diet on last assessment to Dysphagia 1 (puree).  Spoke with sitter, pt refused lunch and supplements.  Noted possible hospice/comfort approach. If continued aggressive treatment is desired, then pt would likely benefit from enteral nutrition, short term vs long term. If comfort approach is desired, recommend liberal diet and comfort feeding.    Height: Ht Readings from Last 1 Encounters:  09/24/12 5\' 6"  (1.676 m)    Weight Status:   Wt Readings from Last 1 Encounters:  09/24/12 125 lb 14.1 oz (57.1 kg)    Re-estimated needs:  Kcal: 1800-2000  Protein: 75-85 gm  Fluid: 1.8-2 L  Skin: stage II pressure ulcer on ankle   Diet Order: Dysphagia 1   Intake/Output Summary (Last 24 hours) at 09/26/12 1335 Last data filed at 09/26/12 0454  Gross per 24 hour  Intake    180 ml  Output   1675 ml  Net  -1495 ml    Last BM: 5/29   Labs:   Recent Labs Lab 09/24/12 0355 09/25/12 0505 09/26/12 0520  NA 129* 136 141  K 4.5 4.0 3.0*  CL 95* 104 110  CO2 21 20 15*  BUN 55* 44* 44*  CREATININE 1.93* 1.37* 1.58*  CALCIUM 14.9* 13.6* 12.1*  PHOS  --   --  2.2*  GLUCOSE 80 111* 125*    CBG (last 3)   Recent Labs  09/25/12 2136  GLUCAP 112*    Scheduled Meds: . antiseptic oral rinse  15 mL Mouth Rinse BID  . bicalutamide  50 mg Oral Daily  . calcitonin  100 Units Subcutaneous Daily  . feeding supplement  1 Container  Oral TID BM  . heparin  5,000 Units Subcutaneous Q8H  . magnesium hydroxide  30 mL Oral Once  . pantoprazole  40 mg Oral Daily  . polyethylene glycol  34 g Oral BID  . potassium chloride  40 mEq Oral Q6H  . sodium chloride  3 mL Intravenous Q12H    Continuous Infusions: . sodium chloride 100 mL/hr at 09/26/12 0320    Clarene Duke RD, LDN Pager [phone] After Hours pager [phone]

## 2012-09-26 NOTE — Progress Notes (Signed)
Clinical Social Work Department BRIEF PSYCHOSOCIAL ASSESSMENT 09/26/2012  Patient:  Roy Blackwell, Roy Blackwell     Account Number:  [id number]     Admit date:  09/23/2012  Clinical Social Worker:  Dennison Bulla  Date/Time:  09/26/2012 11:45 AM  Referred by:  Physician  Date Referred:  09/26/2012 Referred for  SNF Placement   Other Referral:   Interview type:  Patient Other interview type:    PSYCHOSOCIAL DATA Living Status:  FAMILY Admitted from facility:   Level of care:   Primary support name:  Verdell Primary support relationship to patient:  SPOUSE Degree of support available:   Strong    CURRENT CONCERNS Current Concerns  Post-Acute Placement   Other Concerns:    SOCIAL WORK ASSESSMENT / PLAN CSW received referral to assist with DC planning. CSW spoke with coworker who reports that CSW met with family yesterday to address DC plans and wife was not receptive to SNF placement. CSW agreed to follow up.    CSW met with patient, wife and son at bedside. Patient unable to participate in assessment. Patient and wife have been married for over 50 years. Patient was ambulatory and living at home with wife prior to admission. Wife reports that she wants patient to return home at DC. CSW explained CSW role of assisting with DC planning and SNF placement if needed. Wife is not ready to commit to SNF placement at this time. Per chart review, pending patient's progress, PMT might be consulted.    CSW has completed FL2 and placed on chart. CSW will continue to follow to discuss DC planning with family.   Assessment/plan status:  Psychosocial Support/Ongoing Assessment of Needs Other assessment/ plan:   Information/referral to community resources:   SNF information    PATIENT'S/FAMILY'S RESPONSE TO PLAN OF CARE: Patient unable to participate in assessment. Patient's wife involved with patient's care and not ready to discuss DC planning at this time.

## 2012-09-26 NOTE — Care Management Note (Signed)
    Page 1 of 2   10/01/2012     10:59:56 AM   CARE MANAGEMENT NOTE 10/01/2012  Patient:  Roy Blackwell, Roy Blackwell   Account Number:  [id number]  Date Initiated:  09/26/2012  Documentation initiated by:  Letha Cape  Subjective/Objective Assessment:   dx hypercalcemia, stage 5 cancer mets  admit- lives with spouse.     Action/Plan:   pt eval- rec snf  Palliative consult   Anticipated DC Date:  10/01/2012   Anticipated DC Plan:  SKILLED NURSING FACILITY  In-house referral  Clinical Social Worker      DC Planning Services  CM consult      Choice offered to / List presented to:             Status of service:  Completed, signed off Medicare Important Message given?   (If response is "NO", the following Medicare IM given date fields will be blank) Date Medicare IM given:   Date Additional Medicare IM given:    Discharge Disposition:  SKILLED NURSING FACILITY  Per UR Regulation:  Reviewed for med. necessity/level of care/duration of stay  If discussed at Long Length of Stay Meetings, dates discussed:    Comments:  10/01/12 10:52 Letha Cape RN ,BSN [phone] patient is for dc to SNF today, CSW following.  09/30/12 15:21 Eliasar Hlavaty RN, BSN [phone] patient had palliative meeting today, plan is for snf, CSW faxed out today, because wife did not want the CSW to fax out previously, awaiting bed offers.  Patient is for dc tomorrow.  09/27/12 12:21 Letha Cape RN, BSN [phone] palliative Consulted, to schedule meeting with family.  09/26/12 15:22 Letha Cape RN BSN [phone] patient lives with spouse, patient has stage 5 cancer mets, plan is for snf at dc, CSW following.

## 2012-09-27 LAB — BASIC METABOLIC PANEL
CO2: 18 mEq/L — ABNORMAL LOW (ref 19–32)
Calcium: 10.2 mg/dL (ref 8.4–10.5)
GFR calc non Af Amer: 62 mL/min — ABNORMAL LOW (ref >90)
Glucose, Bld: 87 mg/dL (ref 70–99)
Potassium: 3.5 mEq/L (ref 3.5–5.1)
Sodium: 145 mEq/L (ref 135–145)

## 2012-09-27 LAB — CBC
Hemoglobin: 6.8 g/dL — CL (ref 13.0–17.0)
MCH: 29.3 pg (ref 26.0–34.0)
MCV: 86.2 fL (ref 78.0–100.0)
Platelets: 199 10*3/uL (ref 150–400)
RBC: 2.32 MIL/uL — ABNORMAL LOW (ref 4.22–5.81)
WBC: 12.6 10*3/uL — ABNORMAL HIGH (ref 4.0–10.5)

## 2012-09-27 LAB — URINALYSIS, ROUTINE W REFLEX MICROSCOPIC
Ketones, ur: 15 mg/dL — AB
Nitrite: POSITIVE — AB
Protein, ur: >300 mg/dL — AB
Urobilinogen, UA: 1 mg/dL (ref 0.0–1.0)

## 2012-09-27 LAB — PREPARE RBC (CROSSMATCH)

## 2012-09-27 MED ORDER — DEXTROSE 5 % IV SOLN
1.0000 g | INTRAVENOUS | Status: DC
  Administered 2012-09-27 – 2012-09-30 (×4): 1 g via INTRAVENOUS
  Filled 2012-09-27 (×6): qty 10

## 2012-09-27 MED ORDER — FUROSEMIDE 10 MG/ML IJ SOLN: 40.0000 mg | Freq: Once | INTRAMUSCULAR | Status: DC

## 2012-09-27 MED ORDER — ACETAMINOPHEN 325 MG PO TABS
650.0000 mg | ORAL_TABLET | Freq: Four times a day (QID) | ORAL | Status: DC | PRN
  Administered 2012-09-28: 650 mg via ORAL
  Filled 2012-09-27: qty 2

## 2012-09-27 MED ORDER — FUROSEMIDE 10 MG/ML IJ SOLN
20.0000 mg | Freq: Once | INTRAMUSCULAR | Status: AC
  Administered 2012-09-27: 20 mg via INTRAVENOUS
  Filled 2012-09-27: qty 4

## 2012-09-27 NOTE — Progress Notes (Signed)
[facility] PROGRESS NOTE  NAJIB COLMENARES WUJ:[id number] DOB: [birth year] DOA: 09/23/2012 PCP: [name], MD  HPI/Subjective: Confused, appears comfortable. Tried to call Dr. Clelia Croft before calling hospice, I spoke with family and they're open to palliative/hospice concept.  Assessment/Plan:  Hypercalcemia - secondary to stage IV prostate cancer  Corrected calcium level is 16.3 on 09/24/2012, today corrected calcium level is 11.9 On IV normal saline from the day of admission. He was started on subcutaneous calcitonin, DC because of tachyphylaxis Pamidronate 60 mg IV x1 given on 09/25/2022.  Continue IV fluids, calcium is improving.  Metastatic prostate cancer. PSA >1460 (08/31/2011) -> 623 (11/15/2011) -> 348 (03/18/2012) -> 587 (08/30/2012).  Followed by Dr. Mena Goes - On Firmagon/Casodex. On Xgeva for bone disease.  CT scan 08/31/2011 shows bulky intrathoracic and intraabdominal adenopathy.  He also has a pathologic fx of L4. He had a IR bone tumor ablation 10/17/2011 by Dr. Corliss Skains.  Saw Dr. Clelia Croft (08/30/2012) - for metastatic prostate ca. upon admission to [facility] May 27 a CT scan of the cervical spine indicates numerous blastic metastatic deposits.   Bilateral chronic venous stasis ulcers Medially above his ankles. L>R  He grew out Pseudomonas on cultures in May 2012 and has been on Septra since then from [facility].  For now we did stop antibiotics until clinical status stabilizes. Wound care consult in the hospital was obtained  History of recurrent DVTs.  The patient was On coumadin until his anemia has become too severe for treatment to be safe.  If no major bleeding is noted in hemoglobin remained stable we may resume Coumadin in the hospital.  Currently the patient is on heparin 5000 units 3 times a day for DVT prophylaxis.   Hypertrophic cardiomyopathy Monitor volume status closely  Constipation  Treatment with Senokot, MiraLAX, Colace , secondary to the  hypercalcemia.  Severe protein caloric malnutrition  Weight down about 20 pounds over the last 3 months  He weighed 145 in February, 2014. Weight recorded on admission is 125 pounds Ensure 3 times a day prescribed on admission.   Anemia  Hgb was 5.2 on  08/30/2012 and patient did received one unit of packed red blood cells in the [facility] long cancer Center.  Patient was transfused 2 units of packed RBCs on 09/23/2012. No evidence of obvious bleeding, hemoglobin dropped again to 7.7. I will hold on the transfusion  C 1 cervical spine fracture post fall - Patient was placed in hard cervical collar on admission at the recommendation of Dr. Tressie Stalker for neurosurgery, on call on May 26. Consultation for radiation therapy to be considered if oncology feels worthwhile trying. On the morning of May 27 the patient was found with his cervical collar off after he took it off himself. Save considerable placed at the bedside so the patient cannot remove his cervical collar again.   Hyponatremia - ? Cause - combination of dehydration and Siadh. IV fluids with free water restriction. Continue to monitor  Acute on chronic renal failure-probably prerenal. Started IV fluids with mild improvement by May 27 . followup urine output and creatinine level.    Toxic metabolic encephalopathy from hypercalcemia - sitter at bedside  UTI Started on Rocephin, dyshidrotic according to the culture result.   Code Status: Full code per family request Family Communication: Wife yesterday  Disposition Plan: Unclear   Consultants:  Oncology  Procedures:  CT cervical spine  Antibiotics:    Objective: Filed Vitals:   09/26/12 2225 09/27/12 0530 09/27/12 1050  09/27/12 1125  BP: 100/50 104/48 111/54 111/57  Pulse: 74 78 82 75  Temp: 98.1 F (36.7 C) 98.5 F (36.9 C) 99.2 F (37.3 C) 98.7 F (37.1 C)  TempSrc: Oral Axillary Oral Oral  Resp: 18 16 20 20   Height:      Weight:  56.8 kg (125 lb 3.5 oz)     SpO2: 97% 97%     Patient Vitals for the past 24 hrs:  BP Temp Temp src Pulse Resp SpO2 Weight  09/27/12 1125 111/57 mmHg 98.7 F (37.1 C) Oral 75 20 - -  09/27/12 1050 111/54 mmHg 99.2 F (37.3 C) Oral 82 20 - -  09/27/12 0530 104/48 mmHg 98.5 F (36.9 C) Axillary 78 16 97 % 56.8 kg (125 lb 3.5 oz)  09/26/12 2225 100/50 mmHg 98.1 F (36.7 C) Oral 74 18 97 % -  09/26/12 1601 104/45 mmHg 98.7 F (37.1 C) Axillary 82 18 97 % -     Intake/Output Summary (Last 24 hours) at 09/27/12 1144 Last data filed at 09/27/12 0920  Gross per 24 hour  Intake 5108.33 ml  Output   1600 ml  Net 3508.33 ml   Filed Weights   09/24/12 0217 09/24/12 0518 09/27/12 0530  Weight: 57.9 kg (127 lb 10.3 oz) 57.1 kg (125 lb 14.1 oz) 56.8 kg (125 lb 3.5 oz)    Exam:   General:  Alert, not oriented x3  Cardiovascular: Regular rate and rhythm without murmurs rubs or gallops  Respiratory: Clear to auscultation bilaterally  Abdomen: Excavated, nontender  Musculoskeletal: Tenderness over the spine  Neurological exam-cranial nerves 2-12 intact, strength 4/5 on the left side, strength 5 out of 5 on the right side   Data Reviewed: Basic Metabolic Panel:  Recent Labs Lab 09/23/12 1024 09/24/12 0355 09/25/12 0505 09/26/12 0520 09/27/12 0450  NA 125* 129* 136 141 145  K 5.1 4.5 4.0 3.0* 3.5  CL 91* 95* 104 110 115*  CO2 20 21 20  15* 18*  GLUCOSE 93 80 111* 125* 87  BUN 57* 55* 44* 44* 30*  CREATININE 2.09* 1.93* 1.37* 1.58* 1.08  CALCIUM >15.0* 14.9* 13.6* 12.1* 10.2  PHOS  --   --   --  2.2*  --    Liver Function Tests:  Recent Labs Lab 09/23/12 1024 09/24/12 0355 09/26/12 0520  AST 22 27  --   ALT 6 7  --   ALKPHOS 127* 137*  --   BILITOT 0.2* 0.3  --   PROT 6.1 6.1  --   ALBUMIN 2.2* 2.3* 1.8*   No results found for this basename: LIPASE, AMYLASE,  in the last 168 hours No results found for this basename: AMMONIA,  in the last 168 hours CBC:  Recent Labs Lab  09/23/12 1024 09/23/12 2325 09/24/12 0355 09/25/12 0505 09/26/12 0520 09/27/12 0450  WBC 12.4*  --  13.3* 13.4* 12.9* 12.6*  NEUTROABS 7.7  --   --   --   --   --   HGB 6.0* 9.2* 9.1* 8.4* 7.7* 6.8*  HCT 18.2* 26.7* 26.6* 24.4* 23.0* 20.0*  MCV 93.3  --  85.3 84.7 86.8 86.2  PLT 321  --  310 246 225 199   Cardiac Enzymes:  Recent Labs Lab 09/23/12 1024  TROPONINI <0.30   BNP (last 3 results) No results found for this basename: PROBNP,  in the last 8760 hours CBG:  Recent Labs Lab 09/25/12 2136  GLUCAP 112*    Recent Results (from the  past 240 hour(s))  URINE CULTURE     Status: None   Collection Time    09/23/12 11:10 AM      Result Value Range Status   Specimen Description URINE, RANDOM   Final   Special Requests NONE   Final   Culture  Setup Time 09/23/2012 20:28   Final   Colony Count NO GROWTH   Final   Culture NO GROWTH   Final   Report Status 09/24/2012 FINAL   Final     Studies: No results found.  Scheduled Meds: . antiseptic oral rinse  15 mL Mouth Rinse BID  . bicalutamide  50 mg Oral Daily  . feeding supplement  1 Container Oral TID BM  . furosemide  20 mg Intravenous Once  . heparin  5,000 Units Subcutaneous Q8H  . magnesium hydroxide  30 mL Oral Once  . pantoprazole  40 mg Oral Daily  . polyethylene glycol  34 g Oral BID  . sodium chloride  3 mL Intravenous Q12H   Continuous Infusions: . sodium chloride 100 mL/hr at 09/27/12 9604    Principal Problem:   Hypercalcemia Active Problems:   Hypertrophic obstructive cardiomyopathy(425.11)   Deep vein thrombosis (DVT)   Chronic venous hypertension due to DVT with ulcer and inflammation   Prostate cancer   Hypertension   Hyperlipidemia   Spine metastasis   Unspecified constipation   Anemia   Acute renal failure (ARF)   CKD stage G3b/A1, GFR 30 - 44 and albumin creatinine ratio <30 mg/g   Malnutrition of moderate degree   Closed C1 fracture   Hyponatremia        Rayane Gallardo  A  [facility] Pager [phone]. If 7PM-7AM, please contact night-coverage at [email or web address], password [facility] 09/27/2012, 11:44 AM  LOS: 4 days

## 2012-09-27 NOTE — Progress Notes (Signed)
CRITICAL VALUE ALERT  Critical value received:  Hgb 6.8  Date of notification:  09/27/12  Time of notification:  0606  Critical value read back:yes  Nurse who received alert:  Marlaine Hind  MD notified (1st page):  [place]  Time of first page:  0607  MD notified (2nd page):  Time of second page:  Responding MD:  Georgia Dom  Time MD responded:  [phone]

## 2012-09-27 NOTE — Progress Notes (Signed)
Events noted. I agree with hospice evaluation as his prognosis is poor at this time.

## 2012-09-27 NOTE — Progress Notes (Signed)
Thank you for consulting the Palliative Medicine Team at [facility] to meet your patient's and family's needs.   The reason that you asked [place] to see your patient is for goals of care; hospice options discussion  We have scheduled your patient for a meeting: unable to schedule   The Surrogate decision maker is: patient's wife Evette Georges [phone]  Other family members that need to be present: wife stated all her children have gone home daughter driving back to [place]; son in [place]- cant get here today but may later; wife initially agreeable to having children participate via conference call- then stated they had been talking about this and she was not sure they had to be involved   Your patient is able/unable to participate: patient responded to writer upon introduction saying "I want to know what's happening next" wife asked to speak away from patient saying 'I was hoping he did not have to hear any of this'  Additional Narrative: wife upset during this writer's visit asked if this meeting had to happen today; stated she felt she was being pressured to have discussions and make decisions she did not want to make and needed time to think- offered times to meet this afternoon or over this weekend - wife wanting to meet on Monday and stated she would call the team phone to schedule a time Wife shared 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

## 2012-09-28 LAB — BASIC METABOLIC PANEL
BUN: 22 mg/dL (ref 6–23)
Creatinine, Ser: 0.91 mg/dL (ref 0.50–1.35)
GFR calc non Af Amer: 77 mL/min — ABNORMAL LOW (ref >90)
Glucose, Bld: 100 mg/dL — ABNORMAL HIGH (ref 70–99)
Potassium: 3.6 mEq/L (ref 3.5–5.1)

## 2012-09-28 LAB — CBC
Hemoglobin: 9.5 g/dL — ABNORMAL LOW (ref 13.0–17.0)
MCH: 29.6 pg (ref 26.0–34.0)
MCHC: 34.9 g/dL (ref 30.0–36.0)
RDW: 21.2 % — ABNORMAL HIGH (ref 11.5–15.5)

## 2012-09-28 NOTE — Progress Notes (Signed)
Patient Roy:XWRUEA CYLAN Blackwell      DOB: [birth year]      VWU:[id number]  No call to Palliative phone by patient's spouse.    Jkai Arwood L. Ladona Ridgel, MD MBA The Palliative Medicine Team at [facility] Phone: [phone] Pager: [phone]

## 2012-09-28 NOTE — Progress Notes (Signed)
[facility] PROGRESS NOTE  Roy Blackwell:[id number] DOB: 10/20/1928 DOA: 09/23/2012 PCP: [name], MD  HPI/Subjective: Confused, appears comfortable. Blood work from this morning pending, continue IV fluids. Patient about to be back to his baseline, we will check with wife for disposition.  Assessment/Plan:  Hypercalcemia - secondary to stage IV prostate cancer  Corrected calcium level is 16.3 on 09/24/2012, today corrected calcium level is 11.9 On IV normal saline from the day of admission. He was started on subcutaneous calcitonin, DC because of tachyphylaxis Pamidronate 60 mg IV x1 given on 09/25/2022.  Continue IV fluids, calcium is improving.  Metastatic prostate cancer. PSA >1460 (08/31/2011) -> 623 (11/15/2011) -> 348 (03/18/2012) -> 587 (08/30/2012).  Followed by Dr. Mena Goes - On Firmagon/Casodex. On Xgeva for bone disease.  CT scan 08/31/2011 shows bulky intrathoracic and intraabdominal adenopathy.  He also has a pathologic fx of L4. He had a IR bone tumor ablation 10/17/2011 by Dr. Corliss Skains.  Saw Dr. Clelia Croft (08/30/2012) - for metastatic prostate ca. upon admission to [facility] May 27 a CT scan of the cervical spine indicates numerous blastic metastatic deposits.  Hospice consulted, to meet with family on Monday  Bilateral chronic venous stasis ulcers Medially above his ankles. L>R  He grew out Pseudomonas on cultures in May 2012 and has been on Septra since then from [facility].  For now we did stop antibiotics until clinical status stabilizes. Wound care consult in the hospital was obtained  History of recurrent DVTs.  The patient was On coumadin until his anemia has become too severe for treatment to be safe.  If no major bleeding is noted in hemoglobin remained stable we may resume Coumadin in the hospital.  Currently the patient is on heparin 5000 units 3 times a day for DVT prophylaxis.   Hypertrophic cardiomyopathy Monitor volume status  closely  Constipation  Treatment with Senokot, MiraLAX, Colace , secondary to the hypercalcemia.  Severe protein caloric malnutrition  Weight down about 20 pounds over the last 3 months  He weighed 145 in February, 2014. Weight recorded on admission is 125 pounds Ensure 3 times a day prescribed on admission.   Anemia  Hgb was 5.2 on  08/30/2012 and patient did received one unit of packed red blood cells in the [facility] long cancer Center.  Patient was transfused 2 units of packed RBCs on 09/23/2012. No evidence of obvious bleeding, hemoglobin dropped again to 7.7. I will hold on the transfusion  C 1 cervical spine fracture post fall - Patient was placed in hard cervical collar on admission at the recommendation of Dr. Tressie Stalker for neurosurgery, on call on May 26. Consultation for radiation therapy to be considered if oncology feels worthwhile trying. On the morning of May 27 the patient was found with his cervical collar off after he took it off himself. Save considerable placed at the bedside so the patient cannot remove his cervical collar again.   Hyponatremia - ? Cause - combination of dehydration and Siadh. IV fluids with free water restriction. Continue to monitor  Acute on chronic renal failure-probably prerenal. Started IV fluids with mild improvement by May 27 . followup urine output and creatinine level.    Toxic metabolic encephalopathy from hypercalcemia - sitter at bedside  UTI Started on Rocephin, dyshidrotic according to the culture result.   Code Status: Full code per family request Family Communication: Wife yesterday  Disposition Plan: Unclear   Consultants:  Oncology  Procedures:  CT cervical spine  Antibiotics:  Objective: Filed Vitals:   09/27/12 1635 09/27/12 2103 09/27/12 2158 09/28/12 0528  BP: 114/53 125/38 134/58 116/55  Pulse: 70 75  71  Temp: 98.3 F (36.8 C) 97.7 F (36.5 C)  98.7 F (37.1 C)  TempSrc: Oral Oral  Oral  Resp: 20  20  20   Height:      Weight:    58.5 kg (128 lb 15.5 oz)  SpO2:  94%  96%   Patient Vitals for the past 24 hrs:  BP Temp Temp src Pulse Resp SpO2 Weight  09/28/12 0528 116/55 mmHg 98.7 F (37.1 C) Oral 71 20 96 % 58.5 kg (128 lb 15.5 oz)  09/27/12 2158 134/58 mmHg - - - - - -  09/27/12 2103 125/38 mmHg 97.7 F (36.5 C) Oral 75 20 94 % -  09/27/12 1635 114/53 mmHg 98.3 F (36.8 C) Oral 70 20 - -  09/27/12 1535 122/58 mmHg 99.7 F (37.6 C) Oral 81 20 - -  09/27/12 1435 116/54 mmHg 98.6 F (37 C) Oral 82 20 - -  09/27/12 1415 115/50 mmHg 99 F (37.2 C) Oral 78 20 - -  09/27/12 1354 114/49 mmHg 99.6 F (37.6 C) Oral 77 20 98 % -  09/27/12 1325 114/53 mmHg 99.2 F (37.3 C) Axillary 75 22 98 % -  09/27/12 1300 - 100 F (37.8 C) Oral - - - -  09/27/12 1225 109/53 mmHg 99.1 F (37.3 C) Oral 77 22 97 % -  09/27/12 1125 111/57 mmHg 98.7 F (37.1 C) Oral 75 20 - -  09/27/12 1050 111/54 mmHg 99.2 F (37.3 C) Oral 82 20 - -     Intake/Output Summary (Last 24 hours) at 09/28/12 1025 Last data filed at 09/28/12 0600  Gross per 24 hour  Intake   2520 ml  Output   1550 ml  Net    970 ml   Filed Weights   09/24/12 0518 09/27/12 0530 09/28/12 0528  Weight: 57.1 kg (125 lb 14.1 oz) 56.8 kg (125 lb 3.5 oz) 58.5 kg (128 lb 15.5 oz)    Exam:   General:  Alert, not oriented x3  Cardiovascular: Regular rate and rhythm without murmurs rubs or gallops  Respiratory: Clear to auscultation bilaterally  Abdomen: Excavated, nontender  Musculoskeletal: Tenderness over the spine  Neurological exam-cranial nerves 2-12 intact, strength 4/5 on the left side, strength 5 out of 5 on the right side   Data Reviewed: Basic Metabolic Panel:  Recent Labs Lab 09/23/12 1024 09/24/12 0355 09/25/12 0505 09/26/12 0520 09/27/12 0450  NA 125* 129* 136 141 145  K 5.1 4.5 4.0 3.0* 3.5  CL 91* 95* 104 110 115*  CO2 20 21 20  15* 18*  GLUCOSE 93 80 111* 125* 87  BUN 57* 55* 44* 44* 30*   CREATININE 2.09* 1.93* 1.37* 1.58* 1.08  CALCIUM >15.0* 14.9* 13.6* 12.1* 10.2  PHOS  --   --   --  2.2*  --    Liver Function Tests:  Recent Labs Lab 09/23/12 1024 09/24/12 0355 09/26/12 0520  AST 22 27  --   ALT 6 7  --   ALKPHOS 127* 137*  --   BILITOT 0.2* 0.3  --   PROT 6.1 6.1  --   ALBUMIN 2.2* 2.3* 1.8*   No results found for this basename: LIPASE, AMYLASE,  in the last 168 hours No results found for this basename: AMMONIA,  in the last 168 hours CBC:  Recent Labs Lab 09/23/12 1024  09/23/12 2325 09/24/12 0355 09/25/12 0505 09/26/12 0520 09/27/12 0450  WBC 12.4*  --  13.3* 13.4* 12.9* 12.6*  NEUTROABS 7.7  --   --   --   --   --   HGB 6.0* 9.2* 9.1* 8.4* 7.7* 6.8*  HCT 18.2* 26.7* 26.6* 24.4* 23.0* 20.0*  MCV 93.3  --  85.3 84.7 86.8 86.2  PLT 321  --  310 246 225 199   Cardiac Enzymes:  Recent Labs Lab 09/23/12 1024  TROPONINI <0.30   BNP (last 3 results) No results found for this basename: PROBNP,  in the last 8760 hours CBG:  Recent Labs Lab 09/25/12 2136  GLUCAP 112*    Recent Results (from the past 240 hour(s))  URINE CULTURE     Status: None   Collection Time    09/23/12 11:10 AM      Result Value Range Status   Specimen Description URINE, RANDOM   Final   Special Requests NONE   Final   Culture  Setup Time 09/23/2012 20:28   Final   Colony Count NO GROWTH   Final   Culture NO GROWTH   Final   Report Status 09/24/2012 FINAL   Final     Studies: No results found.  Scheduled Meds: . antiseptic oral rinse  15 mL Mouth Rinse BID  . bicalutamide  50 mg Oral Daily  . cefTRIAXone (ROCEPHIN)  IV  1 g Intravenous Q24H  . feeding supplement  1 Container Oral TID BM  . heparin  5,000 Units Subcutaneous Q8H  . magnesium hydroxide  30 mL Oral Once  . pantoprazole  40 mg Oral Daily  . polyethylene glycol  34 g Oral BID  . sodium chloride  3 mL Intravenous Q12H   Continuous Infusions: . sodium chloride 100 mL/hr at 09/28/12 8295     Principal Problem:   Hypercalcemia Active Problems:   Hypertrophic obstructive cardiomyopathy(425.11)   Deep vein thrombosis (DVT)   Chronic venous hypertension due to DVT with ulcer and inflammation   Prostate cancer   Hypertension   Hyperlipidemia   Spine metastasis   Unspecified constipation   Anemia   Acute renal failure (ARF)   CKD stage G3b/A1, GFR 30 - 44 and albumin creatinine ratio <30 mg/g   Malnutrition of moderate degree   Closed C1 fracture   Hyponatremia        Retal Tonkinson A  [facility] Pager [phone]. If 7PM-7AM, please contact night-coverage at [email or web address], password [facility] 09/28/2012, 10:25 AM  LOS: 5 days

## 2012-09-29 DIAGNOSIS — N390 Urinary tract infection, site not specified: Secondary | ICD-10-CM

## 2012-09-29 LAB — BASIC METABOLIC PANEL
BUN: 18 mg/dL (ref 6–23)
Calcium: 8.5 mg/dL (ref 8.4–10.5)
GFR calc Af Amer: >90 mL/min (ref >90)
GFR calc non Af Amer: 80 mL/min — ABNORMAL LOW (ref >90)
Glucose, Bld: 90 mg/dL (ref 70–99)
Sodium: 140 mEq/L (ref 135–145)

## 2012-09-29 LAB — URINE CULTURE: Colony Count: >=100000

## 2012-09-29 LAB — TYPE AND SCREEN: Antibody Screen: NEGATIVE

## 2012-09-29 LAB — CBC
MCH: 29.6 pg (ref 26.0–34.0)
MCHC: 34 g/dL (ref 30.0–36.0)
Platelets: 132 10*3/uL — ABNORMAL LOW (ref 150–400)
RBC: 3.24 MIL/uL — ABNORMAL LOW (ref 4.22–5.81)

## 2012-09-29 MED ORDER — POTASSIUM CHLORIDE CRYS ER 20 MEQ PO TBCR
40.0000 meq | EXTENDED_RELEASE_TABLET | Freq: Four times a day (QID) | ORAL | Status: AC
  Administered 2012-09-29 (×2): 40 meq via ORAL
  Filled 2012-09-29 (×2): qty 2

## 2012-09-29 MED ORDER — HALOPERIDOL LACTATE 5 MG/ML IJ SOLN
0.5000 mg | Freq: Four times a day (QID) | INTRAMUSCULAR | Status: DC | PRN
  Administered 2012-09-29: 0.5 mg via INTRAVENOUS
  Filled 2012-09-29: qty 0.1

## 2012-09-29 NOTE — Progress Notes (Signed)
[facility] PROGRESS NOTE  Roy Blackwell ZOX:[id number] DOB: [birth year] DOA: 09/23/2012 PCP: [name], MD  HPI/Subjective: Confused, appears comfortable. From palliative team notes seems the family is hesitant about hospice. From medical standpoint patient is probably ready for discharge in a.m., will check with family about disposition  Assessment/Plan:  Hypercalcemia - secondary to stage IV prostate cancer  Corrected calcium level is 16.3 on 09/24/2012, today corrected calcium level is 11.9 On IV normal saline from the day of admission. He was started on subcutaneous calcitonin, DC because of tachyphylaxis Pamidronate 60 mg IV x1 given on 09/25/2022.  Continue IV fluids, calcium is improving.  Metastatic prostate cancer. PSA >1460 (08/31/2011) -> 623 (11/15/2011) -> 348 (03/18/2012) -> 587 (08/30/2012).  Followed by Dr. Mena Goes - On Firmagon/Casodex. On Xgeva for bone disease.  CT scan 08/31/2011 shows bulky intrathoracic and intraabdominal adenopathy.  He also has a pathologic fx of L4. He had a IR bone tumor ablation 10/17/2011 by Dr. Corliss Skains.  Saw Dr. Clelia Croft (08/30/2012) - for metastatic prostate ca. upon admission to [facility] May 27 a CT scan of the cervical spine indicates numerous blastic metastatic deposits.  Hospice consulted, to meet with family on Monday  Bilateral chronic venous stasis ulcers Medially above his ankles. L>R  He grew out Pseudomonas on cultures in May 2012 and has been on Septra since then from [facility].  For now we did stop antibiotics until clinical status stabilizes. Wound care consult in the hospital was obtained  History of recurrent DVTs.  The patient was On coumadin until his anemia has become too severe for treatment to be safe.  If no major bleeding is noted in hemoglobin remained stable we may resume Coumadin in the hospital.  Currently the patient is on heparin 5000 units 3 times a day for DVT prophylaxis.   Hypertrophic  cardiomyopathy Monitor volume status closely  Constipation  Treatment with Senokot, MiraLAX, Colace , secondary to the hypercalcemia.  Severe protein caloric malnutrition  Weight down about 20 pounds over the last 3 months  He weighed 145 in February, 2014. Weight recorded on admission is 125 pounds Ensure 3 times a day prescribed on admission.   Anemia  Hgb was 5.2 on  08/30/2012 and patient did received one unit of packed red blood cells in the [facility] long cancer Center.  Patient was transfused 2 units of packed RBCs on 09/23/2012. No evidence of obvious bleeding, hemoglobin dropped again to 7.7. I will hold on the transfusion  C 1 cervical spine fracture post fall - Patient was placed in hard cervical collar on admission at the recommendation of Dr. Tressie Stalker for neurosurgery, on call on May 26. Consultation for radiation therapy to be considered if oncology feels worthwhile trying. On the morning of May 27 the patient was found with his cervical collar off after he took it off himself. Save considerable placed at the bedside so the patient cannot remove his cervical collar again.   Hyponatremia - ? Cause - combination of dehydration and Siadh. IV fluids with free water restriction. Continue to monitor  Acute on chronic renal failure-probably prerenal. Started IV fluids with mild improvement by May 27 . followup urine output and creatinine level.    Toxic metabolic encephalopathy from hypercalcemia - sitter at bedside  UTI Started on Rocephin, urine culture showed GNR, susceptibilities pending   Code Status: Full code per family request Family Communication: Wife yesterday  Disposition Plan: Unclear   Consultants:  Oncology  Procedures:  CT  cervical spine  Antibiotics:    Objective: Filed Vitals:   09/28/12 0528 09/28/12 1309 09/28/12 2037 09/29/12 0443  BP: 116/55 117/57 111/49 118/43  Pulse: 71 94 83 75  Temp: 98.7 F (37.1 C) 100.1 F (37.8 C) 99.4 F  (37.4 C) 98.4 F (36.9 C)  TempSrc: Oral Oral Axillary Oral  Resp: 20 18 18 18   Height:      Weight: 58.5 kg (128 lb 15.5 oz)   59.603 kg (131 lb 6.4 oz)  SpO2: 96% 95% 92% 95%   Patient Vitals for the past 24 hrs:  BP Temp Temp src Pulse Resp SpO2 Weight  09/29/12 0443 118/43 mmHg 98.4 F (36.9 C) Oral 75 18 95 % 59.603 kg (131 lb 6.4 oz)  09/28/12 2037 111/49 mmHg 99.4 F (37.4 C) Axillary 83 18 92 % -  09/28/12 1309 117/57 mmHg 100.1 F (37.8 C) Oral 94 18 95 % -     Intake/Output Summary (Last 24 hours) at 09/29/12 1129 Last data filed at 09/29/12 0457  Gross per 24 hour  Intake 2274.58 ml  Output    752 ml  Net 1522.58 ml   Filed Weights   09/27/12 0530 09/28/12 0528 09/29/12 0443  Weight: 56.8 kg (125 lb 3.5 oz) 58.5 kg (128 lb 15.5 oz) 59.603 kg (131 lb 6.4 oz)    Exam:   General:  Alert, not oriented x3  Cardiovascular: Regular rate and rhythm without murmurs rubs or gallops  Respiratory: Clear to auscultation bilaterally  Abdomen: Excavated, nontender  Musculoskeletal: Tenderness over the spine  Neurological exam-cranial nerves 2-12 intact, strength 4/5 on the left side, strength 5 out of 5 on the right side   Data Reviewed: Basic Metabolic Panel:  Recent Labs Lab 09/25/12 0505 09/26/12 0520 09/27/12 0450 09/28/12 1000 09/29/12 0550  NA 136 141 145 144 140  K 4.0 3.0* 3.5 3.6 3.1*  CL 104 110 115* 112 109  CO2 20 15* 18* 19 19  GLUCOSE 111* 125* 87 100* 90  BUN 44* 44* 30* 22 18  CREATININE 1.37* 1.58* 1.08 0.91 0.83  CALCIUM 13.6* 12.1* 10.2 8.9 8.5  PHOS  --  2.2*  --   --   --    Liver Function Tests:  Recent Labs Lab 09/23/12 1024 09/24/12 0355 09/26/12 0520  AST 22 27  --   ALT 6 7  --   ALKPHOS 127* 137*  --   BILITOT 0.2* 0.3  --   PROT 6.1 6.1  --   ALBUMIN 2.2* 2.3* 1.8*   No results found for this basename: LIPASE, AMYLASE,  in the last 168 hours No results found for this basename: AMMONIA,  in the last 168  hours CBC:  Recent Labs Lab 09/23/12 1024  09/25/12 0505 09/26/12 0520 09/27/12 0450 09/28/12 1000 09/29/12 0550  WBC 12.4*  < > 13.4* 12.9* 12.6* 13.5* 13.5*  NEUTROABS 7.7  --   --   --   --   --   --   HGB 6.0*  < > 8.4* 7.7* 6.8* 9.5* 9.6*  HCT 18.2*  < > 24.4* 23.0* 20.0* 27.2* 28.2*  MCV 93.3  < > 84.7 86.8 86.2 84.7 87.0  PLT 321  < > 246 225 199 158 132*  < > = values in this interval not displayed. Cardiac Enzymes:  Recent Labs Lab 09/23/12 1024  TROPONINI <0.30   BNP (last 3 results) No results found for this basename: PROBNP,  in the last 8760 hours  CBG:  Recent Labs Lab 09/25/12 2136  GLUCAP 112*    Recent Results (from the past 240 hour(s))  URINE CULTURE     Status: None   Collection Time    09/23/12 11:10 AM      Result Value Range Status   Specimen Description URINE, RANDOM   Final   Special Requests NONE   Final   Culture  Setup Time 09/23/2012 20:28   Final   Colony Count NO GROWTH   Final   Culture NO GROWTH   Final   Report Status 09/24/2012 FINAL   Final  URINE CULTURE     Status: None   Collection Time    09/27/12  9:38 AM      Result Value Range Status   Specimen Description URINE, CLEAN CATCH   Final   Special Requests NONE   Final   Culture  Setup Time 09/27/2012 10:17   Final   Colony Count >=100,000 COLONIES/ML   Final   Culture GRAM NEGATIVE RODS   Final   Report Status PENDING   Incomplete     Studies: No results found.  Scheduled Meds: . antiseptic oral rinse  15 mL Mouth Rinse BID  . bicalutamide  50 mg Oral Daily  . cefTRIAXone (ROCEPHIN)  IV  1 g Intravenous Q24H  . feeding supplement  1 Container Oral TID BM  . heparin  5,000 Units Subcutaneous Q8H  . magnesium hydroxide  30 mL Oral Once  . pantoprazole  40 mg Oral Daily  . polyethylene glycol  34 g Oral BID  . potassium chloride  40 mEq Oral Q6H  . sodium chloride  3 mL Intravenous Q12H   Continuous Infusions: . sodium chloride 1,000 mL (09/29/12 0704)     Principal Problem:   Hypercalcemia Active Problems:   Hypertrophic obstructive cardiomyopathy(425.11)   Deep vein thrombosis (DVT)   Chronic venous hypertension due to DVT with ulcer and inflammation   Prostate cancer   Hypertension   Hyperlipidemia   Spine metastasis   Unspecified constipation   Anemia   Acute renal failure (ARF)   CKD stage G3b/A1, GFR 30 - 44 and albumin creatinine ratio <30 mg/g   Malnutrition of moderate degree   Closed C1 fracture   Hyponatremia        Kenton Fortin A  [facility] Pager [phone]. If 7PM-7AM, please contact night-coverage at [email or web address], password [facility] 09/29/2012, 11:29 AM  LOS: 6 days

## 2012-09-30 ENCOUNTER — Inpatient Hospital Stay (HOSPITAL_COMMUNITY): Payer: Medicare Other

## 2012-09-30 DIAGNOSIS — R531 Weakness: Secondary | ICD-10-CM

## 2012-09-30 DIAGNOSIS — R5383 Other fatigue: Secondary | ICD-10-CM

## 2012-09-30 DIAGNOSIS — C801 Malignant (primary) neoplasm, unspecified: Secondary | ICD-10-CM

## 2012-09-30 DIAGNOSIS — R1319 Other dysphagia: Secondary | ICD-10-CM

## 2012-09-30 DIAGNOSIS — R627 Adult failure to thrive: Secondary | ICD-10-CM

## 2012-09-30 DIAGNOSIS — Z515 Encounter for palliative care: Secondary | ICD-10-CM

## 2012-09-30 DIAGNOSIS — C7951 Secondary malignant neoplasm of bone: Secondary | ICD-10-CM

## 2012-09-30 DIAGNOSIS — R638 Other symptoms and signs concerning food and fluid intake: Secondary | ICD-10-CM

## 2012-09-30 LAB — RENAL FUNCTION PANEL
Calcium: 9 mg/dL (ref 8.4–10.5)
GFR calc Af Amer: >90 mL/min (ref >90)
GFR calc non Af Amer: 79 mL/min — ABNORMAL LOW (ref >90)
Phosphorus: 3 mg/dL (ref 2.3–4.6)
Potassium: 4 mEq/L (ref 3.5–5.1)
Sodium: 142 mEq/L (ref 135–145)

## 2012-09-30 MED ORDER — FUROSEMIDE 10 MG/ML IJ SOLN
INTRAMUSCULAR | Status: AC
  Filled 2012-09-30: qty 4

## 2012-09-30 MED ORDER — FUROSEMIDE 10 MG/ML IJ SOLN
20.0000 mg | Freq: Once | INTRAMUSCULAR | Status: AC
  Administered 2012-09-30: 20 mg via INTRAVENOUS

## 2012-09-30 MED ORDER — HALOPERIDOL LACTATE 5 MG/ML IJ SOLN
1.0000 mg | Freq: Four times a day (QID) | INTRAMUSCULAR | Status: DC | PRN
  Administered 2012-09-30: 1 mg via INTRAVENOUS
  Filled 2012-09-30 (×2): qty 0.2

## 2012-09-30 MED ORDER — FUROSEMIDE 10 MG/ML IJ SOLN
40.0000 mg | Freq: Once | INTRAMUSCULAR | Status: AC
  Administered 2012-09-30: 40 mg via INTRAVENOUS
  Filled 2012-09-30 (×2): qty 4

## 2012-09-30 NOTE — Progress Notes (Signed)
Patient UJ:[id number] R Roy Blackwell      DOB: 13-Apr-1929      NWG:[id number]  Family asserted they would call the PMT phone if they wanted to meet.  No call to the phone will attempt to check in with them in the am.  Avagrace Botelho L. Ladona Ridgel, MD MBA The Palliative Medicine Team at [facility] Phone: [phone] Pager: [phone]

## 2012-09-30 NOTE — Progress Notes (Signed)
Physical Therapy Treatment Patient Details Name: Roy Blackwell MRN: [id number] DOB: [birth year] Today's Date: 09/30/2012 Time: 9562-1308 PT Time Calculation (min): 23 min  PT Assessment / Plan / Recommendation Comments on Treatment Session  Pt pleasant and cooperative with PT today, but still with random verbalizations that indicate he is disoriented.  Pt was able to sit on edge of bed and stand with +2 assist but demonstrated decreased activity tolerance.  Continue to recommend SNF, but if wife wants to take him home she will need max HH services and equipment.  Will further define recommendations when d/c plan is estabilished    Follow Up Recommendations  SNF;Supervision/Assistance - 24 hour     Does the patient have the potential to tolerate intense rehabilitation     Barriers to Discharge        Equipment Recommendations  Other (comment) (3 in 1)    Recommendations for Other Services    Frequency Min 3X/week   Plan Discharge plan remains appropriate;Frequency remains appropriate    Precautions / Restrictions Precautions Precautions: Fall Required Braces or Orthoses: Cervical Brace Cervical Brace: Hard collar;Other (comment) Restrictions Weight Bearing Restrictions: No   Pertinent Vitals/Pain No c/o pain    Mobility  Bed Mobility Bed Mobility: Rolling Right;Supine to Sit;Sitting - Scoot to Edge of Bed;Sit to Supine Supine to Sit: 1: +2 Total assist;HOB elevated Supine to Sit: Patient Percentage: 30% Sitting - Scoot to Edge of Bed: 2: Max assist Sit to Supine: 1: +2 Total assist Sit to Supine: Patient Percentage: 10% Details for Bed Mobility Assistance: Pt cooperative with mobilty to get to edge of bed today. Used pad to assist moving patient Transfers Transfers: Sit to Stand;Stand to Sit Sit to Stand: 1: +2 Total assist Sit to Stand: Patient Percentage: 30% Stand to Sit: 1: +2 Total assist Stand to Sit: Patient Percentage: 30% Details for Transfer Assistance: pt  needed assist at pad to lift up into standing.  Once in standing, he was able to maintain position using RW for UE support Ambulation/Gait Ambulation/Gait Assistance: Not tested (comment) Assistive device: Rolling walker Stairs: No Wheelchair Mobility Wheelchair Mobility: No    Exercises General Exercises - Upper Extremity Shoulder Flexion: Right;5 reps;AAROM (left arm is in elbow extension to protect IV) Elbow Flexion: Right;5 reps;AAROM Elbow Extension: Right;AAROM Wrist Flexion: Right;5 reps;AAROM Wrist Extension: Right;5 reps;AAROM General Exercises - Lower Extremity Ankle Circles/Pumps: AAROM;Both;5 reps;Supine Hip ABduction/ADduction: AAROM;Both;10 reps;Supine Hip Flexion/Marching: AAROM;Both;10 reps;Supine   PT Diagnosis:    PT Problem List:   PT Treatment Interventions:     PT Goals Acute Rehab PT Goals PT Goal Formulation: Patient unable to participate in goal setting Time For Goal Achievement: 10/01/12 Potential to Achieve Goals: Fair Pt will go Supine/Side to Sit: with min assist PT Goal: Supine/Side to Sit - Progress: Progressing toward goal Pt will go Sit to Supine/Side: with min assist PT Goal: Sit to Supine/Side - Progress: Progressing toward goal Pt will go Sit to Stand: with mod assist PT Goal: Sit to Stand - Progress: Progressing toward goal Pt will go Stand to Sit: with mod assist PT Goal: Stand to Sit - Progress: Progressing toward goal Pt will Transfer Bed to Chair/Chair to Bed: with mod assist  Visit Information  Last PT Received On: 09/30/12 Assistance Needed: +2    Subjective Data  Subjective: Pt pleasant, cooperatiave with PT today Patient Stated Goal: unable to state   Cognition  Cognition Arousal/Alertness: Awake/alert Behavior During Therapy: WFL for tasks assessed/performed Overall Cognitive Status: Impaired/Different  from baseline Orientation Level: Disoriented to;Place;Time;Situation;Person Current Attention Level: Focused Following  Commands: Follows one step commands consistently Problem Solving: Slow processing;Decreased initiation;Difficulty sequencing;Requires verbal cues;Requires tactile cues General Comments: pt is able to cooperate with PT with manual guidance    Balance  Balance Balance Assessed: Yes Static Sitting Balance Static Sitting - Balance Support: Bilateral upper extremity supported Static Sitting - Level of Assistance: 5: Stand by assistance Static Sitting - Comment/# of Minutes: 3 minutes. Pt with cervical collar in place and maintains flexed posture Static Standing Balance Static Standing - Balance Support: Bilateral upper extremity supported Static Standing - Level of Assistance: 4: Min assist Static Standing - Comment/# of Minutes: 15 sec x 2 .  Pt with some increased dyspnea with activity  End of Session PT - End of Session Equipment Utilized During Treatment: Cervical collar Activity Tolerance: Patient limited by fatigue Patient left: in bed;with call bell/phone within reach;with family/visitor present   GP    Rosey [address], [place] [phone] 09/30/2012, 10:03 AM

## 2012-09-30 NOTE — Progress Notes (Signed)
MD notified patient was having SOB with expiratory wheezes.  He also has increased agitation.  Orders received and will be addressed.  Will continue to monitor patient.

## 2012-09-30 NOTE — Progress Notes (Signed)
[facility] PROGRESS NOTE  Roy Blackwell:[id number] DOB: 1928/11/08 DOA: 09/23/2012 PCP: [name], MD  HPI/Subjective: Confused, appears comfortable. Wife is at bedside, he has some SOB earlier today, CXR showed some congestion  Assessment/Plan:  Hypercalcemia - secondary to stage IV prostate cancer  Corrected calcium level is 16.3 on 09/24/2012, today corrected calcium level is 11.9 On IV normal saline from the day of admission. He was started on subcutaneous calcitonin, DC because of tachyphylaxis Pamidronate 60 mg IV x1 given on 09/25/2022.  Continue IV fluids, calcium is improving.  Metastatic prostate cancer. PSA >1460 (08/31/2011) -> 623 (11/15/2011) -> 348 (03/18/2012) -> 587 (08/30/2012).  Followed by Dr. Mena Goes - On Firmagon/Casodex. On Xgeva for bone disease.  CT scan 08/31/2011 shows bulky intrathoracic and intraabdominal adenopathy.  He also has a pathologic fx of L4. He had a IR bone tumor ablation 10/17/2011 by Dr. Corliss Skains.  Saw Dr. Clelia Croft (08/30/2012) - for metastatic prostate ca. upon admission to [facility] May 27 a CT scan of the cervical spine indicates numerous blastic metastatic deposits.  Hospice consulted, to meet with family on Monday  Bilateral chronic venous stasis ulcers Medially above his ankles. L>R  He grew out Pseudomonas on cultures in May 2012 and has been on Septra since then from [facility].  For now we did stop antibiotics until clinical status stabilizes. Wound care consult in the hospital was obtained  History of recurrent DVTs.  The patient was On coumadin until his anemia has become too severe for treatment to be safe.  If no major bleeding is noted in hemoglobin remained stable we may resume Coumadin in the hospital.  Currently the patient is on heparin 5000 units 3 times a day for DVT prophylaxis.   Hypertrophic cardiomyopathy Monitor volume status closely Chest x-ray showed some congestion, IV fluids discontinued and started on  Lasix 09/30/12  Constipation  Treatment with Senokot, MiraLAX, Colace , secondary to the hypercalcemia.  Severe protein caloric malnutrition  Weight down about 20 pounds over the last 3 months  He weighed 145 in February, 2014. Weight recorded on admission is 125 pounds Ensure 3 times a day prescribed on admission.   Anemia  Hgb was 5.2 on  08/30/2012 and patient did received one unit of packed red blood cells in the [facility] long cancer Center.  Patient was transfused 2 units of packed RBCs on 09/23/2012. No evidence of obvious bleeding, hemoglobin dropped again to 7.7. I will hold on the transfusion  C 1 cervical spine fracture post fall - Patient was placed in hard cervical collar on admission at the recommendation of Dr. Tressie Stalker for neurosurgery, on call on May 26. Consultation for radiation therapy to be considered if oncology feels worthwhile trying. On the morning of May 27 the patient was found with his cervical collar off after he took it off himself. Save considerable placed at the bedside so the patient cannot remove his cervical collar again.   Hyponatremia - ? Cause - combination of dehydration and Siadh. IV fluids with free water restriction. Continue to monitor  Acute on chronic renal failure-probably prerenal. Started IV fluids with mild improvement by May 27 . followup urine output and creatinine level.    Toxic metabolic encephalopathy from hypercalcemia - sitter at bedside  UTI Started on Rocephin, urine culture showed CITROBACTER FREUNDII   Code Status: Full code per family request Family Communication: Wife yesterday  Disposition Plan: Unclear   Consultants:  Oncology  Procedures:  CT cervical spine  Antibiotics:    Objective: Filed Vitals:   09/30/12 0245 09/30/12 0250 09/30/12 0500 09/30/12 0504  BP: 153/75   131/58  Pulse: 90   96  Temp: 99 F (37.2 C)   100.6 F (38.1 C)  TempSrc: Oral   Oral  Resp: 34 28  26  Height:      Weight:    59.6 kg (131 lb 6.3 oz)   SpO2: 92% 91%  94%   Patient Vitals for the past 24 hrs:  BP Temp Temp src Pulse Resp SpO2 Weight  09/30/12 0504 131/58 mmHg 100.6 F (38.1 C) Oral 96 26 94 % -  09/30/12 0500 - - - - - - 59.6 kg (131 lb 6.3 oz)  09/30/12 0250 - - - - 28 91 % -  09/30/12 0245 153/75 mmHg 99 F (37.2 C) Oral 90 34 92 % -  09/29/12 1430 128/47 mmHg 98.5 F (36.9 C) Oral 83 16 96 % -     Intake/Output Summary (Last 24 hours) at 09/30/12 1112 Last data filed at 09/30/12 0900  Gross per 24 hour  Intake   2090 ml  Output    926 ml  Net   1164 ml   Filed Weights   09/28/12 0528 09/29/12 0443 09/30/12 0500  Weight: 58.5 kg (128 lb 15.5 oz) 59.603 kg (131 lb 6.4 oz) 59.6 kg (131 lb 6.3 oz)    Exam:   General:  Alert, not oriented x3  Cardiovascular: Regular rate and rhythm without murmurs rubs or gallops  Respiratory: Clear to auscultation bilaterally  Abdomen: Excavated, nontender  Musculoskeletal: Tenderness over the spine  Neurological exam-cranial nerves 2-12 intact, strength 4/5 on the left side, strength 5 out of 5 on the right side   Data Reviewed: Basic Metabolic Panel:  Recent Labs Lab 09/25/12 0505 09/26/12 0520 09/27/12 0450 09/28/12 1000 09/29/12 0550 09/30/12 0540  NA 136 141 145 144 140 142  K 4.0 3.0* 3.5 3.6 3.1* 4.0  CL 104 110 115* 112 109 111  CO2 20 15* 18* 19 19 16*  GLUCOSE 111* 125* 87 100* 90 95  BUN 44* 44* 30* 22 18 21   CREATININE 1.37* 1.58* 1.08 0.91 0.83 0.86  CALCIUM 13.6* 12.1* 10.2 8.9 8.5 9.0  PHOS  --  2.2*  --   --   --  3.0   Liver Function Tests:  Recent Labs Lab 09/24/12 0355 09/26/12 0520 09/30/12 0540  AST 27  --   --   ALT 7  --   --   ALKPHOS 137*  --   --   BILITOT 0.3  --   --   PROT 6.1  --   --   ALBUMIN 2.3* 1.8* 2.0*   No results found for this basename: LIPASE, AMYLASE,  in the last 168 hours No results found for this basename: AMMONIA,  in the last 168 hours CBC:  Recent Labs Lab  09/25/12 0505 09/26/12 0520 09/27/12 0450 09/28/12 1000 09/29/12 0550  WBC 13.4* 12.9* 12.6* 13.5* 13.5*  HGB 8.4* 7.7* 6.8* 9.5* 9.6*  HCT 24.4* 23.0* 20.0* 27.2* 28.2*  MCV 84.7 86.8 86.2 84.7 87.0  PLT 246 225 199 158 132*   Cardiac Enzymes: No results found for this basename: CKTOTAL, CKMB, CKMBINDEX, TROPONINI,  in the last 168 hours BNP (last 3 results) No results found for this basename: PROBNP,  in the last 8760 hours CBG:  Recent Labs Lab 09/25/12 2136  GLUCAP 112*    Recent Results (  from the past 240 hour(s))  URINE CULTURE     Status: None   Collection Time    09/23/12 11:10 AM      Result Value Range Status   Specimen Description URINE, RANDOM   Final   Special Requests NONE   Final   Culture  Setup Time 09/23/2012 20:28   Final   Colony Count NO GROWTH   Final   Culture NO GROWTH   Final   Report Status 09/24/2012 FINAL   Final  URINE CULTURE     Status: None   Collection Time    09/27/12  9:38 AM      Result Value Range Status   Specimen Description URINE, CLEAN CATCH   Final   Special Requests NONE   Final   Culture  Setup Time 09/27/2012 10:17   Final   Colony Count >=100,000 COLONIES/ML   Final   Culture CITROBACTER FREUNDII   Final   Report Status 09/29/2012 FINAL   Final   Organism ID, Bacteria CITROBACTER FREUNDII   Final     Studies: Dg Chest Port 1 View  09/30/2012   *RADIOLOGY REPORT*  Clinical Data: Wheezing and shortness of breath.  PORTABLE CHEST - 1 VIEW  Comparison: Chest radiograph performed 09/23/2012  Findings: The lungs are well-aerated.  Worsening right basilar airspace opacification is concerning for worsening pneumonia.  More mild left midlung airspace opacities again seen.  Vascular congestion is noted; interstitial edema could have a similar appearance, but is considered less likely.  A small right pleural effusion is seen.  No pneumothorax is identified.  The cardiomediastinal silhouette is mildly enlarged.  No acute osseous  abnormalities are seen.  IMPRESSION:  1.  Worsening right basilar airspace opacification, concerning for worsening pneumonia; more mild left midlung airspace opacities again seen. 2.  Vascular congestion and mild cardiomegaly; interstitial edema could have a similar appearance, but is considered less likely. Small right pleural effusion seen.   Original Report Authenticated By: Tonia Ghent, M.D.    Scheduled Meds: . antiseptic oral rinse  15 mL Mouth Rinse BID  . bicalutamide  50 mg Oral Daily  . cefTRIAXone (ROCEPHIN)  IV  1 g Intravenous Q24H  . feeding supplement  1 Container Oral TID BM  . furosemide      . furosemide  40 mg Intravenous Once  . heparin  5,000 Units Subcutaneous Q8H  . magnesium hydroxide  30 mL Oral Once  . pantoprazole  40 mg Oral Daily  . polyethylene glycol  34 g Oral BID  . sodium chloride  3 mL Intravenous Q12H   Continuous Infusions:    Principal Problem:   Hypercalcemia Active Problems:   Hypertrophic obstructive cardiomyopathy(425.11)   Deep vein thrombosis (DVT)   Chronic venous hypertension due to DVT with ulcer and inflammation   Prostate cancer   Hypertension   Hyperlipidemia   Spine metastasis   Unspecified constipation   Anemia   Acute renal failure (ARF)   CKD stage G3b/A1, GFR 30 - 44 and albumin creatinine ratio <30 mg/g   Malnutrition of moderate degree   Closed C1 fracture   Hyponatremia   UTI (urinary tract infection)        [facility] A  [facility] Pager [phone]. If 7PM-7AM, please contact night-coverage at [email or web address], password [facility] 09/30/2012, 11:12 AM  LOS: 7 days

## 2012-09-30 NOTE — Progress Notes (Addendum)
Patient Roy Blackwell      DOB: 1928/07/13      GNF:[id number]     Consult Note from the Palliative Medicine Team at [facility]    Consult Requested by: Dr Arthor Captain    PCP: [name], MD Reason for Consultation:Goals of Care     Phone Number:[phone]  Assessment of patients Current state: Patient lying in bed, eyes open, alert and verbally responsive to questions, responses appropriate. Denies any pain, anxiety or dyspnea.  Reviewed chart spoke with staff caring for patient and proceeded to have Goals of Care meeting initially with wife Roy Blackwell away from bedside at her request, then at bedside with patient. Patients wife stated they have a daughter Roy Blackwell, who lives in [place] and son, Roy Blackwell that lives in [place], [place]. She did not want them involved in PMT discussion today. I gave her PMT contact information and encouraged her to have children call PMT phone to talk with me about patients medical status or decision regarding goals of care made today.  We talked about patient current medical problems and treatment being done. Also discussed patients decline over past few months in the context of progressive metastatic prostate cancer. Per wife patient has has 3-4 episodes of falls over past month, most recent one occurring prior to hospitalization.   Discussed the philosophy of palliative care and hospice support and services provided. Also engaged in decision-making with patient/wife regarding concepts of Medical Orders for Scope of Treatment pertaining to cardiac and pulmonary resuscitation, desire for acute future medical interventions for issues, the use of antibiotic therapy, intravenous hydration and continuing artificial tube feedings. A MOST form was completed and placed on chart with [facility] DNR form.  Educated wife on the support that Palliative Care services of [facility] provides at SNF, and that decision to transition care for patient to full hospice support  with future decline can be facilitated through Palliative Care services.    Dr. Arthor Captain called made aware of decisions made regarding Goals of Care  Goals of Care: 1.  Code Status:DNR/DNI   2. Scope of Treatment: 1. Vital Signs:routine  2. Respiratory/Oxygen:support as indicated 3. Nutritional Support/Tube Feeds: No artificial means of support for nutrition 4. Antibiotics:as indicated for defined infections 5. Blood Products: as indicated 6. IVF: for defined period of time as indicated 7. Review of Medications to be discontinued:none 8. Labs: as indicated 9. Telemetry: as indicated   4. Disposition:recommend discharge to SNF/rehabilitation  With palliative Care services to follow   3. Symptom Management:   1. Anxiety/Agitation:Haldol 1 mg IV every 6 hours as needed 2. Pain:Tylenol 650 mg every 8 hours as needed 3. Dysphagia: due to hard cervical collar-on modified Dysphagia Diet 4. Bowel Regimen:Miralax oral twice daily  4. Psychosocial:Patient lived with wife prior to this admission. Emotional support and education rendered tpatient and wife on current medical issues and also with goals of care decisions  5. Spiritual:declined chaplin intervention, has Education officer, environmental   Patient Documents Completed or Given: Document Given Completed  Advanced Directives Pkt    MOST  Yes  DNR  Yes  Gone from My Sight    Hard Choices None available     Brief HPI: Patient is an 79 yo AAM, with PMH: Prostate Cancer (diagnosed 1992), now with advanced disease to bones, s/p vertebroplasty 09/2011, also has history of DVT, was on Coumadin, which was stopped due to sever anemia.  Admitted 09/23/12 s/p fall at home, found to have hypercalcemia   ROS: +  decreased appetite,denies, pain, anxiety, dysphagia, constipation  PMH:  Past Medical History  Diagnosis Date  . Hypertrophic cardiomyopathy   . Prostate cancer 08/27/2011  . Hypertension 08/27/2011  . Pulmonary nodule 08/27/2011    2008  .  Hyperlipidemia 08/27/2011  . Inguinal lymphadenopathy 08/31/2011    Bulky bilat 2009 CT  . Warfarin anticoagulation 08/31/2011  . Hydronephrosis 09/01/2011  . CKD (chronic kidney disease) 09/01/2011  . Abdominal lymphadenopathy 09/01/2011  . Venous insufficiency     His diagnosis this is a note on Roy Blackwell he was diagnosed as having chronic thrombophlebitis when he was just in back in the 50s and has seen numerous surgeons Dr. Nedra Hai Dr. 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 Dr. Genelle Bal he continued his Coumadin management but he has no history of atri     ZOX:WRUEAVW reviewed. No pertinent past surgical history. I have reviewed the FH and SH and  If appropriate update it with new information. No Known Allergies Scheduled Meds: . antiseptic oral rinse  15 mL Mouth Rinse BID  . bicalutamide  50 mg Oral Daily  . cefTRIAXone (ROCEPHIN)  IV  1 g Intravenous Q24H  . feeding supplement  1 Container Oral TID BM  . furosemide      . furosemide  40 mg Intravenous Once  . heparin  5,000 Units Subcutaneous Q8H  . magnesium hydroxide  30 mL Oral Once  . pantoprazole  40 mg Oral Daily  . polyethylene glycol  34 g Oral BID  . sodium chloride  3 mL Intravenous Q12H   Continuous Infusions:  PRN Meds:.acetaminophen, acetaminophen, acetaminophen, alum & mag hydroxide-simeth, bisacodyl, haloperidol lactate, ondansetron (ZOFRAN) IV, ondansetron, sorbitol    BP 131/58  Pulse 96  Temp(Src) 99.2 F (37.3 C) (Oral)  Resp 26  Ht 5\' 6"  (1.676 m)  Wt 59.6 kg (131 lb 6.3 oz)  BMI 21.22 kg/m2  SpO2 94%   PPS:30%             09/29/12 Albumin: 2.0   Intake/Output Summary (Last 24 hours) at 09/30/12 1245 Last data filed at 09/30/12 0900  Gross per 24 hour  Intake   2090 ml  Output    926 ml  Net   1164 ml   LBM:09/29/12                     Physical Exam:  General: Alert, appears comfortable, in  NAD HEENT:  tognue and buccal mucosa moist, no lesions, hard cervical collar on Chest:CTA bilaterally, diminished at bases    CVS:RRR Abdomen:soft, non-tender, BS audible Ext: trace pedal edema bilaterally, ankle ulcers bilaterally dressings intact Neuro:oriented to place, date  Labs: CBC    Component Value Date/Time   WBC 13.5* 09/29/2012 0550   WBC 9.4 08/30/2012 0930   RBC 3.24* 09/29/2012 0550   RBC 1.99* 08/30/2012 0930   HGB 9.6* 09/29/2012 0550   HGB 5.9* 08/30/2012 0930   HCT 28.2* 09/29/2012 0550   HCT 18.8* 08/30/2012 0930   PLT 132* 09/29/2012 0550   PLT 446* 08/30/2012 0930   MCV 87.0 09/29/2012 0550   MCV 94.5 08/30/2012 0930   MCH 29.6 09/29/2012 0550   MCH 29.6 08/30/2012 0930   MCHC 34.0 09/29/2012 0550   MCHC 31.4* 08/30/2012 0930   RDW 21.7* 09/29/2012 0550   RDW 17.4* 08/30/2012 0930   LYMPHSABS 3.1 09/23/2012 1024  LYMPHSABS 2.8 08/30/2012 0930   MONOABS 1.4* 09/23/2012 1024   MONOABS 1.1* 08/30/2012 0930   EOSABS 0.1 09/23/2012 1024   EOSABS 0.1 08/30/2012 0930   BASOSABS 0.1 09/23/2012 1024   BASOSABS 0.0 08/30/2012 0930    BMET    Component Value Date/Time   NA 142 09/30/2012 0540   NA 137 08/30/2012 0930   K 4.0 09/30/2012 0540   K 6.0 Repeated and Verified* 08/30/2012 0930   CL 111 09/30/2012 0540   CL 105 08/30/2012 0930   CO2 16* 09/30/2012 0540   CO2 21* 08/30/2012 0930   GLUCOSE 95 09/30/2012 0540   GLUCOSE 101* 08/30/2012 0930   BUN 21 09/30/2012 0540   BUN 27.8* 08/30/2012 0930   CREATININE 0.86 09/30/2012 0540   CREATININE 1.6* 08/30/2012 0930   CALCIUM 9.0 09/30/2012 0540   CALCIUM 10.4 08/30/2012 0930   GFRNONAA 79* 09/30/2012 0540   GFRAA >90 09/30/2012 0540    CMP     Component Value Date/Time   NA 142 09/30/2012 0540   NA 137 08/30/2012 0930   K 4.0 09/30/2012 0540   K 6.0 Repeated and Verified* 08/30/2012 0930   CL 111 09/30/2012 0540   CL 105 08/30/2012 0930   CO2 16* 09/30/2012 0540   CO2 21* 08/30/2012 0930   GLUCOSE 95 09/30/2012 0540   GLUCOSE 101* 08/30/2012 0930   BUN 21 09/30/2012 0540   BUN 27.8*  08/30/2012 0930   CREATININE 0.86 09/30/2012 0540   CREATININE 1.6* 08/30/2012 0930   CALCIUM 9.0 09/30/2012 0540   CALCIUM 10.4 08/30/2012 0930   PROT 6.1 09/24/2012 0355   PROT 7.0 08/30/2012 0930   ALBUMIN 2.0* 09/30/2012 0540   ALBUMIN 2.5* 08/30/2012 0930   AST 27 09/24/2012 0355   AST 18 08/30/2012 0930   ALT 7 09/24/2012 0355   ALT <6 Repeated and Verified 08/30/2012 0930   ALKPHOS 137* 09/24/2012 0355   ALKPHOS 126 08/30/2012 0930   BILITOT 0.3 09/24/2012 0355   BILITOT 0.26 08/30/2012 0930   GFRNONAA 79* 09/30/2012 0540   GFRAA >90 09/30/2012 0540    Chest Xray Reviewed/Impressions:09/30/12 IMPRESSION:  1. Worsening right basilar airspace opacification, concerning for  worsening pneumonia; more mild left midlung airspace opacities  again seen.  2. Vascular congestion and mild cardiomegaly; interstitial edema  could have a similar appearance, but is considered less likely.  Small right pleural effusion seen.   CT scan of the Head Reviewed/Impressions:09/23/12  IMPRESSION:  1. Findings worrisome for extensive osseous metastatic disease to  the cervical spine, most conspicuously involving the T2 vertebral  body.  2. Comminuted, displaced, likely pathologic fracture involving  primarily the right lateral ring of C1 with associated displacement  and cranial migration of the dens through the foramen magnum. There  is an approximately 0.8 cm displaced osseous fragment adjacent to  the base of the dens within the spinal canal (image 14, series  four).  3. Age indeterminate avulsion fracture involving the spinous  process of the T1 vertebral body.  4. Osseous metastatic disease involving the medial aspect of the  imaged bilateral superior ribs, most conspicuously involving the  posterior medial aspect of the left 2nd rib.  5. Indeterminate approximately 1.3 cm nodule within the imaged  right lung apex.   Time In Time Out Total Time Spent with Patient Total Overall Time  11:30a 12:45p 75 min 80 min     Greater than 50%  of this time was spent counseling and coordinating care related  to the above assessment and plan.  Freddie Breech, CNS-C Palliative Medicine Team [facility] Team Phone: [phone] Pager: [phone]

## 2012-09-30 NOTE — Progress Notes (Addendum)
CSW met with pt / spouse to assist with d/c planning. Pt / spouse are interested in SNF with Palliative care services. SNF search has been initiated and facilities of interest have been contacted. CSW will continue to follow to assist with d/c planning to SNF.  Cori Razor LCSW  Cell # is [phone]

## 2012-09-30 NOTE — Progress Notes (Signed)
Follow-up spoke with wife at bedside who is agreeable to speaking with PMT today at 11:30 am- asked if she would want her granddaughter Bridgett to participate via phone she is attempting to find her number- this RN did attempt to leave message for Bridgett via work number [phone]) provided by [facility] PMT provider to meet with wife Verdell at above noted time-away from patient, initially, at wife's request  Valente David, RN 09/30/2012, 11:41 AM Palliative Medicine Team RN Liaison [phone]

## 2012-10-01 ENCOUNTER — Encounter (HOSPITAL_COMMUNITY): Payer: Medicare Other | Attending: Oncology

## 2012-10-01 ENCOUNTER — Inpatient Hospital Stay (HOSPITAL_COMMUNITY): Payer: Medicare Other

## 2012-10-01 DIAGNOSIS — D539 Nutritional anemia, unspecified: Secondary | ICD-10-CM | POA: Insufficient documentation

## 2012-10-01 LAB — BASIC METABOLIC PANEL
BUN: 23 mg/dL (ref 6–23)
CO2: 20 mEq/L (ref 19–32)
Chloride: 111 mEq/L (ref 96–112)
Glucose, Bld: 98 mg/dL (ref 70–99)
Potassium: 3.7 mEq/L (ref 3.5–5.1)

## 2012-10-01 LAB — CBC
HCT: 25.9 % — ABNORMAL LOW (ref 39.0–52.0)
Hemoglobin: 8.7 g/dL — ABNORMAL LOW (ref 13.0–17.0)
MCH: 29.4 pg (ref 26.0–34.0)
MCHC: 33.6 g/dL (ref 30.0–36.0)

## 2012-10-01 MED ORDER — CIPROFLOXACIN HCL 500 MG PO TABS: 500.0000 mg | ORAL_TABLET | Freq: Two times a day (BID) | ORAL | Status: AC

## 2012-10-01 MED ORDER — ENSURE PUDDING PO PUDG: 1.0000 | Freq: Three times a day (TID) | ORAL | Status: AC

## 2012-10-01 NOTE — Progress Notes (Signed)
NUTRITION FOLLOW UP  Intervention:   1. Continue Ensure Pudding po TID, each supplement provides 170 kcal and 4 grams of protein.    Nutrition Dx:   Inadequate oral intake related to increased needs and decreased swallowing ability as evidenced by weight loss. Ongoing   Goal:   PO intake to meet>/=90% estimated nutrition needs with good tolerance. Unmet   Monitor:   PO intake, weight trends, labs, goals of care   Assessment:   Pt/family decided on palliative care at SNF. Being faxed out for possible d/c tomorrow.  Po intake is variable per meal documentation, 0-75%, mostly about 50%.   Height: Ht Readings from Last 1 Encounters:  09/24/12 5\' 6"  (1.676 m)    Weight Status:   Wt Readings from Last 1 Encounters:  10/01/12 126 lb 15.8 oz (57.6 kg)  weight variable, down from 131 yesterday. Admission weight of 125 lbs.   Re-estimated needs:  Kcal: 1800-2000  Protein: 75-85 gm  Fluid: 1.8-2 L   Skin: stage II pressure ulcer on ankle    Diet Order: Dysphagia 1, thin liquids    Intake/Output Summary (Last 24 hours) at 10/01/12 1057 Last data filed at 10/01/12 0900  Gross per 24 hour  Intake    578 ml  Output   2100 ml  Net  -1522 ml    Last BM: 6/1   Labs:   Recent Labs Lab 09/25/12 0505 09/26/12 0520  09/29/12 0550 09/30/12 0540 10/01/12 0408  NA 136 141  < > 140 142 144  K 4.0 3.0*  < > 3.1* 4.0 3.7  CL 104 110  < > 109 111 111  CO2 20 15*  < > 19 16* 20  BUN 44* 44*  < > 18 21 23   CREATININE 1.37* 1.58*  < > 0.83 0.86 0.93  CALCIUM 13.6* 12.1*  < > 8.5 9.0 9.1  PHOS  --  2.2*  --   --  3.0  --   GLUCOSE 111* 125*  < > 90 95 98  < > = values in this interval not displayed.  CBG (last 3)  No results found for this basename: GLUCAP,  in the last 72 hours  Scheduled Meds: . antiseptic oral rinse  15 mL Mouth Rinse BID  . bicalutamide  50 mg Oral Daily  . cefTRIAXone (ROCEPHIN)  IV  1 g Intravenous Q24H  . feeding supplement  1 Container Oral TID  BM  . heparin  5,000 Units Subcutaneous Q8H  . magnesium hydroxide  30 mL Oral Once  . pantoprazole  40 mg Oral Daily  . polyethylene glycol  34 g Oral BID  . sodium chloride  3 mL Intravenous Q12H     Clarene Duke RD, LDN Pager [phone] After Hours pager [phone]

## 2012-10-01 NOTE — Discharge Summary (Addendum)
Physician Discharge Summary  Roy Blackwell ZOX:[id number] DOB: 1928-08-14 DOA: 09/23/2012  PCP: [name], MD  Admit date: 09/23/2012 Discharge date: 10/01/2012  Time spent: 40 minutes  Recommendations for Outpatient Follow-up:  1. Followup with primary care physician within one week. 2. Recommend to consult palliative care service.  Discharge Diagnoses:  Principal Problem:   Hypercalcemia Active Problems:   Hypertrophic obstructive cardiomyopathy(425.11)   Deep vein thrombosis (DVT)   Chronic venous hypertension due to DVT with ulcer and inflammation   Prostate cancer   Hypertension   Hyperlipidemia   Spine metastasis   Unspecified constipation   Anemia   Acute renal failure (ARF)   CKD stage G3b/A1, GFR 30 - 44 and albumin creatinine ratio <30 mg/g   Malnutrition of moderate degree   Closed C1 fracture   Hyponatremia   UTI (urinary tract infection)   Decreased oral intake   Failure to thrive in adult   Weakness   Palliative care encounter   Other dysphagia   Discharge Condition: Fair  Diet recommendation: Dysphagia I with thin liquids  Filed Weights   09/29/12 0443 09/30/12 0500 10/01/12 0506  Weight: 59.603 kg (131 lb 6.4 oz) 59.6 kg (131 lb 6.3 oz) 57.6 kg (126 lb 15.8 oz)    History of present illness:  Roy Blackwell is a 79 y.o. male with stage IV prostate cancer, metastatic to the bones, abdomen and pelvis, severe anemia, history of deep vein thromboses previously on Coumadin, was brought to the hospital by family after a fall at home. He has been losing weight for the past months, getting progressively weaker, obtunded, unable to eat or drink. In emergency room he was found to have severe anemia, hypercalcemia and a C-spine pathological fracture. Dr. Preston Fleeting from the emergency room has spoken with the neurosurgeon on call Dr. Tressie Stalker, and the patient will be placed on a hard cervical collar without plans for emergent neurosurgery.   Hospital Course:    1. Hypercalcemia - secondary to stage IV prostate cancer  This is secondary to the bone metastasis and dehydration. Corrected calcium level is 16.3 on 09/24/2012, today corrected calcium level is 11.9  On IV normal saline from the day of admission. He was started on subcutaneous calcitonin, DC because of tachyphylaxis  Pamidronate 60 mg IV x1 given on 09/25/2022.  This is resolved prior to discharge.  2. Metastatic prostate cancer.  PSA >1460 (08/31/2011) -> 623 (11/15/2011) -> 348 (03/18/2012) -> 587 (08/30/2012).  Followed by Dr. Mena Goes - On Firmagon/Casodex. On Xgeva for bone disease.  CT scan 08/31/2011 shows bulky intrathoracic and intraabdominal adenopathy.  He also has a pathologic fx of L4. He had a IR bone tumor ablation 10/17/2011 by Dr. Corliss Skains.  Saw Dr. Clelia Croft (08/30/2012) - for metastatic prostate ca. upon admission to [facility] May 27 a CT scan of the cervical spine indicates numerous blastic metastatic deposits.   3. UTI  Started on Rocephin, urine culture showed CITROBACTER FREUNDII  4. Bilateral chronic venous stasis ulcers  Medially above his ankles. L>R  He grew out Pseudomonas on cultures in May 2012 and has been on Septra since then from [facility].  For now we did stop antibiotics until clinical status stabilizes. Wound care consult in the hospital was obtained   5. History of recurrent DVTs.  The patient was On coumadin until his anemia has become too severe for treatment to be safe.  No major bleeding noted, discharged on Coumadin. Followup INR closely.  6. Hypertrophic  cardiomyopathy  Monitor volume status closely  Chest x-ray showed some congestion, IV fluids discontinued and started on Lasix 09/30/12   7. Constipation  Treatment with Senokot, MiraLAX, Colace , secondary to the hypercalcemia.   8. Severe protein caloric malnutrition  Weight down about 20 pounds over the last 3 months  He weighed 145 in February, 2014. Weight recorded on admission is 125  pounds  Ensure 3 times a day prescribed on admission.   9. Anemia  Hgb was 5.2 on 08/30/2012 and patient did received one unit of packed red blood cells in the [facility].  Patient was transfused 2 units of packed RBCs on 09/23/2012.  No evidence of obvious bleeding, hemoglobin dropped again to 7.7. I will hold on the transfusion   10. C1 cervical spine fracture post fall - Patient was placed in hard cervical collar on admission at the recommendation of Dr. Tressie Stalker for neurosurgery, on call on May 26. Consultation for radiation therapy to be considered if oncology feels worthwhile trying. On the morning of May 27 the patient was found with his cervical collar off after he took it off himself. Save considerable placed at the bedside so the patient cannot remove his cervical collar again.   11. Hyponatremia presented with sodium level of 129, this is likely secondary to dehydration and volume depletion, after hydration with IV fluids hyponatremia resolved.  12. Acute on chronic renal failure-presented with creatinine of 1.93, after hydration with IV fluids creatinine went down to normal 0.8.  13. Toxic metabolic encephalopathy This is likely secondary to the acute illness and hypercalcemia, this is resolved. Patient is back to his baseline.   14. Advanced directives: Because of patient's advanced prostate cancer, I did speak with his oncologist who recommended also palliative/hospice care consultation. Palliative medicine team was consulted during this hospital stay, patient is to be DNR/DNI, family wants palliative medicine to followup with her in the nursing home.   Procedures:  Indwelling Foley catheter change.  Consultations:  Hematology/oncology.  Palliative and hospice medicine  Discharge Exam: Filed Vitals:   09/30/12 1300 09/30/12 2141 10/01/12 0506 10/01/12 0509  BP: 128/58 114/57  143/57  Pulse: 76 95  85  Temp: 98.6 F (37 C) 98.8 F (37.1 C)  98.4  F (36.9 C)  TempSrc: Oral Oral  Oral  Resp: 18 24  22   Height:      Weight:   57.6 kg (126 lb 15.8 oz)   SpO2: 96% 95%  94%   General: Awake, confused. HEENT: anicteric sclera, pupils reactive to light and accommodation, EOMI CVS: S1-S2 clear, no murmur rubs or gallops Chest: clear to auscultation bilaterally, no wheezing, rales or rhonchi Abdomen: soft nontender, nondistended, normal bowel sounds, no organomegaly Extremities: no cyanosis, clubbing or edema noted bilaterally Neuro: Cranial nerves II-XII intact, no focal neurological deficits  Discharge Instructions  Discharge Orders   Future Appointments Provider Department Dept Phone   10/04/2012 11:15 AM Dava Najjar Idelle [facility] [phone]   10/04/2012 11:45 AM Myrtis Ser, NP [facility] [phone]   10/04/2012 12:45 PM [facility] [phone]   Future Orders Complete By Expires     Diet - low sodium heart healthy  As directed     Increase activity slowly  As directed         Medication List    STOP taking these medications  sulfamethoxazole-trimethoprim 800-160 MG per tablet  Commonly known as:  BACTRIM DS,SEPTRA DS      TAKE these medications       acetaminophen 500 MG tablet  Commonly known as:  TYLENOL  Take 500 mg by mouth every 8 (eight) hours as needed for pain. For pain.     bicalutamide 50 MG tablet  Commonly known as:  CASODEX  Take 50 mg by mouth daily.     ciprofloxacin 500 MG tablet  Commonly known as:  CIPRO  Take 1 tablet (500 mg total) by mouth 2 (two) times daily.     feeding supplement Pudg  Take 1 Container by mouth 3 (three) times daily between meals.     VITAMIN C PO  Take by mouth daily.     VITAMIN D PO  Take by mouth daily.     warfarin 5 MG tablet  Commonly known as:  COUMADIN  Take as directed by anticoagulation clinic       No Known Allergies      Follow-up Information   Follow up with [name], MD In 1 week.   Contact information:   [address] [place] [address] [phone]        The results of significant diagnostics from this hospitalization (including imaging, microbiology, ancillary and laboratory) are listed below for reference.    Significant Diagnostic Studies: Ct Head Wo Contrast  09/23/2012   *RADIOLOGY REPORT*  Clinical Data: Several falls of the last several weeks with confusion, history of prostate cancer  CT HEAD WITHOUT CONTRAST CT CERVICAL SPINE WITHOUT CONTRAST  Technique:  Multidetector CT imaging of the head and cervical spine was performed following the standard protocol without intravenous contrast.  Multiplanar CT image reconstructions of the cervical spine were also generated.  Comparison:  Head CT - 09/16/2006; chest CT - 09/16/2006  CT HEAD  Findings:  The examination is degraded secondary to patient motion artifact necessitating additional images.  In particular, there is suboptimal evaluation of the base of the skull.  Grossly unchanged findings of mild diffuse atrophy with stable diffuse sulcal prominence.  Grossly unchanged mild centralized volume loss with corresponding ex vacuo dilatation of the ventricular system.  Scattered periventricular hypodensities compatible with microvascular ischemic disease.  Old lacunar infarcts within the bilateral basal ganglia.  Given the degradation of the examination and extensive background parenchymal abnormalities, there is no CT evidence of acute large territory infarct.  No definite intraparenchymal or extra-axial mass or hemorrhage.  Unchanged size configuration of the ventricles and basilar cisterns.  No midline shift.  Minimal mucosal thickening within the left sphenoid sinus.  The remaining paranasal sinuses and mastoid air cells are normally aerated.  Regional soft tissues are normal.  No definite displaced calvarial fracture.  IMPRESSION: 1.   Degraded examination without definite acute intracranial process.  If clinical concern persists, further evaluation with repeat head CT when the patient better able to tolerate the examination is recommended. 2. Grossly stable findings of advanced atrophy and microvascular ischemic disease.  CT CERVICAL SPINE  Findings:  C1 to the superior endplate of T2 is imaged.  There is increased mottled sclerosis most conspicuous involving the T2 vertebral body with additional ill-defined areas of sclerosis within the left lateral aspect of the C7 and C9 T1 vertebral bodies, worrisome for osseous metastatic disease.  There is mottled sclerosis involving the posterior medial aspect of several of the imaged superior ribs, most conspicuously involving the posterior aspect of the left  sided second rib.  There is a comminuted displaced likely pathologic fracture involving the right lateral aspect of the arch of C1 with associated posterior lateral displacement of the right side of the arch of C1 and left lateral deviation of the left side of the arch of C1.  These findings are associated with cranial migration of the dens through the foramen magnum.  There is an approximate 0.8 x 0.6 cm displaced osseous fragment seen adjacent to the base of the dens within the spinal canal (image 14, series four).  There is an age indeterminate avulsion fracture involving the spinous process of the T1 vertebral body.  Calcifications within the bilateral carotid bulbs.  Limited visualization of the lung eighty suggests a right apical approximately 1.3 x 0.9 cm nodule (image 53, series five).  IMPRESSION: 1.  Findings worrisome for extensive osseous metastatic disease to the cervical spine, most conspicuously involving the T2 vertebral body. 2.   Comminuted, displaced, likely pathologic fracture involving primarily the right lateral ring of C1 with associated displacement and cranial migration of the dens through the foramen magnum. There is an  approximately 0.8 cm displaced osseous fragment adjacent to the base of the dens within the spinal canal (image 14, series four).  3.  Age indeterminate avulsion fracture involving the spinous process of the T1 vertebral body.  4.  Osseous metastatic disease involving the medial aspect of the imaged bilateral superior ribs, most conspicuously involving the posterior medial aspect of the left 2nd rib.  5.  Indeterminate approximately 1.3 cm nodule within the imaged right lung apex.  Above findings discussed with Dr. Preston Fleeting at 13 28.   Original Report Authenticated By: Tacey Ruiz, MD   Ct Cervical Spine Wo Contrast  09/23/2012   *RADIOLOGY REPORT*  Clinical Data: Several falls of the last several weeks with confusion, history of prostate cancer  CT HEAD WITHOUT CONTRAST CT CERVICAL SPINE WITHOUT CONTRAST  Technique:  Multidetector CT imaging of the head and cervical spine was performed following the standard protocol without intravenous contrast.  Multiplanar CT image reconstructions of the cervical spine were also generated.  Comparison:  Head CT - 09/16/2006; chest CT - 09/16/2006  CT HEAD  Findings:  The examination is degraded secondary to patient motion artifact necessitating additional images.  In particular, there is suboptimal evaluation of the base of the skull.  Grossly unchanged findings of mild diffuse atrophy with stable diffuse sulcal prominence.  Grossly unchanged mild centralized volume loss with corresponding ex vacuo dilatation of the ventricular system.  Scattered periventricular hypodensities compatible with microvascular ischemic disease.  Old lacunar infarcts within the bilateral basal ganglia.  Given the degradation of the examination and extensive background parenchymal abnormalities, there is no CT evidence of acute large territory infarct.  No definite intraparenchymal or extra-axial mass or hemorrhage.  Unchanged size configuration of the ventricles and basilar cisterns.  No midline  shift.  Minimal mucosal thickening within the left sphenoid sinus.  The remaining paranasal sinuses and mastoid air cells are normally aerated.  Regional soft tissues are normal.  No definite displaced calvarial fracture.  IMPRESSION: 1.  Degraded examination without definite acute intracranial process.  If clinical concern persists, further evaluation with repeat head CT when the patient better able to tolerate the examination is recommended. 2. Grossly stable findings of advanced atrophy and microvascular ischemic disease.  CT CERVICAL SPINE  Findings:  C1 to the superior endplate of T2 is imaged.  There is increased mottled sclerosis most conspicuous involving  the T2 vertebral body with additional ill-defined areas of sclerosis within the left lateral aspect of the C7 and C9 T1 vertebral bodies, worrisome for osseous metastatic disease.  There is mottled sclerosis involving the posterior medial aspect of several of the imaged superior ribs, most conspicuously involving the posterior aspect of the left sided second rib.  There is a comminuted displaced likely pathologic fracture involving the right lateral aspect of the arch of C1 with associated posterior lateral displacement of the right side of the arch of C1 and left lateral deviation of the left side of the arch of C1.  These findings are associated with cranial migration of the dens through the foramen magnum.  There is an approximate 0.8 x 0.6 cm displaced osseous fragment seen adjacent to the base of the dens within the spinal canal (image 14, series four).  There is an age indeterminate avulsion fracture involving the spinous process of the T1 vertebral body.  Calcifications within the bilateral carotid bulbs.  Limited visualization of the lung eighty suggests a right apical approximately 1.3 x 0.9 cm nodule (image 53, series five).  IMPRESSION: 1.  Findings worrisome for extensive osseous metastatic disease to the cervical spine, most conspicuously  involving the T2 vertebral body. 2.   Comminuted, displaced, likely pathologic fracture involving primarily the right lateral ring of C1 with associated displacement and cranial migration of the dens through the foramen magnum. There is an approximately 0.8 cm displaced osseous fragment adjacent to the base of the dens within the spinal canal (image 14, series four).  3.  Age indeterminate avulsion fracture involving the spinous process of the T1 vertebral body.  4.  Osseous metastatic disease involving the medial aspect of the imaged bilateral superior ribs, most conspicuously involving the posterior medial aspect of the left 2nd rib.  5.  Indeterminate approximately 1.3 cm nodule within the imaged right lung apex.  Above findings discussed with Dr. Preston Fleeting at 13 28.   Original Report Authenticated By: Tacey Ruiz, MD   Dg Chest Port 1 View  10/01/2012   *RADIOLOGY REPORT*  Clinical Data: Shortness of breath and pulmonary edema.  PORTABLE CHEST - 1 VIEW  Comparison: 09/30/2012  Findings: Patchy densities in the right lower lung persist.  There may be a small right pleural effusion.  Left lung is unchanged with mild interstitial prominence.  Stable appearance of the heart and mediastinum.  IMPRESSION: Persistent airspace densities in the right lower lung which could represent pneumonia or asymmetric edema.  Suspect right pleural fluid.  Minimal change from the previous examination.   Original Report Authenticated By: Richarda Overlie, M.D.   Dg Chest Port 1 View  09/30/2012   *RADIOLOGY REPORT*  Clinical Data: Wheezing and shortness of breath.  PORTABLE CHEST - 1 VIEW  Comparison: Chest radiograph performed 09/23/2012  Findings: The lungs are well-aerated.  Worsening right basilar airspace opacification is concerning for worsening pneumonia.  More mild left midlung airspace opacities again seen.  Vascular congestion is noted; interstitial edema could have a similar appearance, but is considered less likely.  A small  right pleural effusion is seen.  No pneumothorax is identified.  The cardiomediastinal silhouette is mildly enlarged.  No acute osseous abnormalities are seen.  IMPRESSION:  1.  Worsening right basilar airspace opacification, concerning for worsening pneumonia; more mild left midlung airspace opacities again seen. 2.  Vascular congestion and mild cardiomegaly; interstitial edema could have a similar appearance, but is considered less likely. Small right pleural effusion seen.  Original Report Authenticated By: Tonia Ghent, M.D.   Dg Chest Portable 1 View  09/23/2012   *RADIOLOGY REPORT*  Clinical Data: Weakness.  History of hypertension.  PORTABLE CHEST - 1 VIEW  Comparison: 09/16/2006  Findings: Artifact overlies the chest.  The heart is enlarged. There is venous hypertension.  There may be early interstitial edema.  There is volume loss in the lower lobes, left more than right.  Basilar pneumonia is not excluded.  There appear to be old healed rib fractures.  There has been previous vertebral augmentation in the lower thoracic region. Sclerotic foci are seen within the skeleton consistent with progression of blastic metastatic disease (prostate cancer).  IMPRESSION: Possible fluid overload/early interstitial edema.  Density in the lower lobes left worse than right that could be atelectasis or pneumonia.  Sclerotic foci evident in the skeleton progressive since the previous studies consistent with blastic metastases related to prostate cancer.   Original Report Authenticated By: Paulina Fusi, M.D.    Microbiology: Recent Results (from the past 240 hour(s))  URINE CULTURE     Status: None   Collection Time    09/23/12 11:10 AM      Result Value Range Status   Specimen Description URINE, RANDOM   Final   Special Requests NONE   Final   Culture  Setup Time 09/23/2012 20:28   Final   Colony Count NO GROWTH   Final   Culture NO GROWTH   Final   Report Status 09/24/2012 FINAL   Final  URINE CULTURE      Status: None   Collection Time    09/27/12  9:38 AM      Result Value Range Status   Specimen Description URINE, CLEAN CATCH   Final   Special Requests NONE   Final   Culture  Setup Time 09/27/2012 10:17   Final   Colony Count >=100,000 COLONIES/ML   Final   Culture CITROBACTER FREUNDII   Final   Report Status 09/29/2012 FINAL   Final   Organism ID, Bacteria CITROBACTER FREUNDII   Final     Labs: Basic Metabolic Panel:  Recent Labs Lab 09/25/12 0505 09/26/12 0520 09/27/12 0450 09/28/12 1000 09/29/12 0550 09/30/12 0540 10/01/12 0408  NA 136 141 145 144 140 142 144  K 4.0 3.0* 3.5 3.6 3.1* 4.0 3.7  CL 104 110 115* 112 109 111 111  CO2 20 15* 18* 19 19 16* 20  GLUCOSE 111* 125* 87 100* 90 95 98  BUN 44* 44* 30* 22 18 21 23   CREATININE 1.37* 1.58* 1.08 0.91 0.83 0.86 0.93  CALCIUM 13.6* 12.1* 10.2 8.9 8.5 9.0 9.1  PHOS  --  2.2*  --   --   --  3.0  --    Liver Function Tests:  Recent Labs Lab 09/26/12 0520 09/30/12 0540  ALBUMIN 1.8* 2.0*   No results found for this basename: LIPASE, AMYLASE,  in the last 168 hours No results found for this basename: AMMONIA,  in the last 168 hours CBC:  Recent Labs Lab 09/26/12 0520 09/27/12 0450 09/28/12 1000 09/29/12 0550 10/01/12 0408  WBC 12.9* 12.6* 13.5* 13.5* 10.7*  HGB 7.7* 6.8* 9.5* 9.6* 8.7*  HCT 23.0* 20.0* 27.2* 28.2* 25.9*  MCV 86.8 86.2 84.7 87.0 87.5  PLT 225 199 158 132* 75*   Cardiac Enzymes: No results found for this basename: CKTOTAL, CKMB, CKMBINDEX, TROPONINI,  in the last 168 hours BNP: BNP (last 3 results) No results found for this basename: PROBNP,  in the last 8760 hours CBG:  Recent Labs Lab 09/25/12 2136  GLUCAP 112*       Signed:  Maribel Hadley A  [facility] 10/01/2012, 11:52 AM

## 2012-10-01 NOTE — Progress Notes (Signed)
Pt. discharged to floorwife,verbalized understanding of discharged instruction,medication,restriction,diet and follow up appointment.Baseline Vitals sign stable,Pt comfortable,no sign and symptom of distress.

## 2012-10-01 NOTE — Progress Notes (Signed)
Patient WU:[id number] R Digeronimo      DOB: 1928-10-19      YNW:[id number]   Palliative Medicine Team at [facility] Progress Note    Subjective:Patient alert,pleasant but clearly disoriented this morning. Cervical hard collar on, denies any pain in neck or elsewhere.    Filed Vitals:   10/01/12 0509  BP: 143/57  Pulse: 85  Temp: 98.4 F (36.9 C)  Resp: 22   Physical exam: General: confused CHEST: CTA bilaterally, diminished at bases CVS: RRR ABD: soft, non-tender, BS audible GU: indwelling foley d/w medium amber urine NEURO: disoriented   Assessment and plan:Patient is an 79 yo AAM, with PMH: Prostate Cancer (diagnosed 1992), now with advanced disease to bones, s/p vertebroplasty 09/2011, also has history of DVT, was on Coumadin, which was stopped due to severe anemia. Admitted 09/23/12 s/p fall at home, found to have hypercalcemia. Wife primary decision maker, opting at this time to continue current level limited medical interventions  Code Status: DNR/DNI  1. Anxiety/Agitation:Haldol 1 mg IV every 6 hours as needed 2. Pain:Tylenol 650 mg every 8 hours as needed 3. Dysphagia: due to hard cervical collar-on modified Dysphagia Diet 4. Bowel Regimen:Miralax oral twice daily 5. Disposition: recommend discharge back to SNF with Palliative care services to follow  Time In Time Out Total Time Spent with Patient Total Overall Time  12 noon 12:15p 15 min 15 min   Freddie Breech, CNS-C Palliative Medicine Team [facility] Team Phone: [phone] Pager: [phone]

## 2012-10-01 NOTE — Progress Notes (Signed)
Pt will be d/c to [facility] today via [facility] transport. Pt / spouse are aware and in agreement with d/c plan. Transport has been arranged for 4pm.  Cori Razor LCSW [phone]

## 2012-10-04 ENCOUNTER — Other Ambulatory Visit: Payer: Medicare Other | Admitting: Lab

## 2012-10-04 ENCOUNTER — Ambulatory Visit: Payer: Medicare Other | Admitting: Oncology

## 2012-11-05 ENCOUNTER — Ambulatory Visit: Payer: Self-pay | Admitting: Cardiology

## 2012-11-29 DEATH — deceased

## 2012-12-25 IMAGING — XA IR KYPHOPLASTY LUMBAR INIT
1 series · 13 of 24 positions shown · non-contrast
Comparison: MRI scan of the thoracolumbar spine of 09/20/2011.

CLINICAL DATA: Severe lower thoracic and lower lumbar pain
secondary to pathologic compressions at T10 and at L4.  History of
prostate cancer.

TUMOR ABLATION AT T10 AND AT L4 FOLLOWED BY VERTEBRAL BODY
AUGMENTATION FOR SEVERE PAIN

[Series 300: spine · 13 of 38 slices shown]
[im 1/38]
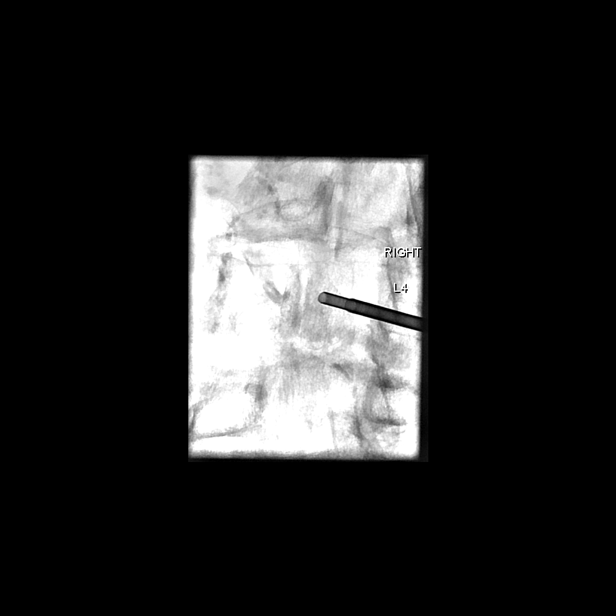
[im 4/38]
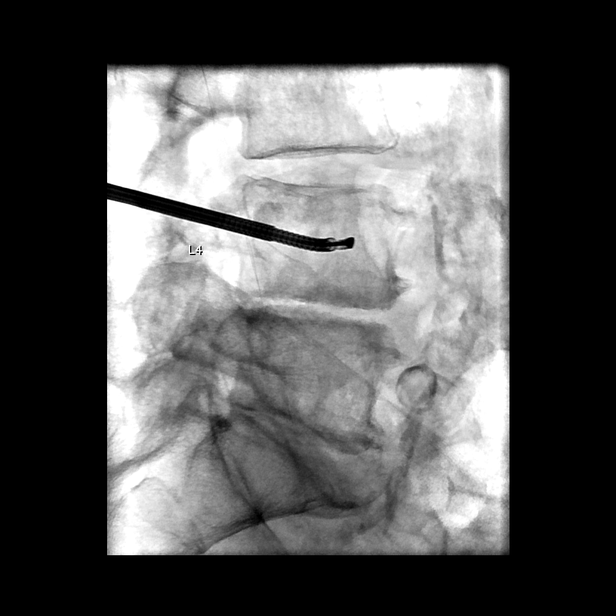
[im 7/38]
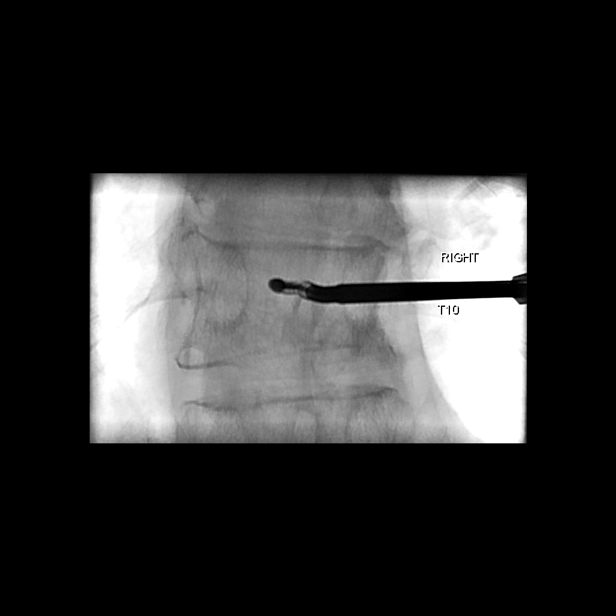
[im 10/38]
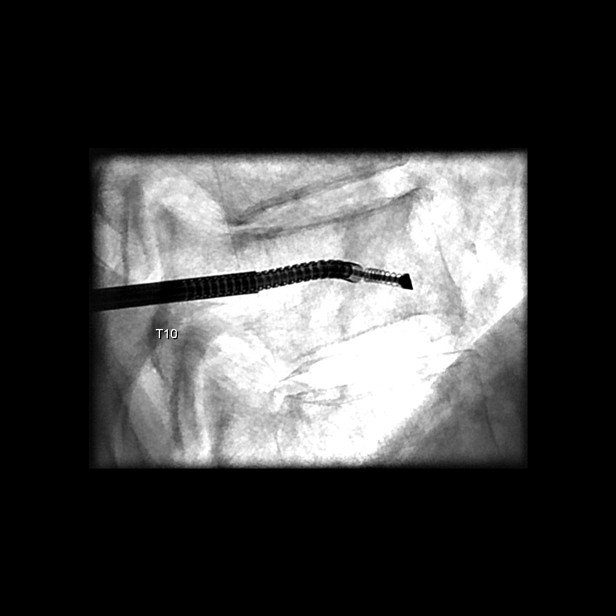
[im 13/38]
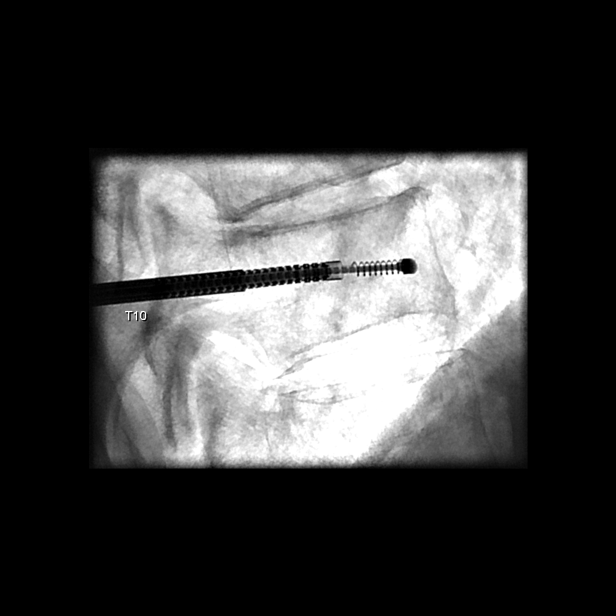
[im 17/38]
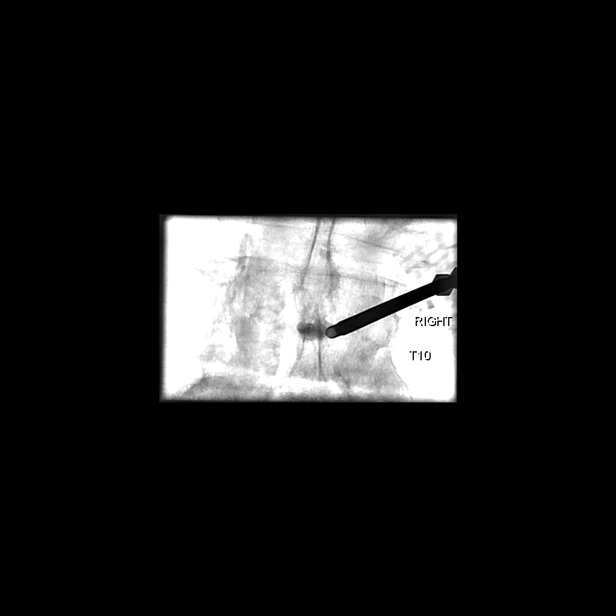
[im 20/38]
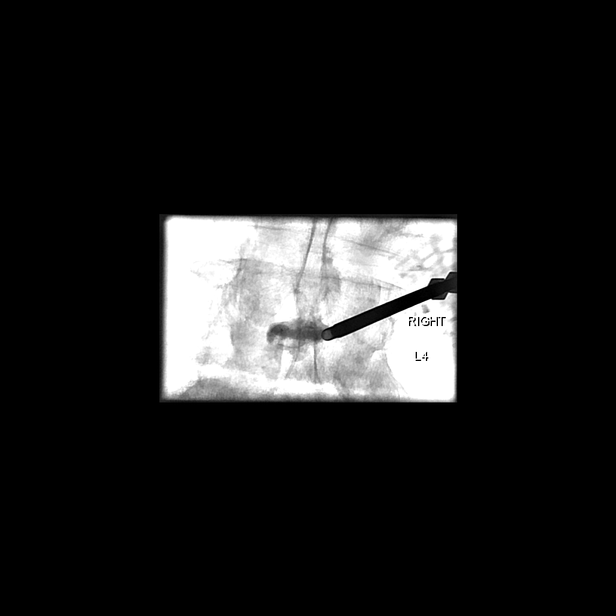
[im 21/38]
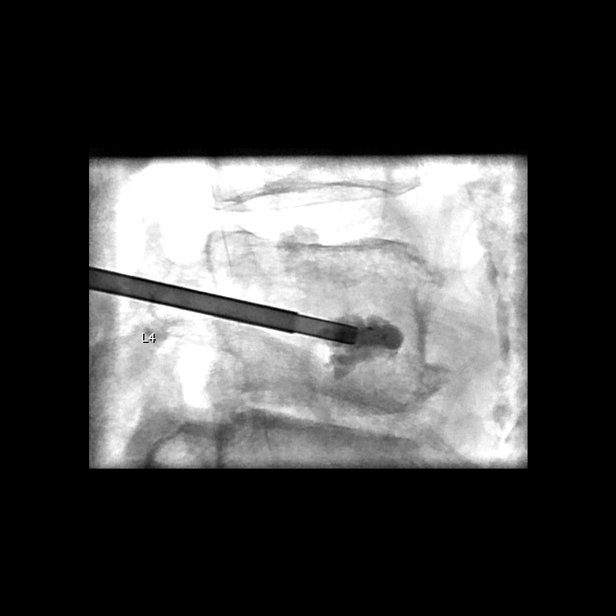
[im 25/38]
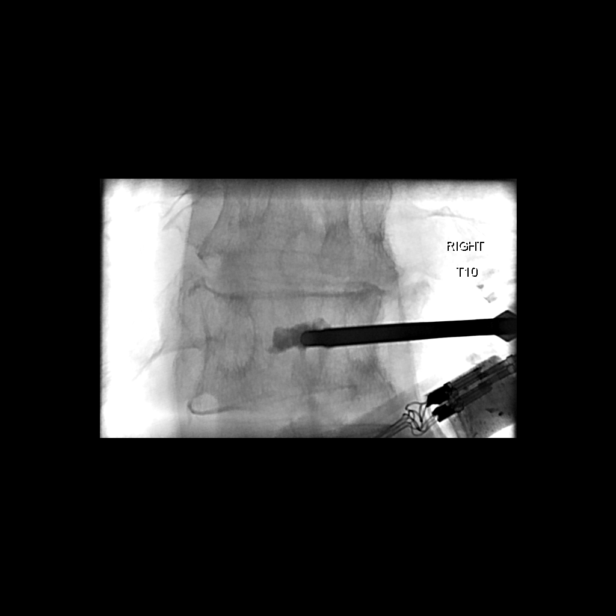
[im 28/38]
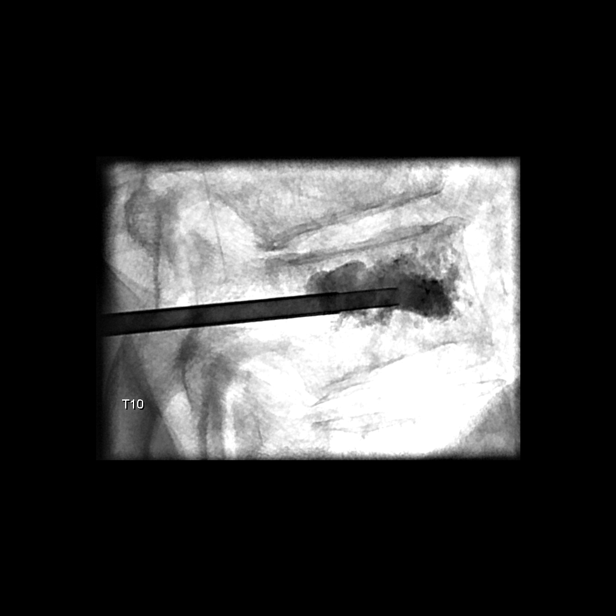
[im 31/38]
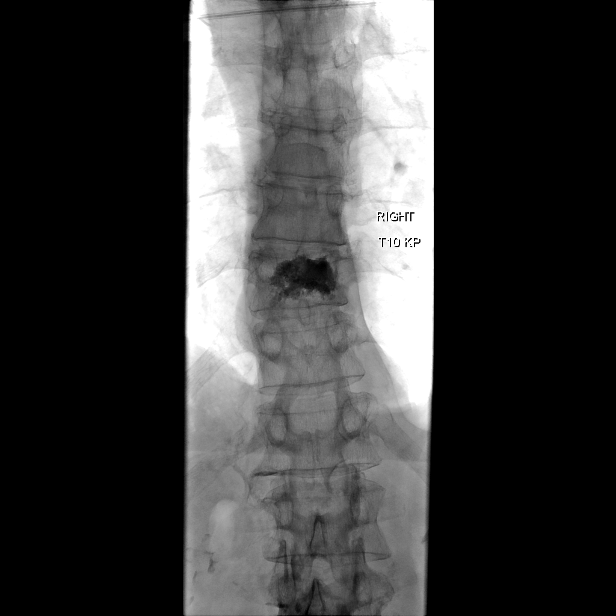
[im 34/38]
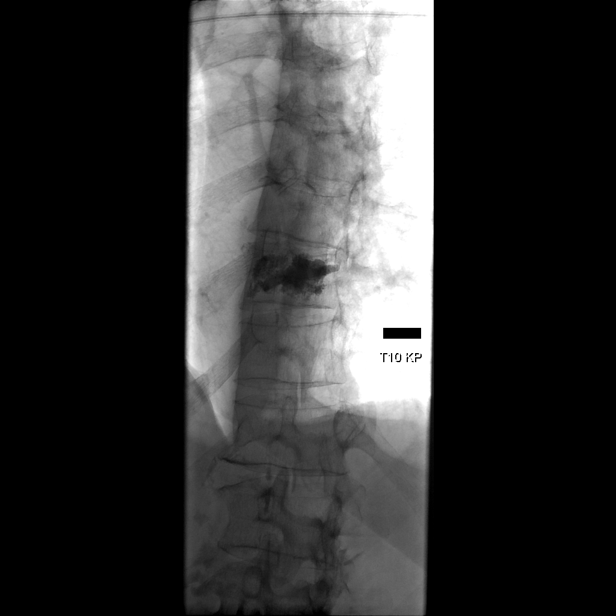
[im 38/38]
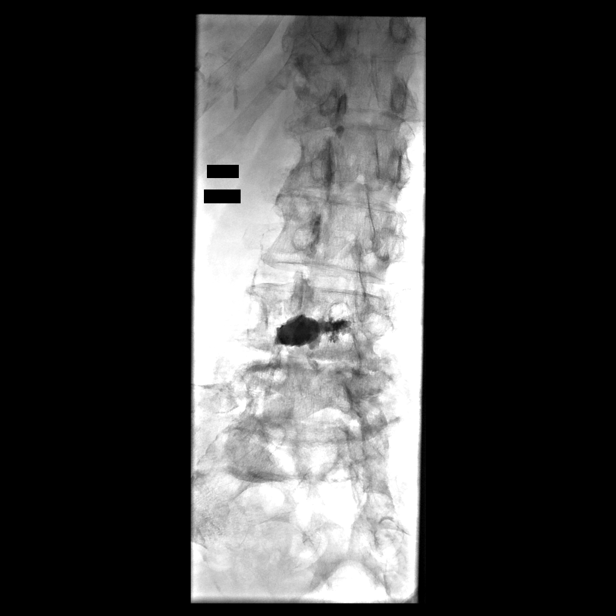

[13 of 24 positions shown; findings below may reference images not displayed]

Following a full explanation of the procedure along with the
potential associated complications, an informed witnessed consent
was obtained.

The patient was placed prone on the fluoroscopic table.  The skin
overlying the thoracolumbar region was then prepped and draped in
the usual sterile fashion.

The right pedicle at T10 and the right pedicle at L4 were then
infiltrated with a 0.25% bupivacaine.

Using biplane intermittent fluoroscopy, 10.5-gauge DFine spine
needles were advanced into the posterior third at T10 and at L4.

Through these, DFine bone biopsy instrumentation was advanced
distal to the working needles.  Using a 20 ml syringe, samples were
obtained for analysis and sent in separate vials for pathologic
analysis.

Through Ieda Maria Kozak, the DFine ablation device was advanced and
manipulated at each of the levels separately.

At L4, ablation was continued to temperatures of 50 degrees
centigrade at the distal end in the ipsilateral neutral position
for 5 minutes, and the ipsilateral superior position for 2 minutes.

At T10, ablations were performed in the neutral contralateral
position for 1 minute, and the contralateral inferior position for
40 seconds, in the ipsilateral neutral position for 1 minute and 10
seconds, and in the ipsilateral superior position for 30 seconds.
The total time for ablations at both levels was 10.2 minutes.

Temperature achieved at the distal ends of the ablation
instrumentation was 50 degrees centigrade.

This was then removed.

The needles at both levels were then advanced to the junction of
the anterior and middle thirds.

At this time methylmethacrylate mixture was reconstituted with
Tobramycin in the DFine mixing system.  This was then loaded onto a
DFine injector device.  This was locked in position at the hubs of
the working needle at T10 and at L4.  Using biplane intermittent
fluoroscopy, methylmethacrylate mixture was injected at both
levels.  There was excellent filling obtained at both T10 and at
L4.

No extrusion was seen at either of the levels.  There was no
extension into the spinal canal or into the paraspinous venous
structures.

The working needles were then removed.  Hemostasis was achieved at
the skin entry sites.

The patient tolerated the procedure well.  There were no acute
complications.

Medications utilized: Versed 6 mg IV.  Fentanyl 200 mcg IV.
Dilaudid 3 mg IV.
IMPRESSION: 1. Status post fluoroscopic-guided needle placement for deep core
bone biopsy at T10 and L4.
2.  Status post ablation of tumor at T10 and at L4 followed by
vertebral body augmentation for painful pathological compression
deformities.

## 2013-02-14 NOTE — Progress Notes (Signed)
error 

## 2013-12-02 IMAGING — CR DG CHEST 1V PORT
1 series · 1 of 1 positions shown · non-contrast
Comparison: 09/16/2006

CLINICAL DATA: Weakness.  History of hypertension.

PORTABLE CHEST - 1 VIEW

[AP]
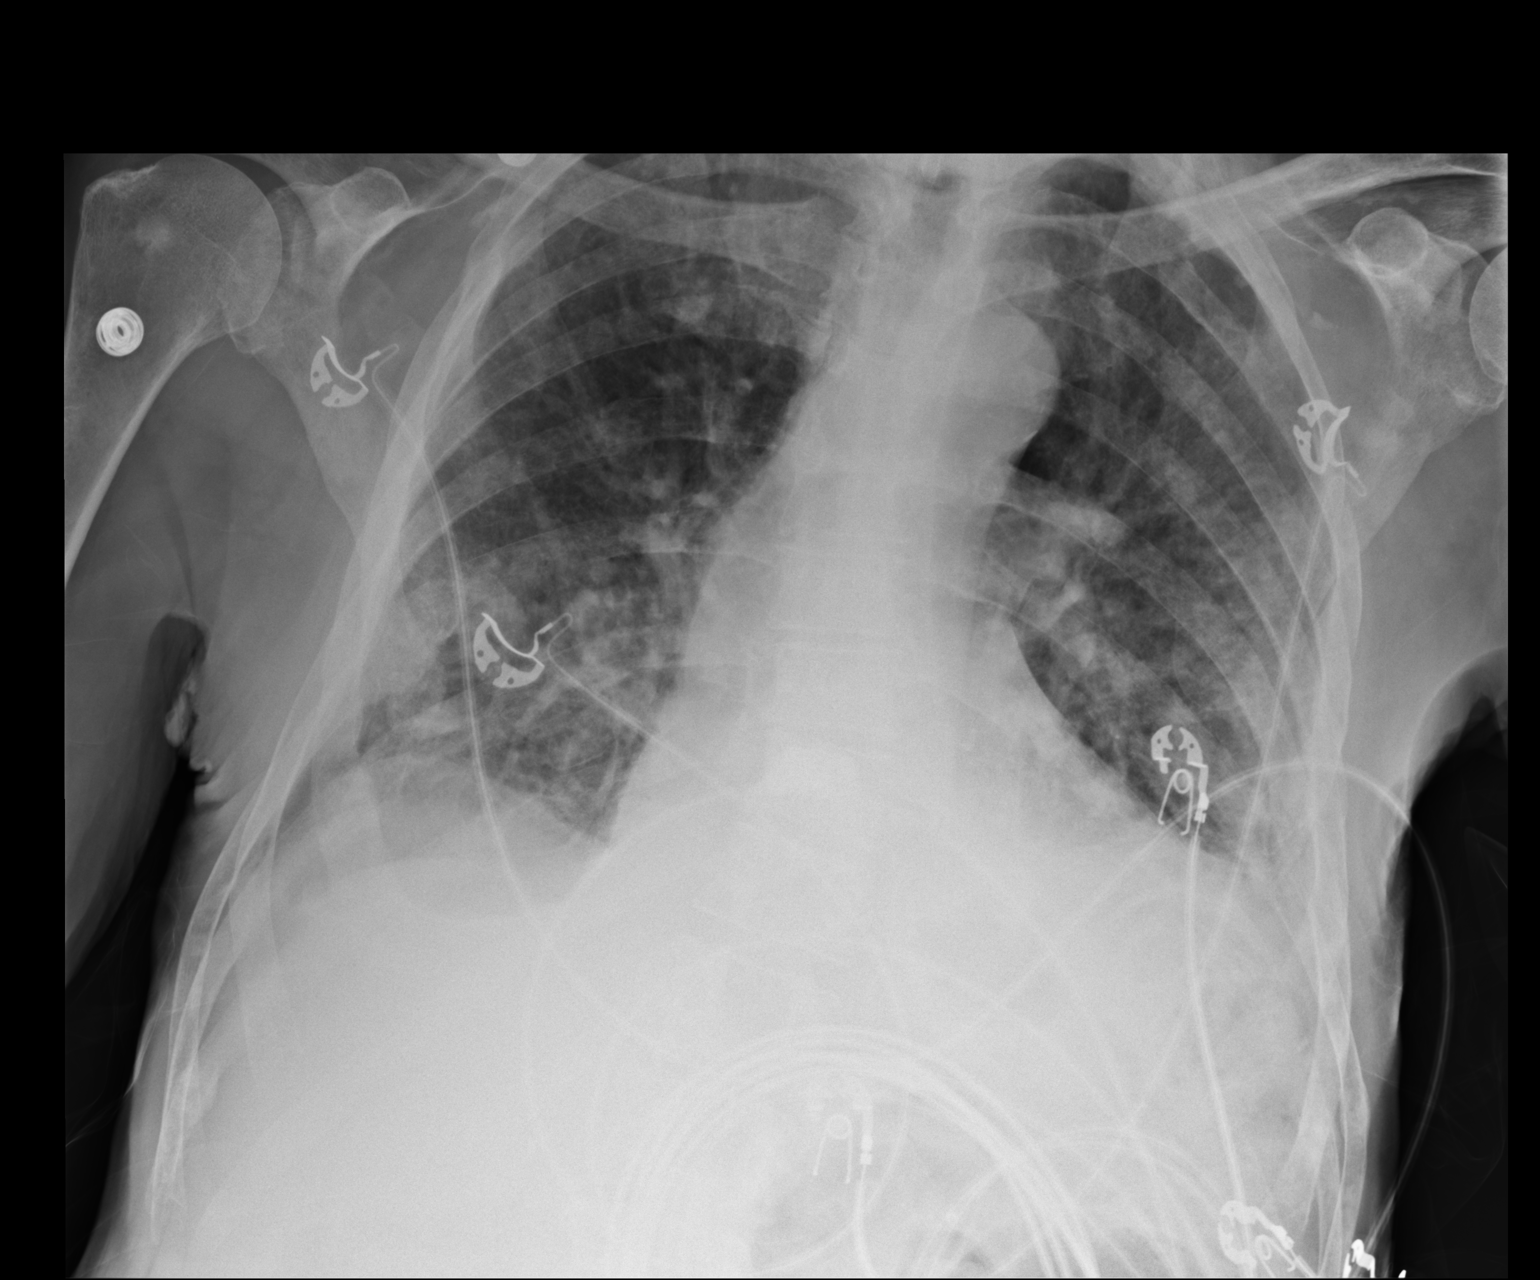

[1 of 1 positions shown; findings below may reference images not displayed]

FINDINGS: Artifact overlies the chest.  The heart is enlarged.
There is venous hypertension.  There may be early interstitial
edema.  There is volume loss in the lower lobes, left more than
right.  Basilar pneumonia is not excluded.  There appear to be old
healed rib fractures.  There has been previous vertebral
augmentation in the lower thoracic region. Sclerotic foci are seen
within the skeleton consistent with progression of blastic
metastatic disease (prostate cancer).
IMPRESSION: Possible fluid overload/early interstitial edema.

Density in the lower lobes left worse than right that could be
atelectasis or pneumonia.

Sclerotic foci evident in the skeleton progressive since the
previous studies consistent with blastic metastases related to
prostate cancer.

## 2013-12-02 IMAGING — CT CT HEAD W/O CM
1 of 2 series · 13 of 30 positions shown, 17 images · non-contrast
Comparison: Head CT - 09/16/2006; chest CT - 09/16/2006

CT HEAD

CLINICAL DATA: Several falls of the last several weeks with
confusion, history of prostate cancer

CT HEAD WITHOUT CONTRAST
CT CERVICAL SPINE WITHOUT CONTRAST
TECHNIQUE: Multidetector CT imaging of the head and cervical spine
was performed following the standard protocol without intravenous
contrast.  Multiplanar CT image reconstructions of the cervical
spine were also generated.

[Series 3: recon 2: brain · axial · 0.47mm/px · z∈[-143,-8]mm · 13 of 96 slices shown, 17 images]
[im 6/96  brain]
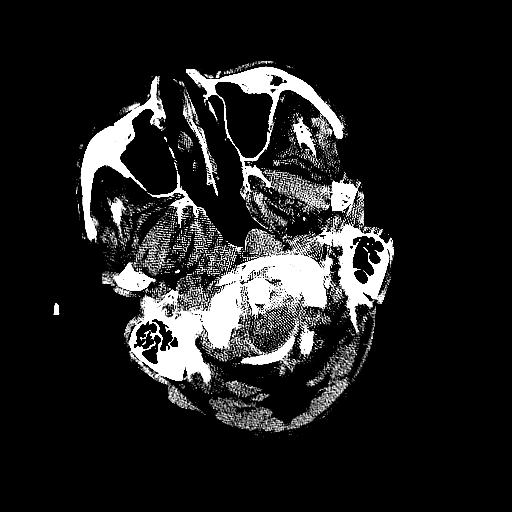
[im 6/96  bone]
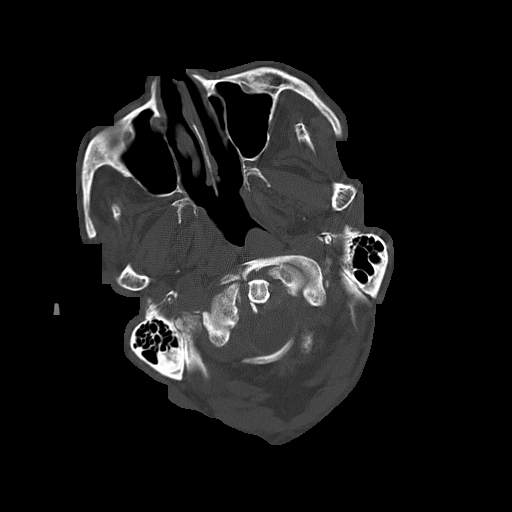
[im 16/96  brain]
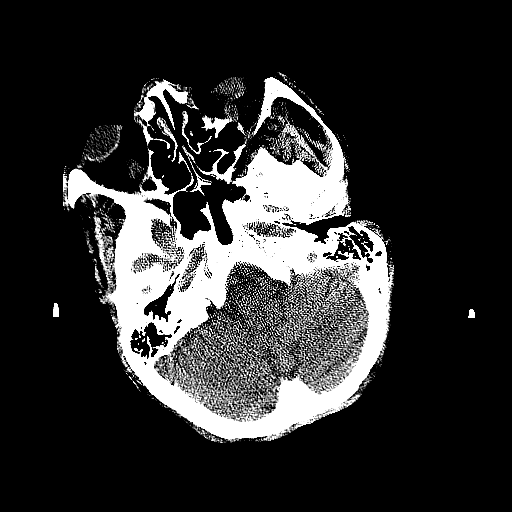
[im 21/96  brain]
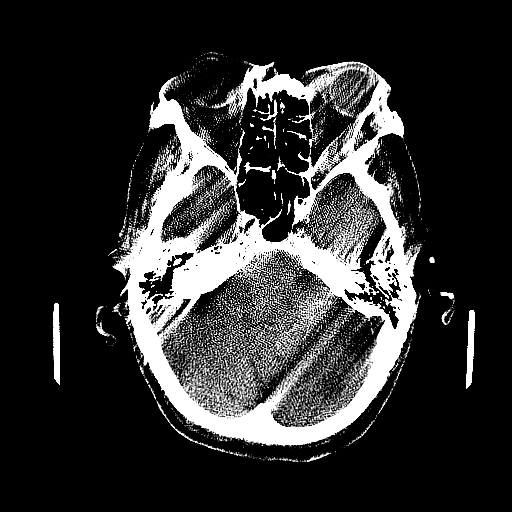
[im 26/96  brain]
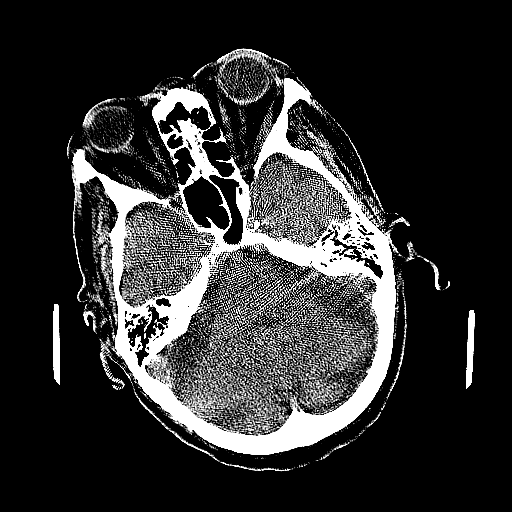
[im 36/96  brain]
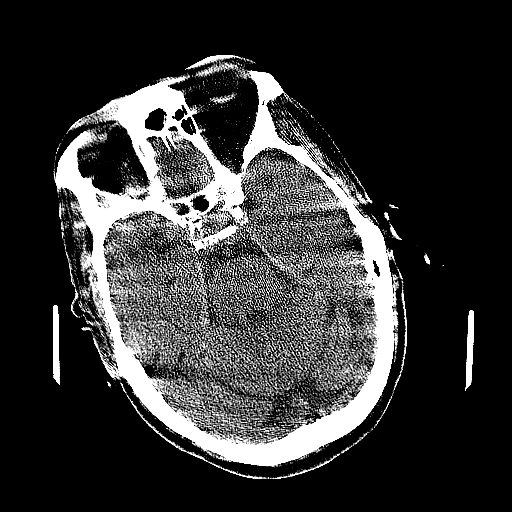
[im 36/96  bone]
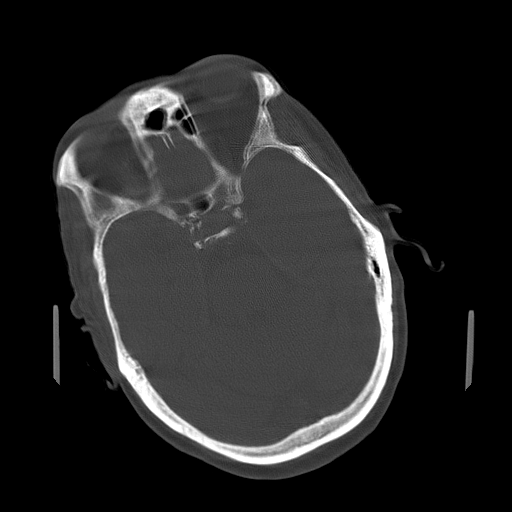
[im 41/96  brain]
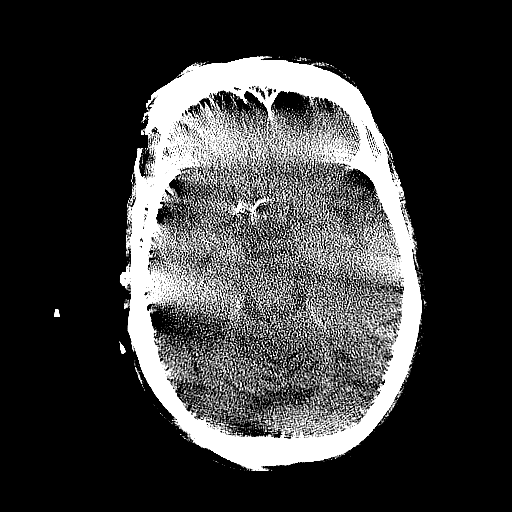
[im 51/96  brain]
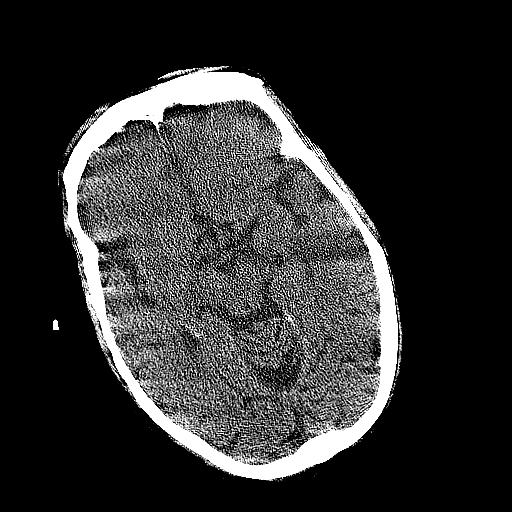
[im 56/96  brain]
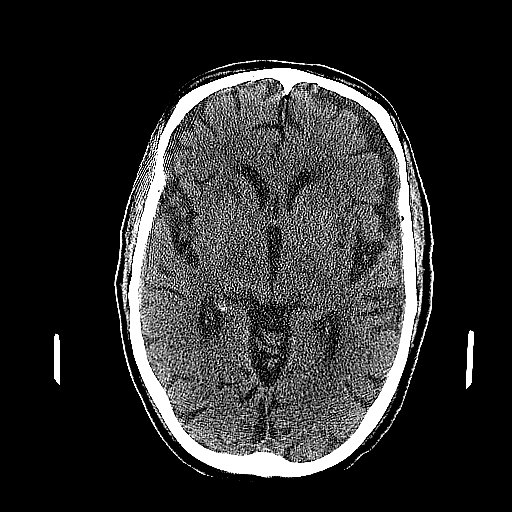
[im 61/96  brain]
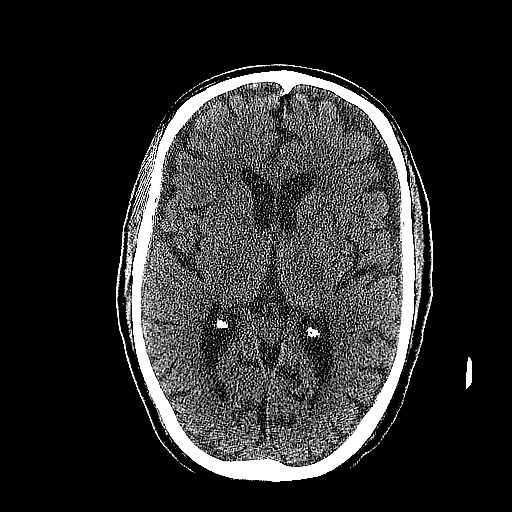
[im 61/96  bone]
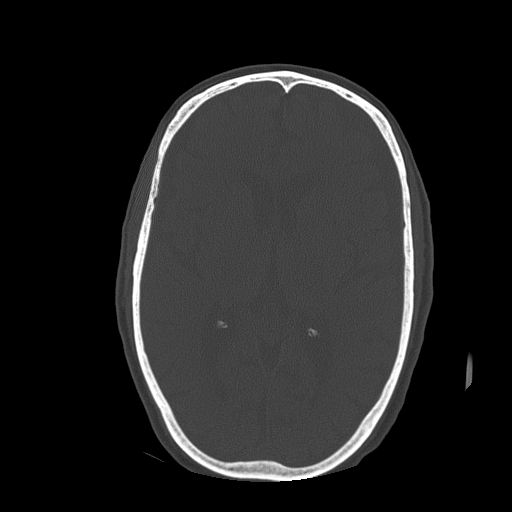
[im 71/96  brain]
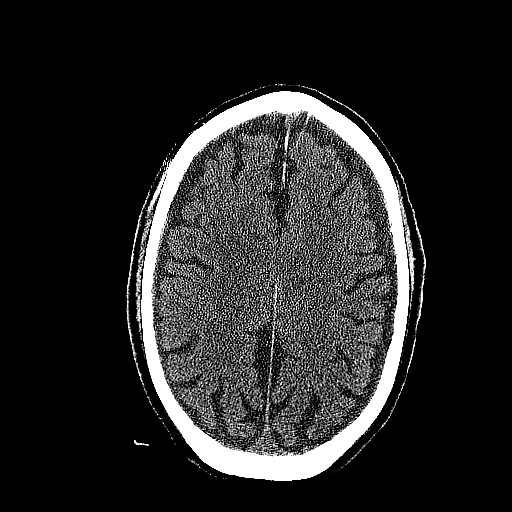
[im 76/96  brain]
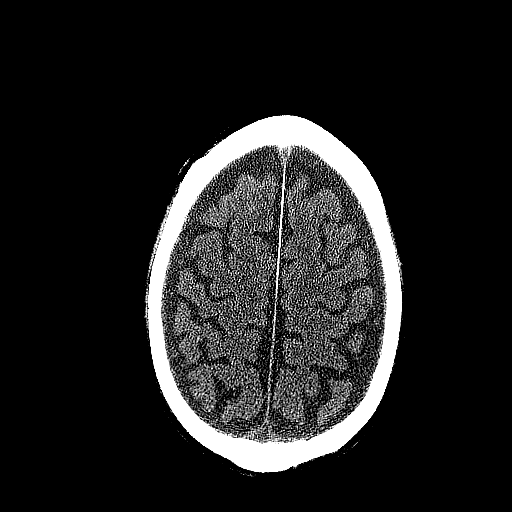
[im 81/96  brain]
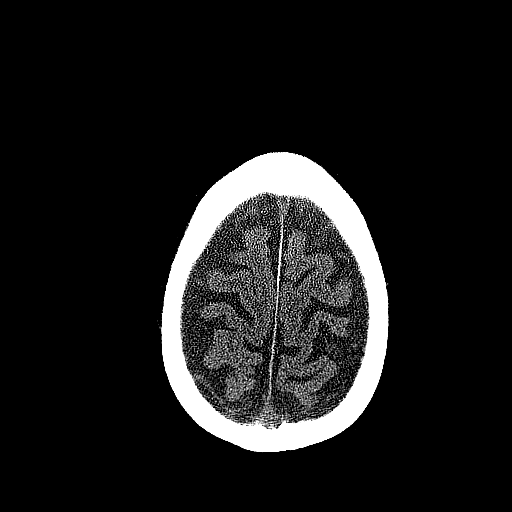
[im 91/96  brain]
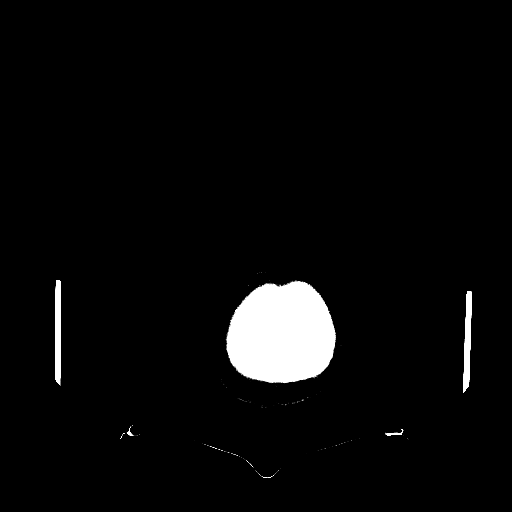
[im 91/96  bone]
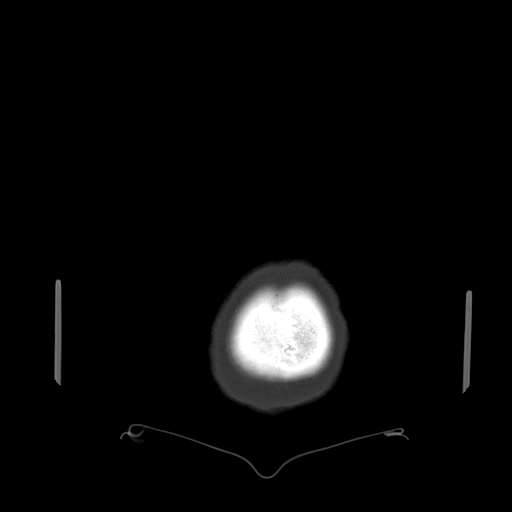

[13 of 30 positions shown; findings below may reference images not displayed]

FINDINGS: The examination is degraded secondary to patient motion artifact
necessitating additional images.  In particular, there is
suboptimal evaluation of the base of the skull.

Grossly unchanged findings of mild diffuse atrophy with stable
diffuse sulcal prominence.  Grossly unchanged mild centralized
volume loss with corresponding ex vacuo dilatation of the
ventricular system.  Scattered periventricular hypodensities
compatible with microvascular ischemic disease.  Old lacunar
infarcts within the bilateral basal ganglia.  Given the degradation
of the examination and extensive background parenchymal
abnormalities, there is no CT evidence of acute large territory
infarct.  No definite intraparenchymal or extra-axial mass or
hemorrhage.  Unchanged size configuration of the ventricles and
basilar cisterns.  No midline shift.

Minimal mucosal thickening within the left sphenoid sinus.  The
remaining paranasal sinuses and mastoid air cells are normally
aerated.  Regional soft tissues are normal.  No definite displaced
calvarial fracture.
IMPRESSION: 1.  Degraded examination without definite acute intracranial
process.  If clinical concern persists, further evaluation with
repeat head CT when the patient better able to tolerate the
examination is recommended.
2. Grossly stable findings of advanced atrophy and microvascular
ischemic disease.

CT CERVICAL SPINE
FINDINGS: C1 to the superior endplate of T2 is imaged.

There is increased mottled sclerosis most conspicuous involving the
T2 vertebral body with additional ill-defined areas of sclerosis
within the left lateral aspect of the C7 and C9 T1 vertebral
bodies, worrisome for osseous metastatic disease.

There is mottled sclerosis involving the posterior medial aspect of
several of the imaged superior ribs, most conspicuously involving
the posterior aspect of the left sided second rib.

There is a comminuted displaced likely pathologic fracture
involving the right lateral aspect of the arch of C1 with
associated posterior lateral displacement of the right side of the
arch of C1 and left lateral deviation of the left side of the arch
of C1.  These findings are associated with cranial migration of the
dens through the foramen magnum.

There is an approximate 0.8 x 0.6 cm displaced osseous fragment
seen adjacent to the base of the dens within the spinal canal
(image 14, series four).

There is an age indeterminate avulsion fracture involving the
spinous process of the T1 vertebral body.

Calcifications within the bilateral carotid bulbs.  Limited
visualization of the lung eighty suggests a right apical
approximately 1.3 x 0.9 cm nodule (image 53, series five).
IMPRESSION: 1.  Findings worrisome for extensive osseous metastatic disease to
the cervical spine, most conspicuously involving the T2 vertebral
body.
2.   Comminuted, displaced, likely pathologic fracture involving
primarily the right lateral ring of C1 with associated displacement
and cranial migration of the dens through the foramen magnum. There
is an approximately 0.8 cm displaced osseous fragment adjacent to
the base of the dens within the spinal canal (image 14, series
four).

3.  Age indeterminate avulsion fracture involving the spinous
process of the T1 vertebral body.

4.  Osseous metastatic disease involving the medial aspect of the
imaged bilateral superior ribs, most conspicuously involving the
posterior medial aspect of the left 2nd rib.

5.  Indeterminate approximately 1.3 cm nodule within the imaged
right lung apex.

Above findings discussed with Dr. Jaylon at 13 28.
